# Patient Record
Sex: Male | Born: 1960 | Race: White | Hispanic: No | Marital: Married | Smoking: Former smoker
Health system: Southern US, Community
[De-identification: ages and names within clinical notes are randomized; demographics above are authoritative.]

## PROBLEM LIST (undated history)

## (undated) DIAGNOSIS — D649 Anemia, unspecified: Secondary | ICD-10-CM

## (undated) DIAGNOSIS — A4902 Methicillin resistant Staphylococcus aureus infection, unspecified site: Secondary | ICD-10-CM

## (undated) DIAGNOSIS — F32A Depression, unspecified: Secondary | ICD-10-CM

## (undated) DIAGNOSIS — K219 Gastro-esophageal reflux disease without esophagitis: Secondary | ICD-10-CM

## (undated) DIAGNOSIS — G4730 Sleep apnea, unspecified: Secondary | ICD-10-CM

## (undated) DIAGNOSIS — E119 Type 2 diabetes mellitus without complications: Secondary | ICD-10-CM

## (undated) DIAGNOSIS — N189 Chronic kidney disease, unspecified: Secondary | ICD-10-CM

## (undated) DIAGNOSIS — F329 Major depressive disorder, single episode, unspecified: Secondary | ICD-10-CM

## (undated) DIAGNOSIS — I10 Essential (primary) hypertension: Secondary | ICD-10-CM

## (undated) DIAGNOSIS — J449 Chronic obstructive pulmonary disease, unspecified: Secondary | ICD-10-CM

## (undated) DIAGNOSIS — E785 Hyperlipidemia, unspecified: Secondary | ICD-10-CM

## (undated) HISTORY — DX: Essential (primary) hypertension: I10

## (undated) HISTORY — DX: Chronic kidney disease, unspecified: N18.9

## (undated) HISTORY — PX: CHOLECYSTECTOMY: SHX55

## (undated) HISTORY — PX: OTHER SURGICAL HISTORY: SHX169

## (undated) HISTORY — PX: TRACHEOSTOMY: SUR1362

## (undated) HISTORY — DX: Type 2 diabetes mellitus without complications: E11.9

## (undated) HISTORY — DX: Sleep apnea, unspecified: G47.30

## (undated) HISTORY — PX: EXPLORATORY LAPAROTOMY: SUR591

## (undated) HISTORY — DX: Anemia, unspecified: D64.9

## (undated) HISTORY — DX: Hyperlipidemia, unspecified: E78.5

## (undated) HISTORY — DX: Chronic obstructive pulmonary disease, unspecified: J44.9

---

## 2007-01-31 ENCOUNTER — Ambulatory Visit: Payer: Self-pay | Admitting: Gastroenterology

## 2009-11-01 ENCOUNTER — Ambulatory Visit: Payer: Self-pay | Admitting: Specialist

## 2009-11-18 ENCOUNTER — Ambulatory Visit: Payer: Self-pay | Admitting: Specialist

## 2009-12-07 ENCOUNTER — Ambulatory Visit: Payer: Self-pay | Admitting: Specialist

## 2010-12-15 ENCOUNTER — Ambulatory Visit: Payer: Self-pay | Admitting: Urology

## 2013-03-19 ENCOUNTER — Ambulatory Visit: Payer: Self-pay | Admitting: Nurse Practitioner

## 2013-04-23 ENCOUNTER — Ambulatory Visit: Payer: Self-pay | Admitting: Surgery

## 2013-04-30 ENCOUNTER — Inpatient Hospital Stay: Payer: Self-pay | Admitting: Surgery

## 2013-04-30 LAB — PRO B NATRIURETIC PEPTIDE: B-Type Natriuretic Peptide: 50 pg/mL (ref 0–125)

## 2013-04-30 LAB — TROPONIN I
Troponin-I: <0.02 ng/mL
Troponin-I: <0.02 ng/mL

## 2013-04-30 LAB — CK TOTAL AND CKMB (NOT AT ARMC): CK-MB: <0.5 ng/mL — ABNORMAL LOW (ref 0.5–3.6)

## 2013-05-01 LAB — HEPATIC FUNCTION PANEL A (ARMC)
Albumin: 2.8 g/dL — ABNORMAL LOW (ref 3.4–5.0)
Alkaline Phosphatase: 79 U/L (ref 50–136)
Bilirubin, Direct: 0.1 mg/dL (ref 0.00–0.20)
Bilirubin,Total: 0.3 mg/dL (ref 0.2–1.0)
SGPT (ALT): 43 U/L (ref 12–78)

## 2013-05-01 LAB — CK-MB: CK-MB: <0.5 ng/mL — ABNORMAL LOW (ref 0.5–3.6)

## 2013-05-01 LAB — CK: CK, Total: 38 U/L (ref 35–232)

## 2013-05-01 LAB — CBC WITH DIFFERENTIAL/PLATELET
Eosinophil #: 0 10*3/uL (ref 0.0–0.7)
Eosinophil %: 0 %
Lymphocyte %: 4.3 %
MCH: 27.4 pg (ref 26.0–34.0)
Monocyte #: 0.7 x10 3/mm (ref 0.2–1.0)
Neutrophil #: 19.3 10*3/uL — ABNORMAL HIGH (ref 1.4–6.5)
Neutrophil %: 92.1 %
Platelet: 265 10*3/uL (ref 150–440)
RBC: 4.28 10*6/uL — ABNORMAL LOW (ref 4.40–5.90)
RDW: 15 % — ABNORMAL HIGH (ref 11.5–14.5)
WBC: 21 10*3/uL — ABNORMAL HIGH (ref 3.8–10.6)

## 2013-05-01 LAB — BASIC METABOLIC PANEL
BUN: 31 mg/dL — ABNORMAL HIGH (ref 7–18)
Chloride: 108 mmol/L — ABNORMAL HIGH (ref 98–107)
Co2: 21 mmol/L (ref 21–32)
Creatinine: 1.93 mg/dL — ABNORMAL HIGH (ref 0.60–1.30)
Osmolality: 283 (ref 275–301)
Potassium: 5 mmol/L (ref 3.5–5.1)

## 2013-05-01 LAB — MAGNESIUM: Magnesium: 1.8 mg/dL

## 2013-05-02 LAB — CBC WITH DIFFERENTIAL/PLATELET
Eosinophil #: 0 10*3/uL (ref 0.0–0.7)
HCT: 32.4 % — ABNORMAL LOW (ref 40.0–52.0)
HGB: 10.8 g/dL — ABNORMAL LOW (ref 13.0–18.0)
Lymphocyte %: 6 %
MCH: 27.4 pg (ref 26.0–34.0)
MCHC: 33.3 g/dL (ref 32.0–36.0)
Monocyte #: 1.3 x10 3/mm — ABNORMAL HIGH (ref 0.2–1.0)
Neutrophil #: 16.2 10*3/uL — ABNORMAL HIGH (ref 1.4–6.5)
Platelet: 241 10*3/uL (ref 150–440)
RBC: 3.93 10*6/uL — ABNORMAL LOW (ref 4.40–5.90)
WBC: 18.7 10*3/uL — ABNORMAL HIGH (ref 3.8–10.6)

## 2013-05-02 LAB — BASIC METABOLIC PANEL
Anion Gap: 6 — ABNORMAL LOW (ref 7–16)
BUN: 36 mg/dL — ABNORMAL HIGH (ref 7–18)
Calcium, Total: 8 mg/dL — ABNORMAL LOW (ref 8.5–10.1)
Chloride: 108 mmol/L — ABNORMAL HIGH (ref 98–107)
Creatinine: 1.98 mg/dL — ABNORMAL HIGH (ref 0.60–1.30)
EGFR (African American): 44 — ABNORMAL LOW
EGFR (Non-African Amer.): 38 — ABNORMAL LOW
Glucose: 209 mg/dL — ABNORMAL HIGH (ref 65–99)

## 2013-05-02 LAB — MAGNESIUM: Magnesium: 2.1 mg/dL

## 2013-05-03 LAB — BASIC METABOLIC PANEL
Anion Gap: 6 — ABNORMAL LOW (ref 7–16)
BUN: 40 mg/dL — ABNORMAL HIGH (ref 7–18)
Calcium, Total: 8.5 mg/dL (ref 8.5–10.1)
Chloride: 109 mmol/L — ABNORMAL HIGH (ref 98–107)
Co2: 22 mmol/L (ref 21–32)
EGFR (Non-African Amer.): 55 — ABNORMAL LOW
Glucose: 130 mg/dL — ABNORMAL HIGH (ref 65–99)
Potassium: 4.5 mmol/L (ref 3.5–5.1)
Sodium: 137 mmol/L (ref 136–145)

## 2013-05-03 LAB — PHOSPHORUS: Phosphorus: 2.7 mg/dL (ref 2.5–4.9)

## 2013-05-03 LAB — MAGNESIUM: Magnesium: 2.2 mg/dL

## 2013-05-04 LAB — CBC WITH DIFFERENTIAL/PLATELET
Basophil #: 0.1 10*3/uL (ref 0.0–0.1)
Eosinophil #: 0 10*3/uL (ref 0.0–0.7)
Eosinophil %: 0 %
HCT: 32.4 % — ABNORMAL LOW (ref 40.0–52.0)
Lymphocyte #: 1.1 10*3/uL (ref 1.0–3.6)
MCH: 27.5 pg (ref 26.0–34.0)
MCHC: 33.8 g/dL (ref 32.0–36.0)
MCV: 81 fL (ref 80–100)
Monocyte #: 1.6 x10 3/mm — ABNORMAL HIGH (ref 0.2–1.0)
Neutrophil %: 87.5 %
Platelet: 250 10*3/uL (ref 150–440)
RDW: 15.3 % — ABNORMAL HIGH (ref 11.5–14.5)
WBC: 22.8 10*3/uL — ABNORMAL HIGH (ref 3.8–10.6)

## 2013-05-04 LAB — HEPATIC FUNCTION PANEL A (ARMC)
Albumin: 2.4 g/dL — ABNORMAL LOW (ref 3.4–5.0)
Alkaline Phosphatase: 60 U/L (ref 50–136)
Bilirubin, Direct: 0.1 mg/dL (ref 0.00–0.20)
Bilirubin,Total: 0.3 mg/dL (ref 0.2–1.0)
SGOT(AST): 28 U/L (ref 15–37)
Total Protein: 6.5 g/dL (ref 6.4–8.2)

## 2013-05-04 LAB — BASIC METABOLIC PANEL
BUN: 45 mg/dL — ABNORMAL HIGH (ref 7–18)
Calcium, Total: 8.5 mg/dL (ref 8.5–10.1)
Chloride: 108 mmol/L — ABNORMAL HIGH (ref 98–107)
Co2: 24 mmol/L (ref 21–32)
Creatinine: 1.44 mg/dL — ABNORMAL HIGH (ref 0.60–1.30)
Glucose: 188 mg/dL — ABNORMAL HIGH (ref 65–99)
Osmolality: 292 (ref 275–301)
Potassium: 4.8 mmol/L (ref 3.5–5.1)
Sodium: 138 mmol/L (ref 136–145)

## 2013-05-05 LAB — CBC WITH DIFFERENTIAL/PLATELET
Basophil #: 0.1 10*3/uL (ref 0.0–0.1)
Basophil %: 0.5 %
Eosinophil #: 0.2 10*3/uL (ref 0.0–0.7)
Eosinophil %: 1.4 %
HCT: 32 % — ABNORMAL LOW (ref 40.0–52.0)
Lymphocyte #: 2.6 10*3/uL (ref 1.0–3.6)
Lymphocyte %: 15.8 %
MCH: 27.4 pg (ref 26.0–34.0)
MCHC: 33.7 g/dL (ref 32.0–36.0)
MCV: 81 fL (ref 80–100)
Monocyte #: 2 x10 3/mm — ABNORMAL HIGH (ref 0.2–1.0)
Neutrophil #: 11.7 10*3/uL — ABNORMAL HIGH (ref 1.4–6.5)
Platelet: 257 10*3/uL (ref 150–440)
RBC: 3.94 10*6/uL — ABNORMAL LOW (ref 4.40–5.90)
RDW: 15.5 % — ABNORMAL HIGH (ref 11.5–14.5)

## 2013-05-06 LAB — CBC WITH DIFFERENTIAL/PLATELET
Basophil %: 0.7 %
Eosinophil #: 0.4 10*3/uL (ref 0.0–0.7)
Eosinophil %: 3 %
HCT: 31.9 % — ABNORMAL LOW (ref 40.0–52.0)
MCH: 27.5 pg (ref 26.0–34.0)
MCHC: 34.2 g/dL (ref 32.0–36.0)
MCV: 81 fL (ref 80–100)
Monocyte %: 13.4 %
Neutrophil #: 9.3 10*3/uL — ABNORMAL HIGH (ref 1.4–6.5)
Neutrophil %: 62.3 %
Platelet: 267 10*3/uL (ref 150–440)
RBC: 3.96 10*6/uL — ABNORMAL LOW (ref 4.40–5.90)
WBC: 14.9 10*3/uL — ABNORMAL HIGH (ref 3.8–10.6)

## 2013-05-06 LAB — COMPREHENSIVE METABOLIC PANEL
Albumin: 2.2 g/dL — ABNORMAL LOW (ref 3.4–5.0)
Alkaline Phosphatase: 79 U/L (ref 50–136)
Anion Gap: 8 (ref 7–16)
BUN: 59 mg/dL — ABNORMAL HIGH (ref 7–18)
Bilirubin,Total: 0.5 mg/dL (ref 0.2–1.0)
Chloride: 106 mmol/L (ref 98–107)
Creatinine: 1.62 mg/dL — ABNORMAL HIGH (ref 0.60–1.30)
EGFR (African American): 56 — ABNORMAL LOW
EGFR (Non-African Amer.): 48 — ABNORMAL LOW
Glucose: 139 mg/dL — ABNORMAL HIGH (ref 65–99)
Potassium: 3.9 mmol/L (ref 3.5–5.1)
SGOT(AST): 42 U/L — ABNORMAL HIGH (ref 15–37)
Total Protein: 6.5 g/dL (ref 6.4–8.2)

## 2013-05-07 LAB — CBC WITH DIFFERENTIAL/PLATELET
Basophil #: 0.1 10*3/uL (ref 0.0–0.1)
Basophil %: 0.7 %
Eosinophil %: 3.1 %
HCT: 33.6 % — ABNORMAL LOW (ref 40.0–52.0)
HGB: 11.2 g/dL — ABNORMAL LOW (ref 13.0–18.0)
Lymphocyte #: 2.9 10*3/uL (ref 1.0–3.6)
Lymphocyte %: 20 %
MCH: 27 pg (ref 26.0–34.0)
MCHC: 33.4 g/dL (ref 32.0–36.0)
MCV: 81 fL (ref 80–100)
Monocyte #: 1.5 x10 3/mm — ABNORMAL HIGH (ref 0.2–1.0)
Neutrophil #: 9.4 10*3/uL — ABNORMAL HIGH (ref 1.4–6.5)
Neutrophil %: 65.6 %
RBC: 4.17 10*6/uL — ABNORMAL LOW (ref 4.40–5.90)
RDW: 15.1 % — ABNORMAL HIGH (ref 11.5–14.5)
WBC: 14.2 10*3/uL — ABNORMAL HIGH (ref 3.8–10.6)

## 2013-05-07 LAB — BASIC METABOLIC PANEL
BUN: 53 mg/dL — ABNORMAL HIGH (ref 7–18)
Calcium, Total: 8.7 mg/dL (ref 8.5–10.1)
Creatinine: 1.28 mg/dL (ref 0.60–1.30)
EGFR (African American): >60
Glucose: 125 mg/dL — ABNORMAL HIGH (ref 65–99)
Osmolality: 299 (ref 275–301)
Sodium: 142 mmol/L (ref 136–145)

## 2013-05-08 LAB — CBC WITH DIFFERENTIAL/PLATELET
Basophil #: 0.1 10*3/uL (ref 0.0–0.1)
Basophil %: 0.6 %
Eosinophil #: 0.4 10*3/uL (ref 0.0–0.7)
Eosinophil %: 3 %
HCT: 34.5 % — ABNORMAL LOW (ref 40.0–52.0)
HGB: 11.7 g/dL — ABNORMAL LOW (ref 13.0–18.0)
MCH: 27.1 pg (ref 26.0–34.0)
MCHC: 33.7 g/dL (ref 32.0–36.0)
MCV: 80 fL (ref 80–100)
Neutrophil %: 59.4 %
RDW: 14.6 % — ABNORMAL HIGH (ref 11.5–14.5)
WBC: 15 10*3/uL — ABNORMAL HIGH (ref 3.8–10.6)

## 2013-05-08 LAB — BASIC METABOLIC PANEL
Anion Gap: 8 (ref 7–16)
BUN: 55 mg/dL — ABNORMAL HIGH (ref 7–18)
Calcium, Total: 9 mg/dL (ref 8.5–10.1)
Chloride: 105 mmol/L (ref 98–107)
Co2: 27 mmol/L (ref 21–32)
EGFR (African American): >60
EGFR (Non-African Amer.): 52 — ABNORMAL LOW
Osmolality: 297 (ref 275–301)
Sodium: 140 mmol/L (ref 136–145)

## 2013-05-08 LAB — PHOSPHORUS: Phosphorus: 4.5 mg/dL (ref 2.5–4.9)

## 2013-05-09 LAB — BASIC METABOLIC PANEL
Anion Gap: 6 — ABNORMAL LOW (ref 7–16)
BUN: 68 mg/dL — ABNORMAL HIGH (ref 7–18)
Calcium, Total: 9 mg/dL (ref 8.5–10.1)
Chloride: 104 mmol/L (ref 98–107)
Co2: 28 mmol/L (ref 21–32)
Creatinine: 1.49 mg/dL — ABNORMAL HIGH (ref 0.60–1.30)
EGFR (Non-African Amer.): 53 — ABNORMAL LOW
Glucose: 211 mg/dL — ABNORMAL HIGH (ref 65–99)
Sodium: 138 mmol/L (ref 136–145)

## 2013-05-09 LAB — CBC WITH DIFFERENTIAL/PLATELET
Basophil #: 0.1 10*3/uL (ref 0.0–0.1)
Eosinophil #: 0.2 10*3/uL (ref 0.0–0.7)
HCT: 34.4 % — ABNORMAL LOW (ref 40.0–52.0)
MCH: 26.9 pg (ref 26.0–34.0)
MCV: 81 fL (ref 80–100)
Monocyte %: 7.5 %
Neutrophil #: 12.4 10*3/uL — ABNORMAL HIGH (ref 1.4–6.5)
Platelet: 281 10*3/uL (ref 150–440)
RBC: 4.26 10*6/uL — ABNORMAL LOW (ref 4.40–5.90)
RDW: 14.5 % (ref 11.5–14.5)
WBC: 16 10*3/uL — ABNORMAL HIGH (ref 3.8–10.6)

## 2013-05-10 LAB — CBC WITH DIFFERENTIAL/PLATELET
HCT: 33.2 % — ABNORMAL LOW (ref 40.0–52.0)
HGB: 11.2 g/dL — ABNORMAL LOW (ref 13.0–18.0)
Lymphocyte #: 1.7 10*3/uL (ref 1.0–3.6)
MCH: 27.3 pg (ref 26.0–34.0)
MCHC: 33.7 g/dL (ref 32.0–36.0)
MCV: 81 fL (ref 80–100)
Monocyte #: 1 x10 3/mm (ref 0.2–1.0)
Monocyte %: 6.4 %
Neutrophil #: 13.1 10*3/uL — ABNORMAL HIGH (ref 1.4–6.5)
Neutrophil %: 80.3 %
RDW: 14.7 % — ABNORMAL HIGH (ref 11.5–14.5)
WBC: 16.3 10*3/uL — ABNORMAL HIGH (ref 3.8–10.6)

## 2013-05-10 LAB — BASIC METABOLIC PANEL
Chloride: 106 mmol/L (ref 98–107)
Co2: 26 mmol/L (ref 21–32)
Creatinine: 1.45 mg/dL — ABNORMAL HIGH (ref 0.60–1.30)
EGFR (African American): >60
Osmolality: 303 (ref 275–301)
Potassium: 4.1 mmol/L (ref 3.5–5.1)
Sodium: 138 mmol/L (ref 136–145)

## 2013-05-10 LAB — EXPECTORATED SPUTUM ASSESSMENT W GRAM STAIN, RFLX TO RESP C

## 2013-05-10 LAB — PHOSPHORUS: Phosphorus: 3 mg/dL (ref 2.5–4.9)

## 2013-05-11 LAB — BASIC METABOLIC PANEL
Anion Gap: 6 — ABNORMAL LOW (ref 7–16)
BUN: 44 mg/dL — ABNORMAL HIGH (ref 7–18)
Calcium, Total: 8.3 mg/dL — ABNORMAL LOW (ref 8.5–10.1)
Chloride: 106 mmol/L (ref 98–107)
Co2: 26 mmol/L (ref 21–32)
EGFR (African American): >60
EGFR (Non-African Amer.): >60
Glucose: 229 mg/dL — ABNORMAL HIGH (ref 65–99)
Potassium: 3.8 mmol/L (ref 3.5–5.1)
Sodium: 138 mmol/L (ref 136–145)

## 2013-05-11 LAB — CBC WITH DIFFERENTIAL/PLATELET
Basophil #: 0.1 10*3/uL (ref 0.0–0.1)
Basophil %: 0.8 %
Eosinophil #: 0.5 10*3/uL (ref 0.0–0.7)
HGB: 11 g/dL — ABNORMAL LOW (ref 13.0–18.0)
Lymphocyte #: 2.1 10*3/uL (ref 1.0–3.6)
MCH: 27 pg (ref 26.0–34.0)
MCV: 81 fL (ref 80–100)
Monocyte %: 6.7 %
Neutrophil #: 13.2 10*3/uL — ABNORMAL HIGH (ref 1.4–6.5)
Neutrophil %: 77.3 %
RBC: 4.08 10*6/uL — ABNORMAL LOW (ref 4.40–5.90)
WBC: 17.1 10*3/uL — ABNORMAL HIGH (ref 3.8–10.6)

## 2013-05-11 LAB — MAGNESIUM: Magnesium: 1.8 mg/dL

## 2013-05-11 LAB — TRIGLYCERIDES: Triglycerides: 511 mg/dL — ABNORMAL HIGH (ref 0–200)

## 2013-05-12 LAB — CREATININE, SERUM: EGFR (Non-African Amer.): >60

## 2013-05-12 LAB — CALCIUM: Calcium, Total: 8.5 mg/dL (ref 8.5–10.1)

## 2013-05-12 LAB — MAGNESIUM: Magnesium: 1.7 mg/dL — ABNORMAL LOW

## 2013-05-13 LAB — CBC WITH DIFFERENTIAL/PLATELET
Basophil #: 0.1 10*3/uL (ref 0.0–0.1)
Basophil %: 0.7 %
Eosinophil #: 0.4 10*3/uL (ref 0.0–0.7)
Eosinophil %: 2.3 %
HCT: 30.8 % — ABNORMAL LOW (ref 40.0–52.0)
HGB: 10.1 g/dL — ABNORMAL LOW (ref 13.0–18.0)
Lymphocyte #: 2.8 10*3/uL (ref 1.0–3.6)
Lymphocyte %: 16.9 %
MCH: 26.8 pg (ref 26.0–34.0)
MCHC: 32.9 g/dL (ref 32.0–36.0)
MCV: 82 fL (ref 80–100)
Monocyte #: 1.3 x10 3/mm — ABNORMAL HIGH (ref 0.2–1.0)
Monocyte %: 7.6 %
Neutrophil #: 12.1 10*3/uL — ABNORMAL HIGH (ref 1.4–6.5)
Neutrophil %: 72.5 %
Platelet: 266 10*3/uL (ref 150–440)
RBC: 3.78 10*6/uL — ABNORMAL LOW (ref 4.40–5.90)
RDW: 14.9 % — ABNORMAL HIGH (ref 11.5–14.5)
WBC: 16.7 10*3/uL — ABNORMAL HIGH (ref 3.8–10.6)

## 2013-05-13 LAB — PHOSPHORUS: Phosphorus: 3.8 mg/dL (ref 2.5–4.9)

## 2013-05-13 LAB — MAGNESIUM: Magnesium: 2 mg/dL

## 2013-05-13 LAB — BASIC METABOLIC PANEL
BUN: 41 mg/dL — ABNORMAL HIGH (ref 7–18)
Calcium, Total: 8.6 mg/dL (ref 8.5–10.1)
Chloride: 108 mmol/L — ABNORMAL HIGH (ref 98–107)
Co2: 24 mmol/L (ref 21–32)
Creatinine: 1.15 mg/dL (ref 0.60–1.30)
EGFR (African American): >60
Glucose: 154 mg/dL — ABNORMAL HIGH (ref 65–99)
Sodium: 140 mmol/L (ref 136–145)

## 2013-05-13 LAB — VANCOMYCIN, TROUGH: Vancomycin, Trough: 16 ug/mL (ref 10–20)

## 2013-05-14 LAB — BASIC METABOLIC PANEL
Anion Gap: 6 — ABNORMAL LOW (ref 7–16)
BUN: 37 mg/dL — ABNORMAL HIGH (ref 7–18)
Calcium, Total: 8.6 mg/dL (ref 8.5–10.1)
Chloride: 107 mmol/L (ref 98–107)
Co2: 27 mmol/L (ref 21–32)
Creatinine: 1.15 mg/dL (ref 0.60–1.30)
EGFR (African American): >60
EGFR (Non-African Amer.): >60
Osmolality: 291 (ref 275–301)
Potassium: 3.7 mmol/L (ref 3.5–5.1)
Sodium: 140 mmol/L (ref 136–145)

## 2013-05-14 LAB — MAGNESIUM: Magnesium: 1.8 mg/dL

## 2013-05-15 LAB — BASIC METABOLIC PANEL
Anion Gap: 10 (ref 7–16)
BUN: 34 mg/dL — ABNORMAL HIGH (ref 7–18)
Calcium, Total: 9 mg/dL (ref 8.5–10.1)
Creatinine: 1.24 mg/dL (ref 0.60–1.30)
EGFR (African American): >60
Osmolality: 307 (ref 275–301)
Potassium: 3.5 mmol/L (ref 3.5–5.1)
Sodium: 141 mmol/L (ref 136–145)

## 2013-05-15 LAB — MAGNESIUM: Magnesium: 2 mg/dL

## 2013-05-15 LAB — PHOSPHORUS: Phosphorus: 3 mg/dL (ref 2.5–4.9)

## 2013-05-16 LAB — BASIC METABOLIC PANEL
Anion Gap: 7 (ref 7–16)
Calcium, Total: 9.1 mg/dL (ref 8.5–10.1)
Co2: 27 mmol/L (ref 21–32)
Creatinine: 1.26 mg/dL (ref 0.60–1.30)
EGFR (Non-African Amer.): >60
Glucose: 248 mg/dL — ABNORMAL HIGH (ref 65–99)
Osmolality: 304 (ref 275–301)

## 2013-05-16 LAB — PHOSPHORUS: Phosphorus: 2.2 mg/dL — ABNORMAL LOW (ref 2.5–4.9)

## 2013-05-16 LAB — MAGNESIUM: Magnesium: 2.1 mg/dL

## 2013-05-16 LAB — VANCOMYCIN, TROUGH: Vancomycin, Trough: 19 ug/mL (ref 10–20)

## 2013-05-17 LAB — CBC WITH DIFFERENTIAL/PLATELET
Eosinophil #: 0.2 10*3/uL (ref 0.0–0.7)
HCT: 31.3 % — ABNORMAL LOW (ref 40.0–52.0)
HGB: 10.2 g/dL — ABNORMAL LOW (ref 13.0–18.0)
Lymphocyte #: 2 10*3/uL (ref 1.0–3.6)
MCH: 26.8 pg (ref 26.0–34.0)
MCHC: 32.5 g/dL (ref 32.0–36.0)
MCV: 83 fL (ref 80–100)
Monocyte %: 8.3 %
Neutrophil %: 75.5 %

## 2013-05-17 LAB — CALCIUM: Calcium, Total: 9 mg/dL (ref 8.5–10.1)

## 2013-05-17 LAB — PHOSPHORUS: Phosphorus: 3.2 mg/dL (ref 2.5–4.9)

## 2013-05-17 LAB — SODIUM: Sodium: 146 mmol/L — ABNORMAL HIGH (ref 136–145)

## 2013-05-18 LAB — CALCIUM: Calcium, Total: 8.8 mg/dL (ref 8.5–10.1)

## 2013-05-18 LAB — CREATININE, SERUM
Creatinine: 1.25 mg/dL (ref 0.60–1.30)
EGFR (African American): >60
EGFR (Non-African Amer.): >60

## 2013-05-18 LAB — SODIUM: Sodium: 145 mmol/L (ref 136–145)

## 2013-05-18 LAB — SEDIMENTATION RATE: Erythrocyte Sed Rate: 106 mm/hr — ABNORMAL HIGH (ref 0–20)

## 2013-05-18 LAB — FOLATE: Folic Acid: 19.8 ng/mL (ref 3.1–100.0)

## 2013-05-19 LAB — CBC WITH DIFFERENTIAL/PLATELET
Eosinophil #: 0.5 10*3/uL (ref 0.0–0.7)
HCT: 31.2 % — ABNORMAL LOW (ref 40.0–52.0)
HGB: 10.6 g/dL — ABNORMAL LOW (ref 13.0–18.0)
Lymphocyte #: 2.2 10*3/uL (ref 1.0–3.6)
MCH: 27.7 pg (ref 26.0–34.0)
MCHC: 34.1 g/dL (ref 32.0–36.0)
MCV: 81 fL (ref 80–100)
Neutrophil %: 74.2 %
Platelet: 238 10*3/uL (ref 150–440)
RDW: 15 % — ABNORMAL HIGH (ref 11.5–14.5)
WBC: 14.2 10*3/uL — ABNORMAL HIGH (ref 3.8–10.6)

## 2013-05-19 LAB — BASIC METABOLIC PANEL
Anion Gap: 7 (ref 7–16)
BUN: 41 mg/dL — ABNORMAL HIGH (ref 7–18)
Calcium, Total: 9 mg/dL (ref 8.5–10.1)
Chloride: 108 mmol/L — ABNORMAL HIGH (ref 98–107)
Co2: 27 mmol/L (ref 21–32)
Creatinine: 1.19 mg/dL (ref 0.60–1.30)
EGFR (African American): >60
EGFR (Non-African Amer.): >60
Osmolality: 293 (ref 275–301)
Sodium: 142 mmol/L (ref 136–145)

## 2013-05-19 LAB — PHOSPHORUS: Phosphorus: 3.2 mg/dL (ref 2.5–4.9)

## 2013-05-19 LAB — MAGNESIUM: Magnesium: 2 mg/dL

## 2013-05-20 LAB — HEPATIC FUNCTION PANEL A (ARMC)
Albumin: 1.9 g/dL — ABNORMAL LOW (ref 3.4–5.0)
Alkaline Phosphatase: 91 U/L (ref 50–136)
Bilirubin,Total: 0.5 mg/dL (ref 0.2–1.0)
SGOT(AST): 12 U/L — ABNORMAL LOW (ref 15–37)

## 2013-05-20 LAB — CBC WITH DIFFERENTIAL/PLATELET
Basophil #: 0.1 10*3/uL (ref 0.0–0.1)
Basophil %: 0.9 %
Eosinophil %: 2.7 %
Monocyte %: 8 %
Neutrophil #: 9.8 10*3/uL — ABNORMAL HIGH (ref 1.4–6.5)
RBC: 3.8 10*6/uL — ABNORMAL LOW (ref 4.40–5.90)
WBC: 13.2 10*3/uL — ABNORMAL HIGH (ref 3.8–10.6)

## 2013-05-20 LAB — BASIC METABOLIC PANEL
Anion Gap: 6 — ABNORMAL LOW (ref 7–16)
BUN: 38 mg/dL — ABNORMAL HIGH (ref 7–18)
Chloride: 104 mmol/L (ref 98–107)
Creatinine: 1.17 mg/dL (ref 0.60–1.30)
EGFR (African American): >60
EGFR (Non-African Amer.): >60
Glucose: 150 mg/dL — ABNORMAL HIGH (ref 65–99)
Osmolality: 289 (ref 275–301)
Potassium: 3.5 mmol/L (ref 3.5–5.1)

## 2013-05-20 LAB — PHOSPHORUS: Phosphorus: 3.6 mg/dL (ref 2.5–4.9)

## 2013-05-20 LAB — MAGNESIUM: Magnesium: 2 mg/dL

## 2013-05-21 LAB — CBC WITH DIFFERENTIAL/PLATELET
Basophil %: 0.8 %
HGB: 9.3 g/dL — ABNORMAL LOW (ref 13.0–18.0)
Lymphocyte #: 2.5 10*3/uL (ref 1.0–3.6)
Lymphocyte %: 13.7 %
MCH: 27.2 pg (ref 26.0–34.0)
MCHC: 33.3 g/dL (ref 32.0–36.0)
Neutrophil #: 13.6 10*3/uL — ABNORMAL HIGH (ref 1.4–6.5)
Neutrophil %: 74.2 %
WBC: 18.3 10*3/uL — ABNORMAL HIGH (ref 3.8–10.6)

## 2013-05-21 LAB — URINE CULTURE

## 2013-05-22 LAB — CBC WITH DIFFERENTIAL/PLATELET
Basophil #: 0.2 10*3/uL — ABNORMAL HIGH (ref 0.0–0.1)
Basophil %: 0.9 %
Eosinophil #: 0.3 10*3/uL (ref 0.0–0.7)
Lymphocyte %: 9.6 %
MCH: 27.1 pg (ref 26.0–34.0)
MCV: 82 fL (ref 80–100)
Monocyte #: 1.9 x10 3/mm — ABNORMAL HIGH (ref 0.2–1.0)
Monocyte %: 10.8 %
RDW: 15.2 % — ABNORMAL HIGH (ref 11.5–14.5)
WBC: 17.2 10*3/uL — ABNORMAL HIGH (ref 3.8–10.6)

## 2013-05-22 LAB — EXPECTORATED SPUTUM ASSESSMENT W GRAM STAIN, RFLX TO RESP C

## 2013-05-22 LAB — BASIC METABOLIC PANEL
Anion Gap: 9 (ref 7–16)
Anion Gap: 11 (ref 7–16)
Calcium, Total: 8.3 mg/dL — ABNORMAL LOW (ref 8.5–10.1)
Calcium, Total: 7.2 mg/dL — ABNORMAL LOW (ref 8.5–10.1)
Chloride: 100 mmol/L (ref 98–107)
Chloride: 91 mmol/L — ABNORMAL LOW (ref 98–107)
Co2: 25 mmol/L (ref 21–32)
Co2: 24 mmol/L (ref 21–32)
Creatinine: 1.79 mg/dL — ABNORMAL HIGH (ref 0.60–1.30)
Creatinine: 1.7 mg/dL — ABNORMAL HIGH (ref 0.60–1.30)
EGFR (African American): 49 — ABNORMAL LOW
EGFR (African American): 53 — ABNORMAL LOW
EGFR (Non-African Amer.): 43 — ABNORMAL LOW
Glucose: 317 mg/dL — ABNORMAL HIGH (ref 65–99)
Osmolality: 282 (ref 275–301)
Potassium: 3.7 mmol/L (ref 3.5–5.1)
Sodium: 134 mmol/L — ABNORMAL LOW (ref 136–145)
Sodium: 126 mmol/L — ABNORMAL LOW (ref 136–145)

## 2013-05-22 LAB — PHOSPHORUS: Phosphorus: 5.4 mg/dL — ABNORMAL HIGH (ref 2.5–4.9)

## 2013-05-22 LAB — CULTURE, BLOOD (SINGLE)

## 2013-05-22 LAB — TRIGLYCERIDES: Triglycerides: 264 mg/dL — ABNORMAL HIGH (ref 0–200)

## 2013-05-23 LAB — CBC WITH DIFFERENTIAL/PLATELET
Basophil %: 1.1 %
Eosinophil %: 4.6 %
HCT: 27.9 % — ABNORMAL LOW (ref 40.0–52.0)
HGB: 9.3 g/dL — ABNORMAL LOW (ref 13.0–18.0)
Lymphocyte #: 2.5 10*3/uL (ref 1.0–3.6)
Lymphocyte %: 19.3 %
MCH: 27 pg (ref 26.0–34.0)
MCHC: 33.4 g/dL (ref 32.0–36.0)
MCV: 81 fL (ref 80–100)
Monocyte #: 1.4 x10 3/mm — ABNORMAL HIGH (ref 0.2–1.0)
Monocyte %: 10.9 %
Neutrophil #: 8.2 10*3/uL — ABNORMAL HIGH (ref 1.4–6.5)
Neutrophil %: 64.1 %
Platelet: 286 10*3/uL (ref 150–440)
RBC: 3.44 10*6/uL — ABNORMAL LOW (ref 4.40–5.90)
RDW: 15 % — ABNORMAL HIGH (ref 11.5–14.5)

## 2013-05-23 LAB — BASIC METABOLIC PANEL
Anion Gap: 8 (ref 7–16)
Calcium, Total: 8.1 mg/dL — ABNORMAL LOW (ref 8.5–10.1)
Chloride: 102 mmol/L (ref 98–107)
EGFR (African American): 57 — ABNORMAL LOW
EGFR (Non-African Amer.): 49 — ABNORMAL LOW
Osmolality: 298 (ref 275–301)
Potassium: 3.9 mmol/L (ref 3.5–5.1)
Sodium: 136 mmol/L (ref 136–145)

## 2013-05-23 LAB — EXPECTORATED SPUTUM ASSESSMENT W GRAM STAIN, RFLX TO RESP C

## 2013-05-23 LAB — PHOSPHORUS: Phosphorus: 3.7 mg/dL (ref 2.5–4.9)

## 2013-05-24 LAB — CBC WITH DIFFERENTIAL/PLATELET
Basophil #: 0.2 10*3/uL — ABNORMAL HIGH (ref 0.0–0.1)
Basophil %: 1.5 %
Eosinophil %: 6.1 %
HCT: 25.1 % — ABNORMAL LOW (ref 40.0–52.0)
HGB: 8.4 g/dL — ABNORMAL LOW (ref 13.0–18.0)
Lymphocyte #: 2.3 10*3/uL (ref 1.0–3.6)
Lymphocyte %: 21.6 %
MCH: 27.3 pg (ref 26.0–34.0)
MCV: 81 fL (ref 80–100)
Monocyte %: 10.5 %
Platelet: 244 10*3/uL (ref 150–440)
RBC: 3.1 10*6/uL — ABNORMAL LOW (ref 4.40–5.90)
RDW: 15.5 % — ABNORMAL HIGH (ref 11.5–14.5)
WBC: 10.6 10*3/uL (ref 3.8–10.6)

## 2013-05-24 LAB — MAGNESIUM: Magnesium: 2.2 mg/dL

## 2013-05-24 LAB — BASIC METABOLIC PANEL
Anion Gap: 7 (ref 7–16)
BUN: 57 mg/dL — ABNORMAL HIGH (ref 7–18)
Creatinine: 1.43 mg/dL — ABNORMAL HIGH (ref 0.60–1.30)
EGFR (Non-African Amer.): 56 — ABNORMAL LOW

## 2013-05-24 LAB — CALCIUM: Calcium, Total: 8.4 mg/dL — ABNORMAL LOW (ref 8.5–10.1)

## 2013-05-24 LAB — POTASSIUM: Potassium: 4.3 mmol/L (ref 3.5–5.1)

## 2013-05-25 LAB — VANCOMYCIN, TROUGH: Vancomycin, Trough: 25 ug/mL (ref 10–20)

## 2013-05-26 ENCOUNTER — Ambulatory Visit (HOSPITAL_COMMUNITY)
Admission: AD | Admit: 2013-05-26 | Discharge: 2013-05-26 | Disposition: A | Discharge status: Home / Self Care | Payer: BC Managed Care – PPO | Source: Other Acute Inpatient Hospital | Attending: Internal Medicine | Admitting: Internal Medicine

## 2013-05-26 DIAGNOSIS — J9600 Acute respiratory failure, unspecified whether with hypoxia or hypercapnia: Secondary | ICD-10-CM | POA: Insufficient documentation

## 2013-05-26 DIAGNOSIS — Z9911 Dependence on respirator [ventilator] status: Secondary | ICD-10-CM | POA: Insufficient documentation

## 2013-05-26 LAB — BASIC METABOLIC PANEL
Anion Gap: 5 — ABNORMAL LOW (ref 7–16)
EGFR (Non-African Amer.): >60
Glucose: 96 mg/dL (ref 65–99)
Osmolality: 288 (ref 275–301)
Potassium: 4.3 mmol/L (ref 3.5–5.1)
Sodium: 141 mmol/L (ref 136–145)

## 2013-05-26 LAB — PHOSPHORUS: Phosphorus: 4.6 mg/dL (ref 2.5–4.9)

## 2013-05-26 LAB — MAGNESIUM: Magnesium: 1.9 mg/dL

## 2013-05-26 LAB — HEMOGLOBIN: HGB: 8.4 g/dL — ABNORMAL LOW (ref 13.0–18.0)

## 2013-07-20 ENCOUNTER — Encounter: Payer: Self-pay | Admitting: Surgery

## 2013-07-25 ENCOUNTER — Encounter: Payer: Self-pay | Admitting: Surgery

## 2013-07-28 ENCOUNTER — Ambulatory Visit (INDEPENDENT_AMBULATORY_CARE_PROVIDER_SITE_OTHER): Payer: BC Managed Care – PPO | Admitting: Pulmonary Disease

## 2013-07-28 ENCOUNTER — Encounter: Payer: Self-pay | Admitting: Pulmonary Disease

## 2013-07-28 VITALS — BP 142/78 | HR 78 | Ht 67.0 in | Wt 232.1 lb

## 2013-07-28 DIAGNOSIS — J449 Chronic obstructive pulmonary disease, unspecified: Secondary | ICD-10-CM

## 2013-07-28 DIAGNOSIS — R0902 Hypoxemia: Secondary | ICD-10-CM

## 2013-07-28 DIAGNOSIS — Z930 Tracheostomy status: Secondary | ICD-10-CM | POA: Insufficient documentation

## 2013-07-28 DIAGNOSIS — J189 Pneumonia, unspecified organism: Secondary | ICD-10-CM | POA: Insufficient documentation

## 2013-07-28 DIAGNOSIS — G4734 Idiopathic sleep related nonobstructive alveolar hypoventilation: Secondary | ICD-10-CM | POA: Insufficient documentation

## 2013-07-28 DIAGNOSIS — Z23 Encounter for immunization: Secondary | ICD-10-CM

## 2013-07-28 NOTE — Assessment & Plan Note (Signed)
He will continue to followup with Dr. Wyn Forster office for this and his vocal cord paralysis.

## 2013-07-28 NOTE — Assessment & Plan Note (Signed)
Continue 2 L each bedtime

## 2013-07-28 NOTE — Assessment & Plan Note (Signed)
Matthew Mayer is doing amazingly well despite the lengthy hospitalization and ventilator course from which she has recently recovered. In fact, I told him today he is an inspiration to those of [place] who care for patients in chronic ventilator facilities because his recovery is clearly remarkable.  His COPD was best characterized with pulmonary function testing prior to the recent hospitalization. I will need to get lung function test from Dr. Reita Cliche office because I cannot perform this with a tracheostomy in place.  At this point he is doing well and I see no indication to change his medicines  Plan: -Continue Spiriva and Symbicort -Flu shot today -Continue oxygen at night -Followup 3 months

## 2013-07-28 NOTE — Assessment & Plan Note (Signed)
He is recovering from this. We will get a chest x-ray to ensure complete resolution of the findings noted in August.

## 2013-07-28 NOTE — Progress Notes (Signed)
Subjective:    Patient ID: Matthew Mayer, male    DOB: 04-15-1961, 52 y.o.   MRN: [id number]  HPI  This is a very pleasant 52 year old male with a past medical history significant for COPD who comes to our clinic today for evaluation of the same. He previously worked as a Naval architect and smokes 3-4 packs of cigarettes daily for 38 years. In August of 2014 he underwent a laparoscopic cholecystectomy and after that had respiratory failure and cardiac arrest. He was hospitalized at [facility] where a tracheostomy was placed on ventilator day 13. He was then discharged to [facility] where he stayed on the ventilator for upwards of 7 weeks. [facility]. During his lengthy hospital stay complications included a cardiac arrest after his tracheostomy had become dislodged. He also had MRSA pneumonia. It was noted at the time of attempted decannulation prior to his hospital discharge that he had complete vocal cord paralysis. His tracheostomy was placed by: Harriett Sine her nose and throat and he continues to follow with them. Since hospital discharge she has been getting progressively stronger. He does not need to use oxygen during the day. His voice is perhaps a bit more hoarse since discharge. He still has cough productive of thick mucus on occasion. Overall, he notes that it is a lot better than prior to his hospitalization. He has not smoked a cigarette since August of 2014. He uses 2 L of oxygen at night. He uses Spiriva daily, Symbicort daily (not twice a day) and albuterol once or twice a day.    Past Medical History  Diagnosis Date  . Hypertension   . Hyperlipidemia   . Diabetes   . Sleep apnea   . COPD (chronic obstructive pulmonary disease)      No family history on file.   History   Social History  . Marital Status: Married    Spouse Name: N/A    Number of Children: N/A  . Years of Education: N/A   Occupational History  . Not on file.   Social  History Main Topics  . Smoking status: Former Smoker -- 3.00 packs/day for 38 years    Types: Cigarettes    Quit date: 04/30/2013  . Smokeless tobacco: Not on file  . Alcohol Use: Yes     Comment: occasional beer  . Drug Use: No  . Sexual Activity: Not on file   Other Topics Concern  . Not on file   Social History Narrative  . No narrative on file     Allergies  Allergen Reactions  . Augmentin [Amoxicillin-Pot Clavulanate]   . Biaxin [Clarithromycin]   . Codeine   . Penicillins   . Percocet [Oxycodone-Acetaminophen]      No outpatient prescriptions prior to visit.   No facility-administered medications prior to visit.      Review of Systems  Constitutional: Positive for unexpected weight change. Negative for fever, chills, diaphoresis, activity change, appetite change and fatigue.  HENT: Negative for congestion, dental problem, ear discharge, ear pain, facial swelling, hearing loss, mouth sores, nosebleeds, postnasal drip, rhinorrhea, sinus pressure, sneezing, sore throat, tinnitus, trouble swallowing and voice change.   Eyes: Negative for photophobia, discharge, itching and visual disturbance.  Respiratory: Positive for cough and shortness of breath. Negative for apnea, choking, chest tightness, wheezing and stridor.   Cardiovascular: Negative for chest pain, palpitations and leg swelling.  Gastrointestinal: Negative for nausea, vomiting, abdominal pain, constipation, blood in stool and abdominal distention.  Genitourinary: Negative for dysuria, urgency, frequency, hematuria, flank pain, decreased urine volume and difficulty urinating.  Musculoskeletal: Positive for joint swelling. Negative for arthralgias, back pain, gait problem, myalgias, neck pain and neck stiffness.  Skin: Negative for color change, pallor and rash.  Neurological: Negative for dizziness, tremors, seizures, syncope, speech difficulty, weakness, light-headedness, numbness and headaches.  Hematological:  Negative for adenopathy. Does not bruise/bleed easily.  Psychiatric/Behavioral: Positive for dysphoric mood. Negative for confusion, sleep disturbance and agitation. The patient is nervous/anxious.        Objective:   Physical Exam  Filed Vitals:   07/28/13 1140  BP: 142/78  Pulse: 78  Height: 5\' 7"  (1.702 m)  Weight: 232 lb 1.9 oz (105.289 kg)  SpO2: 98%  RA  Gen: chronically ill appearing, no acute distress HEENT: NCAT, PERRL, EOMi, OP clear, tracheostomy site c/d/i PULM: CTA B CV: RRR, no mgr, no JVD AB: BS+, soft, nontender, no hsm Ext: warm, no edema, no clubbing, no cyanosis Derm: no rash or skin breakdown Neuro: A&Ox4, CN II-XII intact, strength 5/5 in all 4 extremities  August 2014 chest x-ray reviewed with by basilar infiltrates August 2014 CT chest reviewed> no emphysema noted, by basilar airspace disease noted.     Assessment & Plan:   COPD (chronic obstructive pulmonary disease) Mr. Oguinn is doing amazingly well despite the lengthy hospitalization and ventilator course from which she has recently recovered. In fact, I told him today he is an inspiration to those of [place] who care for patients in chronic ventilator facilities because his recovery is clearly remarkable.  His COPD was best characterized with pulmonary function testing prior to the recent hospitalization. I will need to get lung function test from Dr. Reita Cliche office because I cannot perform this with a tracheostomy in place.  At this point he is doing well and I see no indication to change his medicines  Plan: -Continue Spiriva and Symbicort -Flu shot today -Continue oxygen at night -Followup 3 months  Tracheostomy status He will continue to followup with Dr. Wyn Forster office for this and his vocal cord paralysis.  Nocturnal hypoxemia Continue 2 L each bedtime  HCAP (healthcare-associated pneumonia) He is recovering from this. We will get a chest x-ray to ensure complete resolution of the  findings noted in August.   Updated Medication List Outpatient Encounter Prescriptions as of 07/28/2013  Medication Sig  . albuterol-ipratropium (COMBIVENT) 18-103 MCG/ACT inhaler Inhale 2 puffs into the lungs every 6 (six) hours as needed for wheezing or shortness of breath.  Marland Kitchen aspirin 81 MG tablet Take 81 mg by mouth daily.  Marland Kitchen atorvastatin (LIPITOR) 10 MG tablet Take 10 mg by mouth daily.  Marland Kitchen dexamethasone (DECADRON) 1 MG tablet Take 0.5 mg by mouth daily with breakfast.   . DULoxetine (CYMBALTA) 60 MG capsule Take 60 mg by mouth daily.  Marland Kitchen esomeprazole (NEXIUM) 40 MG capsule Take 40 mg by mouth daily at 12 noon.  . fentaNYL (DURAGESIC - DOSED MCG/HR) 25 MCG/HR patch Place 25 mcg onto the skin every 3 (three) days.  . furosemide (LASIX) 20 MG tablet Take 20 mg by mouth daily.  Marland Kitchen guaiFENesin (MUCINEX) 600 MG 12 hr tablet Take 1,200 mg by mouth 2 (two) times daily.  . insulin detemir (LEVEMIR) 100 UNIT/ML injection Inject 30 Units into the skin daily.  . insulin lispro (HUMALOG) 100 UNIT/ML injection Inject 20 Units into the skin 3 (three) times daily with meals.  Marland Kitchen lisinopril (PRINIVIL,ZESTRIL) 20 MG tablet Take 20 mg by mouth  daily.  . Multiple Vitamin (MULTIVITAMIN) tablet Take 1 tablet by mouth daily.  . Oxycodone HCl 10 MG TABS Take 10 mg by mouth daily as needed.  . sitaGLIPtin-metformin (JANUMET) 50-1000 MG per tablet Take 1 tablet by mouth daily.  . [DISCONTINUED] gabapentin (NEURONTIN) 300 MG capsule Take 300 mg by mouth 2 (two) times daily.

## 2013-07-28 NOTE — Patient Instructions (Signed)
We will request records from Dr. Lillette Boxer office for you We will set up a Chest X-ray at [facility] Keep using your medicines as you are doing Keep using your oxygen as you are doing We will see you back in 3 months or sooner if needed

## 2013-07-30 ENCOUNTER — Ambulatory Visit: Payer: Self-pay | Admitting: Pulmonary Disease

## 2013-08-12 ENCOUNTER — Encounter: Payer: Self-pay | Admitting: Pulmonary Disease

## 2013-08-12 ENCOUNTER — Telehealth: Payer: Self-pay

## 2013-08-12 DIAGNOSIS — J3800 Paralysis of vocal cords and larynx, unspecified: Secondary | ICD-10-CM | POA: Insufficient documentation

## 2013-08-12 NOTE — Telephone Encounter (Signed)
Message copied by Velvet Bathe on Wed Aug 12, 2013  9:23 AM ------      Message from: Max Fickle B      Created: Wed Aug 12, 2013  9:20 AM       A,            Please let him know that his CXR looks good.            Thanks      B ------

## 2013-08-12 NOTE — Telephone Encounter (Signed)
Spoke w/pt, he is aware of cxr results, will continue with recommendations made in his last ov.  No further action required. [name],[name]

## 2013-08-24 ENCOUNTER — Encounter: Payer: Self-pay | Admitting: Surgery

## 2013-08-27 ENCOUNTER — Encounter: Payer: Self-pay | Admitting: Pulmonary Disease

## 2013-09-24 ENCOUNTER — Encounter: Payer: Self-pay | Admitting: Surgery

## 2013-10-25 ENCOUNTER — Encounter: Payer: Self-pay | Admitting: Surgery

## 2013-11-27 DIAGNOSIS — E669 Obesity, unspecified: Secondary | ICD-10-CM | POA: Insufficient documentation

## 2013-11-27 DIAGNOSIS — K219 Gastro-esophageal reflux disease without esophagitis: Secondary | ICD-10-CM | POA: Insufficient documentation

## 2013-11-27 DIAGNOSIS — Z930 Tracheostomy status: Secondary | ICD-10-CM | POA: Insufficient documentation

## 2013-12-04 HISTORY — PX: OTHER SURGICAL HISTORY: SHX169

## 2014-01-11 DIAGNOSIS — J386 Stenosis of larynx: Secondary | ICD-10-CM | POA: Insufficient documentation

## 2014-04-05 DIAGNOSIS — R600 Localized edema: Secondary | ICD-10-CM | POA: Insufficient documentation

## 2014-05-13 DIAGNOSIS — E8881 Metabolic syndrome: Secondary | ICD-10-CM | POA: Insufficient documentation

## 2014-05-13 DIAGNOSIS — Z794 Long term (current) use of insulin: Secondary | ICD-10-CM | POA: Insufficient documentation

## 2014-07-23 DIAGNOSIS — R079 Chest pain, unspecified: Secondary | ICD-10-CM | POA: Insufficient documentation

## 2014-07-23 DIAGNOSIS — G8929 Other chronic pain: Secondary | ICD-10-CM | POA: Insufficient documentation

## 2014-07-23 DIAGNOSIS — M545 Low back pain: Secondary | ICD-10-CM

## 2014-07-29 DIAGNOSIS — E1122 Type 2 diabetes mellitus with diabetic chronic kidney disease: Secondary | ICD-10-CM | POA: Insufficient documentation

## 2014-10-27 DIAGNOSIS — F329 Major depressive disorder, single episode, unspecified: Secondary | ICD-10-CM | POA: Insufficient documentation

## 2014-10-27 DIAGNOSIS — F32A Depression, unspecified: Secondary | ICD-10-CM | POA: Insufficient documentation

## 2015-01-14 NOTE — Consult Note (Signed)
PATIENT NAME:  Matthew Mayer, Matthew Mayer MR#:  [id number] DATE OF BIRTH:  09-23-1961  DATE OF CONSULTATION:  05/11/2013  REFERRING PHYSICIAN:  Erin FullingKurian Kasa, MD CONSULTING PHYSICIAN:  Kyung Ruddreighton C. Makita Blow, MD  REASON FOR CONSULTATION: Respiratory failure.   HISTORY OF PRESENT ILLNESS: The patient is a 54 year old male with history of COPD who underwent a cholecystectomy on 04/30/2013. Since that time he has been required to stay intubated and he has had failure to wean from vent. I was asked for evaluation given his respiratory failure and failure to wean from the ventilation. The patient's wife is in the room and reports that he has had a long-standing history of smoking and problems with COPD.  PAST MEDICAL HISTORY: COPD, hypertension, hyperlipidemia, diabetes, GERD, obesity, coronary artery disease, mild valvular heart disease.   PAST SURGICAL HISTORY: Cholecystectomy.   ALLERGIES: AUGMENTIN, BIAXIN, CODEINE, PENICILLIN.  SOCIAL HISTORY: The patient has history of smoking and occasional drinking.  FAMILY HISTORY: Positive for CVA, diabetes and coronary artery disease.   CURRENT MEDICATIONS:  Include Versed, Fentanyl, insulin drip, Protonix, Solu-Medrol, Lasix, Tylenol suppository, Colace, Senokot, albuterol, Ativan, Dilaudid, Zofran, heparin, fluticasone, chewable aspirin and vancomycin.   PHYSICAL EXAMINATION: VITAL SIGNS: Temperature 98.5, pulse 58, respirations 23, blood pressure 137/43, pulse ox 97% on PEEP of 10.  GENERAL: Reveals a well nourished male lying supine in bed, intubated and sedated.  HEENT: Ears: External ears are clear. Nose: Clear anteriorly. Oral cavity and oropharynx: There is an endotracheal tube in place. There is secure Velcro endotracheal tube holder. There is also an oral gastric tube in place and secure.  NECK: Supple with normal thyroid landmarks. No evidence of any high riding innominate artery.   IMPRESSION: History of chronic obstructive pulmonary disease with  respiratory failure following cholecystectomy with failure to wean from the vent.   PLAN: I discussed my findings with the patient's wife. I do feel that he is a candidate for tracheostomy tube. I discussed the risks and benefits of the tracheostomy tube including the risks of the tube being permanent, bleeding, infection, further respiratory failure and death given his already weakened state. She demonstrates understanding and agrees to proceed. This will be planned for Wednesday, 08/20, at 11:00 a.m. Please hold anticoagulation on the patient on 08/19 as well as please obtain consent from the patient's wife for tracheostomy tube. ____________________________ Kyung Ruddreighton C. Hatice Bubel, MD ccv:sb D: 05/11/2013 13:06:55 ET T: 05/11/2013 14:22:30 ET JOB#: [id number]  cc: Kyung Ruddreighton C. Lamyah Creed, MD, <Dictator> Kyung RuddREIGHTON C Sasan Wilkie MD ELECTRONICALLY SIGNED 05/13/2013 8:43

## 2015-01-14 NOTE — Consult Note (Signed)
PATIENT NAME:  Matthew Mayer, Matthew Mayer:  Dr. Michela PitcherEly CONSULTING Mayer:  Sherley Mckenney H. Allena KatzPatel, MD  PRIMARY CARE PROVIDER: Dr. Bayard MalesGauger.  REASON FOR CONSULTATION: Respiratory failure and management of his diabetes and coronary artery disease.   HISTORY OF PRESENT ILLNESS: The patient is a 54 year old white male, who underwent a robotic-assisted laparoscopic cholecystectomy for chronic cholecystitis and cholelithiasis. The patient was intubated for the procedure and postop the patient was having difficulty with being extubated. He was kept on the vent and his ABG was noted to have a pH of 7.05, pCO2 of 74 and pO2 of 68. Therefore, he will be admitted to the CCU and monitored on the ventilator. The patient, at this time, also has received some hydromorphone as well as Fentanyl as well as a dose of Lasix and Versed as needed since being in the PACU. The patient currently is drowsy and unable to give me any history. His chart was reviewed.   PAST MEDICAL HISTORY: Significant for hypertension, hyperlipidemia, diabetes, COPD, GERD, morbid obesity coronary artery disease, history of some mild valvular heart disease and also has a history of normal myocardial perfusion scan in the past.   ALLERGIES: Listed as AUGMENTIN, BIAXIN, CODEINE AND PENICILLIN.   CURRENT MEDICATIONS AT HOME: Aspirin 81 mg 1 tab p.o. daily, gemfibrozil 300 mg 1 tab p.o. b.i.d., Humalog sliding scale, Janumet 100/50 mg 1/2 tab p.o. b.i.d., Lantus 25 units at bedtime, Lovastatin 20 mg daily, naproxen 225 mg once a day, Nexium 40 mg once a day, vitamin 1 tab p.o. daily, Actos 45 mg 1/2 tab p.o. b.i.d., Pro-Air 2 puffs 2 times a day, quinapril 20 mg at bedtime, Spiriva 18 mcg daily and vitamin D3 1000 International Units daily.   SOCIAL HISTORY: History of smoking in the past, unclear if he continues to smoke. He also has a history of occasional  drinking.   FAMILY HISTORY: Father had a CVA and diabetes and died at 4566. Mother had coronary artery disease.   REVIEW OF SYSTEMS: Unobtainable due to the patient being currently intubated.   PHYSICAL EXAMINATION:  VITAL SIGNS: Temperature 98.6, pulse 80, respirations 18 and blood pressure 130/80.  GENERAL: The patient is a morbidly obese male, currently on the vent in critical condition.  HEENT: Head atraumatic, normocephalic. Pupils equally round, reactive to light and accommodation. There is no conjunctival pallor or scleral icterus. Nasal exam externally with no drainage or ulceration. Oropharynx has got the ET tube in place. External ear exam shows no erythema or drainage.  NECK: Supple without any JVD. No carotid bruits.  CARDIOVASCULAR: Regular rate and rhythm. Systolic murmur at the left sternal border. PMI is displaced.  LUNGS: Diminished breath sounds bilaterally without any rales, rhonchi, wheezing.  ABDOMEN: Morbidly obese. It is nondistended. No hepatosplenomegaly appreciated.  EXTREMITIES: No clubbing, cyanosis, or edema.  SKIN: No rash.  LYMPHATICS: No lymph nodes palpable.  VASCULAR: Good DP, PT pulses.  NEUROLOGICAL: The patient is currently intubated and has received sedation, so he is not responsive.  PSYCHIATRIC: The patient is not responding at this point due to receiving paralytic agents and sedation.   LABORATORY AND RADIOLOGICAL DATA: So far. his ABG: PH shows pH of 7.05, pCO2 of 74, pO2 of 68. His chest x-ray shows the left hemidiaphragm is obscured and there is partial obscuration of the right hemidiaphragm medially. The cardiac silhouette large. No infiltrate. Central pulmonary vascularity is engorged.  ASSESSMENT AND PLAN: The patient is a 54 year old morbid obese male who has undergone elective laparoscopic cholecystectomy, who is kept on the vent postop, unable to extubate the patient.  1.  Respiratory failure. Unable to wean off the vent postop. The patient's  chest x-ray without any significant abnormality. He has underlying chronic obstructive pulmonary disease. He may also have obstructive sleep apnea, hypoventilation syndrome. The patient will be kept in the CCU on the vent for the time being. We will start him on some Versed drip for sedation. We will also, in light of his chronic obstructive pulmonary disease, we will place him intravenous Solu-Medrol. I will check a BNP. We will start him on MDIs while on the vent as well as I have discussed the case with Dr. Dorris Fetch, who will assist with the vent management. 2.  Diabetes. We will place him on sliding scale for now. Hold p.o. meds. If his blood sugars continue to be elevated, he may need intravenous insulin protocol treatment.  3.  History of coronary artery disease. He is started on aspirin. We will check EKG and cardiac enzymes.   4.  Gastroesophageal reflux disease. He is already on intravenous Protonix, which we will continue.  5.  Hyperlipidemia. We will hold his oral regimen for the time being.  6.  Miscellaneous. Start him on Lovenox and  he is already on Protonix for gastrointestinal prophylaxis.   7. nicotine addciton- 4 min spent on smoking cessation, pt counceled regarding smoking,n nicotine patch offered  TIME SPENT: 50 minutes of critical care time spent on this consult.  ____________________________ Lacie Scotts. Allena Katz, MD shp:aw D: 04/30/2013 12:22:04 ET T: 04/30/2013 12:47:53 ET JOB#: [id number]  cc: Jaylean Buenaventura H. Allena Katz, MD, <Dictator> Charise Carwin MD ELECTRONICALLY SIGNED 05/04/2013 10:31 Charise Carwin MD ELECTRONICALLY SIGNED 05/04/2013 10:32

## 2015-01-14 NOTE — Op Note (Signed)
PATIENT NAME:  Matthew OharaERRELL, Khristian W MR#:  [id number] DATE OF BIRTH:  04/14/1961  DATE OF PROCEDURE:  05/13/2013  PREOPERATIVE DIAGNOSIS: Respiratory failure and chronic obstructive pulmonary disease.   POSTOPERATIVE DIAGNOSIS: Respiratory failure and chronic obstructive pulmonary disease.   PROCEDURE PERFORMED: Tracheostomy tube placement planned.  SURGEON: Bud Facereighton Taylen Osorto, M.D.   ANESTHESIA: General endotracheal anesthesia.   ESTIMATED BLOOD LOSS: Less than 10 mL.   IV FLUIDS: Please see anesthesia record.   COMPLICATIONS: None.   DRAINS/STENT PLACEMENTS: Size 8 cuffed Shiley tracheostomy tube.   SPECIMENS: None.   INDICATIONS FOR PROCEDURE: The patient is a 54 year old gentleman with history of severe COPD with respiratory failure requiring prolonged intubation with failure to wean from vent.   OPERATIVE FINDINGS: Size 8 cuffed Shiley tracheostomy tube successfully placed between tracheal rings 2 and 3.   DESCRIPTION OF PROCEDURE: The patient was identified in the unit. The consent was reviewed, which was signed by the patient's wife. The patient was taken to the operating room and placed in the supine position. General endotracheal anesthesia was continued. A shoulder roll was placed. The patient was prepped and draped in a sterile fashion after injection of 9 mL 1% lidocaine 1:100,000 epinephrine in the patient's anterior neck approximately 2 fingerbreadths above the sternal notch. At this time, a horizontal skin incision was made using 15 blade scalpel. Dissection was carried down through subcutaneous tissues down to the level of strap muscles using Bovie electrocautery. Strap muscles were divided along the median raphe down to the anterior thyroid isthmus. At this time, a Kelly clamp was used to dissect between the trachea and the thyroid isthmus. This was clamped on either side and then divided with harmonic scalpel. The anterior tracheal wall was encountered. This was cleaned using  Bovie electrocautery and bipolar electrocautery. At this time, the endotracheal tube cuff was decreased and a cricoid hook was placed in the patient's the cricoid cartilage and retracted superiorly. At this time, an 11 blade scalpel was used to make a horizontal incision between tracheal rings 2 and 3. The endotracheal tube was slowly withdrawn until the distal tip was just above the incision site and a size 8 cuffed Shiley tracheostomy tube was placed through the tracheostomy site. This is held in place with a 4-point fashion using 2-0 silk sutures and Surgicel was placed within the wound bed on either side of the tracheostomy tube. Single Vicryl was placed on the right side for reapproximation of the patient's skin.  At this time, a Betadine soaked gauze was placed underneath the tracheostomy tube, and then a Dale collar was used to secure tracheostomy tube in place. Care of the patient at this time was transferred to anesthesia where the patient was in guarded condition.      ____________________________ Kyung Ruddreighton C. Tameyah Koch, MD ccv:dp D: 05/13/2013 12:13:00 ET T: 05/13/2013 13:48:54 ET JOB#: [id number]  cc: Kyung Ruddreighton C. Brandee Markin, MD, <Dictator> Kyung RuddREIGHTON C Janaya Broy MD ELECTRONICALLY SIGNED 05/18/2013 15:42

## 2015-01-14 NOTE — Consult Note (Signed)
Brief Consult Note: Diagnosis: consider PEG.   Patient was seen by consultant.   Consult note dictated.   Comments: Mr. Matthew Mayer is a 54 y/o caucasian male on ventilator with respiratory failure s/p lap cholecystectomy 04/30/13.  We were consulted to discuss PEG tube placement for nutrition.  This afternoon there are no family members available to discuss.  I will attempt to contact them to discuss risks/benefits/alternatives.  Thanks for Consult.  Please see fell dictated note.  #[id number].  Electronic Signatures: Joselyn ArrowJones, Gotham Raden L (NP)  (Signed [phone]-Aug-14 23:25)  Authored: Brief Consult Note   Last Updated: 18-Aug-14 23:25 by Joselyn ArrowJones, Alysha Doolan L (NP)

## 2015-01-14 NOTE — Consult Note (Signed)
Chief Complaint:  Subjective/Chief Complaint Pt nonverbal.  Wife at bedside.  Mutiple questions answered regarding PEG placement.   VITAL SIGNS/ANCILLARY NOTES: **Vital Signs.:   19-Aug-14 08:00  Vital Signs Type Routine  Temperature Temperature (F) 99.8  Celsius 37.6  Temperature Source oral  Pulse Pulse 76  Respirations Respirations 24  Systolic BP Systolic BP 970  Diastolic BP (mmHg) Diastolic BP (mmHg) 40  Mean BP 60  Pulse Ox % Pulse Ox % 97  Pulse Ox Activity Level  At rest  Oxygen Delivery Ventilator Assisted  Pulse Ox Heart Rate 76   Brief Assessment:  GEN critically ill appearing, sedated on vent, nonverbal.  Wife at bedside.   Cardiac Regular   Respiratory rhonchi  on vent   Gastrointestinal details normal Soft  Bowel sounds normal  mildly distended   EXTR 1+LEE bilat   Additional Physical Exam Skin:pale, warm, dry   Lab Results:  Lab:  19-Aug-14 10:45   pH (ABG) 7.38  PCO2 42  PO2  74  FiO2 80  Base Excess -0.4  HCO3 24.8  O2 Saturation 92.0  O2 Device 840  Specimen Site (ABG) RT RADIAL  Specimen Type (ABG) ARTERIAL  Patient Temp (ABG) 37.0  Mode ASSIST CONTROL  Vt 600  PEEP 10.0  Mechanical Rate 24 (Result(s) reported on 12 May 2013 at 11:04AM.)  Routine Chem:  19-Aug-14 04:26   Creatinine (comp) 1.14  eGFR (African American) >60  eGFR (Non-African American) >60 (eGFR values <47m/min/1.73 m2 may be an indication of chronic kidney disease (CKD). Calculated eGFR is useful in patients with stable renal function. The eGFR calculation will not be reliable in acutely ill patients when serum creatinine is changing rapidly. It is not useful in  patients on dialysis. The eGFR calculation may not be applicable to patients at the low and high extremes of body sizes, pregnant women, and vegetarians.)  Potassium, Serum 4.0 (Result(s) reported on 12 May 2013 at 04:47AM.)  Phosphorus, Serum 3.2 (Result(s) reported on 12 May 2013 at 04:49AM.)   Magnesium, Serum  1.7 (1.8-2.4 THERAPEUTIC RANGE: 4-7 mg/dL TOXIC: > 10 mg/dL  -----------------------)  Calcium (Total), Serum 8.5 (Result(s) reported on 12 May 2013 at 04:47AM.)   Assessment/Plan:  Assessment/Plan:  Assessment For PEG placement: Wife agrees w/ plans.  Discussed risks/benefits/alternatives.  She understands this does not change underlying disease not decrease his risk for aspiration.  We will attempt to coordinate trach & PEG placement tomorrow.   Plan 1) NG tube feedings will need to be DC'd 2) Heparin will need to be held 3) EG w/PEG placement at bedside today   Electronic Signatures: JAndria Meuse(NP)  (Signed 19-Aug-14 12:05)  Authored: Chief Complaint, VITAL SIGNS/ANCILLARY NOTES, Brief Assessment, Lab Results, Assessment/Plan   Last Updated: 19-Aug-14 12:05 by JAndria Meuse(NP)

## 2015-01-14 NOTE — Consult Note (Signed)
PATIENT NAME:  Matthew Mayer, Matthew Mayer MR#:  [id number] DATE OF BIRTH:  [birth year]  DATE OF CONSULTATION:  05/19/2013  REFERRING PHYSICIAN:  Theodoro Grist, MD CONSULTING PHYSICIAN:  Cheral Marker. Ola Spurr, MD  REASON FOR CONSULTATION:  Fever and MRSA in a CCU patient.   HISTORY OF PRESENT ILLNESS: A 54 year old gentleman who was admitted on August 7, after he had a laparoscopic cholecystectomy for chronic cholecystitis and cholelithiasis. He ended up developing acute respiratory failure and was intubated. Since that time, he has been in the unit and has had difficulty weaning. He has a baseline history of COPD which is likely related to why he has had such a prolonged vent course. He also has a history of diabetes, coronary artery disease and GERD. Initially, the patient was relatively stable except for the vent status, however, he started to develop spiking fevers on August 20. These have persisted and are up to 102.5 currently. Of note, he was diagnosed with a MRSA pneumonia on cultures done on the 15th and has been on vancomycin from the 17th onward. Currently, he does have a trach and PEG in place. There is some drainage around the trach site. He is making some sputum from the trach. He has a PEG tube as well which is intact. He has had some loose bowel movements last night but prior to that was not having any. He has some skin tears on his sacral area but no active decubitus ulcers.  PAST MEDICAL HISTORY 1.  COPD.   2.  Diabetes.  3.  CAD.  4.  Hypertension.  5.  Hyperlipidemia.  6.  Morbid obesity.  7.  Valvular heart disease.  8.  Hyperlipidemia.   PAST SURGICAL HISTORY: Laparoscopic cholecystectomy as above.   SOCIAL HISTORY: The patient was a smoker. Occasionally drinks alcohol.   FAMILY HISTORY: Positive for CVA in his father and diabetes. His mother had coronary artery disease.   REVIEW OF SYSTEMS: Unable to be obtained.   ALLERGIES: THE PATIENT IS LISTED AS BEING ALLERGIC TO AUGMENTIN,  BIAXIN, CODEINE AND PENICILLIN, ALTHOUGH THESE ALLERGIES ARE NOT KNOWN THE REACTION TO THEM.   ANTIBIOTICS SINCE ADMISSION: The patient was on ciprofloxacin from August 7 to August 10. He has been on vancomycin since August 17 and is due to get his last dose tomorrow.   OTHER MEDICATIONS: Currently include: Aspirin, Flovent, subcu heparin, Dilaudid p.r.n., Zofran p.r.n., Combivent neb, pantoprazole, vitamin D3, gemfibrozil, lovastatin, metformin, multivitamin, pioglitazone, quinapril, lorazepam p.r.n., Tylenol, ibuprofen, scopolamine patch, insulin, Colace.   PHYSICAL EXAMINATION VITAL SIGNS: T-max in the last 24 hours 101.5. I traced out his prior fevers and on the 25th his T-max was 102.5, the 24th 103, the 23rd 101.5, the 22nd 101.1, the 21st 102. On the 20th, it was 100.9, prior to that from the 19th back to the 13th he had no fevers. His pulse currently is 80, blood pressure 139/59, respirations 19, sat of 94% on the trach.  GENERAL: He is lying quietly in bed. He is obese.  HEENT: Pupils equal, round and reactive to light and accommodation. Extraocular movements are intact. Sclerae anicteric. Oropharynx is clear although his mucous membranes are dry.  NECK: His has a trach in place. His trach site is somewhat macerated and has some yellowish-greenish drainage. There are some tan-colored secretions in trach suction. His neck is supple.  There is no anterior cervical, posterior cervical or supraclavicular lymphadenopathy.  HEART: Regular.  LUNGS: Coarse breath sounds bilaterally.  ABDOMEN: Soft, obese, nontender. He has  laparoscopy incision sites which are without any drainage or tenderness. He does still have a JP drain in place with serosanguineous fluid draining from the right upper quadrant. He has a PEG tube which is intact.  EXTREMITIES: No clubbing, cyanosis or edema. Joints, there is no obvious effusion or swelling.  SKIN: There is no rash or peripheral stigmata of endocarditis.    LABORATORY, DIAGNOSTIC AND RADIOLOGICAL DATA: Blood cultures done August 24 negative to date. August 15 sputum cultures grew MRSA sensitive to clindamycin, gentamicin, erythromycin, vancomycin, linezolid, tigecycline and Bactrim; it was resistant to levofloxacin, oxacillin and ciprofloxacin. No other cultures are pending. Urinalysis was just sent. White blood count currently is 14.2, hemoglobin 10.6, platelets 238. ESR is 106. White blood count has been 14 on the 24th, 16 on the 20th. Renal function was normal with a creatinine of 1.19.  There have been no LFTs checked in 1 week.   IMAGING: Chest x-ray done on August 25th showed tracheostomy tube, there is mild bilateral interstitial thickening, no parenchymal focal opacities, pleural effusion or pneumothorax. No other imaging besides x-rays throughout his hospital course.   IMPRESSION: A 54 year old male who has had a complicated respiratory course postoperative for a laparoscopic cholecystectomy. He apparently had been afebrile until around the 19th or 20th, when he started to develop fevers that have been persistent since then. He has been on vancomycin for a methicillin-resistant Staphylococcus aureus pneumonia or bronchitis since the 17th. He has a mildly elevated white count.   There are multiple sources for these recurrent fevers. He does not have a central line and no evidence of bacteremia with his blood cultures negative. That is reassuring, however, he has a trach site with some liquid drainage around it as well as some sputum from the trach site. He also has a JP drain in place which I am not sure is draining anything, but there could be an intra-abdominal process at this point. His incisions do look intact otherwise. He has no evidence of drug reaction, gout, sinusitis, Clostridium difficile or other etiologies of his persistent fever.   RECOMMENDATIONS 1.  I would recommend sending sputum trach aspirate for culture. It has been over 2  weeks since that was done.  2.  If he has any diarrhea will check C. difficile.  3.  Check liver panel to make sure there is no other etiology.  4. I would ask surgery to readdress the need for the JP drain. Would also consider CT of his abdomen to evaluate for any loculated abscesses postoperatively.  5.  I would recommend continuing the vancomycin for now. If he becomes hemodynamically stable, I would add broader gram-negative coverage but, at this point, we can await cultures.   Thank you for the consult. I will be glad to follow with you.  ____________________________ Cheral Marker. Ola Spurr, MD dpf:cs D: 05/19/2013 18:50:00 ET T: 05/19/2013 19:15:21 ET JOB#: [id number] DAVID FITZGERALD MD ELECTRONICALLY SIGNED 05/25/2013 11:58

## 2015-01-14 NOTE — Consult Note (Signed)
PATIENT NAME:  Matthew Mayer, Matthew Mayer MR#:  [id number] DATE OF BIRTH:  1960-11-23  DATE OF CONSULTATION:    REFERRING PHYSICIAN:  Dr. Bridgett Larsson.  CONSULTING PHYSICIAN:  Andria Meuse, NP  GASTROENTEROLOGIST: Dr. Allen Norris.   SURGEON: Dr. Pat Patrick.   PRIMARY CARE PHYSICIAN: Dr. Dayton Martes.   REASON FOR CONSULTATION: PEG consideration.   HISTORY OF PRESENT ILLNESS: Matthew Mayer is a critically ill, 54 year old, Caucasian male who underwent robotic-assisted laparoscopic cholecystectomy for chronic cholecystitis and cholelithiasis on 04/30/2013. He was intubated for the procedure and was unable to be weaned off ventilator due to COPD, CHF, atelectasis and sleep apnea. He also has a history of diabetes mellitus and coronary artery disease. Plans are being made for tracheostomy, and we were consulted for feeding tube. Unfortunately, at this time there are no family members available to discuss. He has been receiving NG tube feeds and has done well with those.   PAST MEDICAL AND SURGICAL HISTORY: Hypertension, hyperlipidemia, diabetes mellitus, COPD, CHF, coronary artery disease, GERD, morbid obesity, valvular heart disease, laparoscopic cholecystectomy August 7th by Dr. Pat Patrick.   MEDICATIONS PRIOR TO ADMISSION: Aspirin 81 mg daily, gemfibrozil 600 mg 1/2 tablet b.i.d., Humalog sliding scale insulin t.i.d. Janumet 1000/50 m, Lantus insulin 25 units subcutaneously at bedtime, lovastatin 20 mg daily, naproxen sodium 220 mg 5 tablets daily, Nexium 40 mg daily, Men's One-A-Day daily, pioglitazone 45 mg 1/2 tablet b.i.d., ProAir 2 puffs b.i.d. p.r.n., quinapril 20 mg at bedtime, Spiriva 18 mcg daily, vitamin D3 1000 international units b.i.d.   ALLERGIES: AUGMENTIN ES (rash), BIAXIN (rash), CODEINE (rash) and PENICILLIN (anaphylaxis).   FAMILY HISTORY: Unable to obtain.   SOCIAL HISTORY: Review of medical chart shows he has a history of tobacco abuse, occasional alcohol.   REVIEW OF SYSTEMS: Unable to obtain due to the patient  being on the ventilator.   PHYSICAL EXAMINATION:  VITAL SIGNS: Temp 98.5, pulse 56, respirations 23, blood pressure 148/57, O2 sat 98% on vent.  HEENT: Sclerae clear, nonicteric. Conjunctivae pale. Oropharynx dry. He has ET tube intact. NG tube intact.  NECK: Supple without any mass or thyromegaly.  CHEST: Heart regular rate and rhythm with normal S1, S2, without murmurs, clicks, rubs or gallops.  LUNGS: Clear to auscultation bilaterally.  ABDOMEN: Protuberant. He has a dressing in the right upper quadrant. He has sutures at the umbilicus. The sites are clear. He has positive bowel sounds x4. No bruits auscultated. Abdomen is soft, moderately distended without palpable mass or hepatosplenomegaly. No rebound, tenderness or guarding.  EXTREMITIES: Without cyanosis or clubbing. He does have 1+ pitting edema bilaterally.  SKIN: Pale, warm and dry.  NEUROLOGIC: The patient is currently nonresponsive. He is sedated.  PSYCHIATRIC: He is nonresponsive and sedated.   LABORATORY STUDIES: Glucose 229, BUN 44, calcium 8.3. Otherwise normal met-7. Triglycerides 511. Magnesium 1.8. Phosphorus 3.2. White blood cell count 17.1, hemoglobin 11, hematocrit 33, platelets 258. CT chest with contrast shows dense bilateral pulmonary infiltrates versus atelectasis. No PE. Good tube positioning.   IMPRESSION: Matthew Mayer is a 54 year old Caucasian male on ventilator with respiratory failure status post laparoscopic cholecystectomy on 04/30/2013. We were consulted to discuss percutaneous endoscopic gastrostomy tube placement for nutrition. Today, this afternoon, there were no family members available to discuss proceeding with this. I will attempt to contact them tomorrow to discuss risks, benefits and alternatives. He is critically ill. Plans are being made for tracheostomy. He has tolerated NG tube feeds well.   We would like to thank you for allowing [place]  to participate in the care of Matthew Mayer.     ____________________________ Andria Meuse, NP klj:gb D: 05/11/2013 23:25:00 ET T: 05/11/2013 23:39:14 ET JOB#: [id number]  cc: Dr. Earline Mayotte, NP, <Dictator>  Andria Meuse FNP ELECTRONICALLY SIGNED 06/08/2013 13:22

## 2015-01-14 NOTE — Consult Note (Signed)
Chief Complaint:  Subjective/Chief Complaint Patient developed resp. distress last p.m. and trach unable to be replaced and ETT placed emergently.  ENT notified at 8:38 that trach had fallen out and patient was not intubated with ETT.   VITAL SIGNS/ANCILLARY NOTES: **Vital Signs.:   28-Aug-14 10:00  Vital Signs Type Routine  Temperature Temperature (F) 99.3  Celsius 37.3  Temperature Source axillary  Pulse Pulse 92  Respirations Respirations 24  Systolic BP Systolic BP 156  Diastolic BP (mmHg) Diastolic BP (mmHg) 43  Mean BP 80  Pulse Ox % Pulse Ox % 99  Pulse Ox Activity Level  At rest  Oxygen Delivery Ventilator Assisted  Pulse Ox Heart Rate 92   Brief Assessment:  GEN well developed, well nourished, no acute distress, ETT in place and secure in mouth   Respiratory normal resp effort   EXTR trach site revealed open wound with ETT visible through site.   Additional Physical Exam Procedure:  Trach placement  Patient was appropriately positioned and Dr. Pernell DupreAdams with Anesthesiology gradually withdrew ETT until tip of tube was visualized superior to the trach site.  Size 6 cuffed shiley placed into trach site over catheter without complications.  Bilateral lung breath sounds with good CO2 return.   Assessment/Plan:  Assessment/Plan:  Assessment s/p trach with trach plug and replacement of trach now with Size 6 shiley cuffed.   Plan Continue routine trach care.  Please reconsult if any issues arise.   Electronic Signatures: Shaul Trautman, Rayfield Citizenreighton Charles (MD)  (Signed 28-Aug-14 13:06)  Authored: Chief Complaint, VITAL SIGNS/ANCILLARY NOTES, Brief Assessment, Assessment/Plan   Last Updated: 28-Aug-14 13:06 by Flossie DibbleVaught, Bensen Chadderdon Charles (MD)

## 2015-01-14 NOTE — Op Note (Signed)
PATIENT NAME:  Matthew OharaERRELL, Cardale W MR#:  [id number] DATE OF BIRTH:  October 19, 1960  DATE OF PROCEDURE:  05/03/2013  PREOPERATIVE DIAGNOSIS: Respiratory failure, lack of intravenous access.   POSTOPERATIVE DIAGNOSIS: Respiratory failure, lack of intravenous access.   OPERATION: Insertion of central venous catheter.   SURGEON: Quentin Orealph L. Ely, III, MD   OPERATIVE PROCEDURE: With the patient in the supine position after the induction of appropriate IV sedation, the patient's right neck was prepped with ChloraPrep and draped with sterile towels. The area was then interrogated with the ultrasound, and a compressible patent right internal jugular vein was identified. Local anesthetic was instilled in the neck, and the jugular vein was cannulated with a single pass using ultrasound guidance. A wire passed into the great vessel system under direct vision. The skin was nicked and the needle exchanged for a dilator. The dilator was then exchanged for a triple lumen catheter. The area was cleaned, dressed sterilely and appropriately flushed. The patient then underwent chest x-ray for follow-up.     ____________________________ Carmie Endalph L. Ely III, MD rle:cb D: 05/03/2013 10:03:24 ET T: 05/03/2013 20:59:15 ET JOB#: [id number]  cc: Quentin Orealph L. Ely III, MD, <Dictator> Quentin OreALPH L ELY MD ELECTRONICALLY SIGNED 05/11/2013 22:34

## 2015-01-14 NOTE — Discharge Summary (Signed)
PATIENT NAME:  Matthew Mayer, Matthew Mayer MR#:  [id number] DATE OF BIRTH:  Sep 01, 1961  DATE OF ADMISSION:  04/30/2013 DATE OF DISCHARGE:  05/26/2013   ATTENDING PHYSICIAN: Cristal Deer A. Orrie Schubert, MD  DISCHARGE DIAGNOSES:  1. Symptomatic cholelithiasis status post robotic cholecystectomy.  2. Postoperative respiratory failure, requiring tracheostomy.  3. Inability to tolerate p.o., status post percutaneous endoscopic gastrostomy tube placement.  4. Pneumonia.  5. Urinary tract infection.  6. History of hypertension.  7. Hyperlipidemia.  8. Diabetes.  9. Chronic obstructive pulmonary disease.  10. Gastroesophageal reflux disease. 11. Morbid obesity.  12. Coronary artery disease.  13. Valvular heart disease.   ALLERGIES: AUGMENTIN, BIAXIN, CODEINE, PENICILLIN.   DISCHARGE MEDICATIONS: As follows:  1. Quinapril 20 mg via G-tube p.o. daily.  2. Heparin 5000 units subcutaneous q.8 hours.  3. Sliding scale insulin.  4. Acetylcysteine 3 mL b.i.d.  5. Zofran 4 mg p.o. q.4 hours p.r.n. nausea.  6. Gemfibrozil 300 mg b.i.d. before meals.  7. Lovastatin 20 mg p.o. daily.  8. Aspirin 81 mg p.o. daily.  9. Chlorhexidine 5 mL orally every 12 hours.  10. Ativan 2 mg IV q.1 hour p.r.n. agitation.  11. DuoNeb inhaled p.r.n.  12. Meropenem 2 grams IV q.8 hours for 10 more days.  13. Glycopyrrolate 0.4 injectable q.4 hours p.r.n. secretions. 14. Propofol IV for sedation.  15. Vancomycin 1.25 IV q.24 hours.  16. Senna 10 mL b.i.d.  17. Colace 10 mL b.i.d.  18. Protonix 40 mg p.o. daily.  19. Budesonide 0.5 mg/2 mL inhaled p.r.n.  20. Multivitamin 5 mL p.o. daily.  21. Cholecalciferol 1000 units daily.  22. Osmolite 1.2 calories 45 mL q.1 hour continuous.   INDICATION FOR ADMISSION: Matthew Mayer is a pleasant 54 year old male with a history of gallstones and persistent pain and biliary disease. He thus was brought to the operating room suite for cholecystectomy.   PROCEDURES PERFORMED WHILE IN THE  HOSPITAL:  1. Robotic-assisted cholecystectomy on 04/30/2013.  2. Tracheostomy on 05/13/2013.  3. Percutaneous endoscopic gastrostomy tube on 05/12/2013.   HOSPITAL COURSE: Matthew Mayer underwent robotic-assisted cholecystectomy on 04/30/2013. Postoperatively, he was unable to be weaned off the ventilator. He later, when it was found that he was unable to be weaned, had a tracheostomy placed as well as percutaneous endoscopic gastrostomy tube placed. He was thus weaned off the vent slowly throughout hospital course and progressed to goal tube feeds. On August 27th, he had what sounds like a tracheostomy mucous plug and had to be intubated for respiratory arrest. Tracheostomy was pulled at that time and later replaced and upsize on August 28th. He had another milder episode, and at that time, was begun on treatment for pneumonia as well as a urinary tract infection throughout hospital course. He continued to tolerance tube feeds, and ventilator was weaned, and on September 2nd, was deemed to be suitable to go to [facility] for ventilator wean rehabilitation and long-term nutrition. Of note, he appeared to have loosening of his tracheostomy. ENT was called out to evaluate tracheostomy, and this will be addressed before his discharge.   DETAILS OF DISCHARGE: As follows: Matthew Mayer is to follow up in our office in approximately 1 week, or longer if he is doing fine, but is to undergo long-term ventilator weaning. They are instructed to call if there are any issues during rehabilitation.   ____________________________ Matthew Raider Kaio Kuhlman, MD cal:OSi D: 05/26/2013 11:25:09 ET T: 05/26/2013 11:55:37 ET JOB#: [id number]  cc: Cristal Deer A. Zyair Rhein, MD, <Dictator> Jeray Shugart A Teng Decou  MD ELECTRONICALLY SIGNED 05/29/2013 8:13

## 2015-01-14 NOTE — Consult Note (Signed)
Referring Physician:  Trudie Reed   Primary Care Physician:  [facility], [address] [place], [place], [place] [address], [phone]  Reason for Consult: Admit Date: 04-May-2013  Chief Complaint: altered mental status  Reason for Consult: altered mental status   History of Present Illness: History of Present Illness:   54 yo RHD M presents severe infection and COPD.  He has had a prolonged stay in the ICU with shock to include cardiovascular support with steroids and pressors.  Pt has been off sedating medications for the last 7 days but still has not been waking up so neurology was call.  Apparently pt tracks the nurses with his eyes.  ROS:  Review of Systems   unobtainable secondary to mental status  Past Medical/Surgical Hx:  Multi-drug Resistant Organism (MDRO): Positive culture for MRSA.  Past Medical/ Surgical Hx:  Past Medical History Significant for hypertension, hyperlipidemia, diabetes, COPD, GERD, morbid obesity coronary artery disease, history of some mild valvular heart disease and also has a history of normal myocardial perfusion scan in the past.   Home Medications: Medication Instructions Last Modified Date/Time  Spiriva 18 mcg inhalation capsule 1 each inhaled once a day 07-Aug-14 07:31  ProAir HFA CFC free 90 mcg/inh inhalation aerosol 2 puff(s) inhaled 2 times a day, As Needed - for Shortness of Breath 07-Aug-14 07:31  Lantus 100 units/mL subcutaneous solution 25 unit(s) subcutaneous once a day (at bedtime) 07-Aug-14 07:31  HumaLOG 100 units/mL subcutaneous solution sliding scale based on blood sugar unit(s) subcutaneous 3 times a day. BG <120 no insulin BG 220-200 10 units BG 200-300 15 units BG >300 25 units 07-Aug-14 07:31  naproxen sodium sodium 220 mg oral tablet 5 tab(s) orally once a day 07-Aug-14 07:31  NexIUM 40 mg oral delayed release capsule 1 cap(s) orally once a day 07-Aug-14 07:31  gemfibrozil 600 mg oral tablet 0.5  tab(s) orally 2 times a day 07-Aug-14 07:31  Aspir 81 81 mg oral tablet 1 tab(s) orally once a day (at bedtime) 07-Aug-14 07:31  quinapril 20 mg oral tablet 1 tab(s) orally once a day (at bedtime) 07-Aug-14 07:31  Vitamin D3 1000 intl units oral capsule 1 cap(s) orally 2 times a day 07-Aug-14 07:31  lovastatin 20 mg oral tablet 1 tab(s) orally once a day 07-Aug-14 07:31  Janumet 1000 mg-50 mg oral tablet 0.5 tab(s) orally 2 times a day 07-Aug-14 07:31  pioglitazone 45 mg oral tablet 0.5 tab(s) orally 2 times a day 07-Aug-14 07:31  One-A-Day Men 50 Plus Multiple Vitamins with Minerals oral tablet 1 tab(s) orally once a day (at bedtime) 07-Aug-14 07:31   Allergies:  PCN: Other, Anaphylaxis  Biaxin: Rash  Augmentin ES-600: Rash  Codeine: Rash  Allergies:  Allergies see above   Social/Family History: Lives With: spouse  Living Arrangements: house  Social History: no tob, no EtOH, no illicits  Family History: n/c   Vital Signs: **Vital Signs.:   25-Aug-14 13:00  Vital Signs Type Routine  Temperature Temperature (F) 100.5  Celsius 38  Temperature Source oral  Pulse Pulse 80  Pulse source if not from Vital Sign Device per cardiac monitor  Respirations Respirations 16  Systolic BP Systolic BP 469  Diastolic BP (mmHg) Diastolic BP (mmHg) 52  Mean BP 81  Pulse Ox % Pulse Ox % 97  Oxygen Delivery Ventilator Assisted  Pulse Ox Heart Rate 82   Physical Exam: General: NAD, overweight, resting comfortable  HEENT: normocephalic, sclera nonicteric, oropharynx clear  Neck: supple, no JVD, no bruits  Chest: coarse BS, good movement via trach, mild wheezing  Cardiac: RRR, no murmurs, 2+ pulses  Extremities: mild edema, no movement   Neurologic Exam: Mental Status: alert but can not speak via trach, opens eyes and tracks, follows commands with eyes and tries to smile  Cranial Nerves: pupils 94m B and reactive, EOMI, good corneals, good cough  Motor Exam: flaccid tone, 0/5, no atrophy,  no fasciculations  Deep Tendon Reflexes: 0/4 B, mute plantars  Sensory Exam: grimaces to pain in all 4 ext  Coordination: untestable   Lab Results: LabObservation:  10-Aug-14 13:58   OBSERVATION Reason for Test  Hepatic:  11-Aug-14 03:09   Bilirubin, Direct 0.1  13-Aug-14 03:48   Bilirubin, Total 0.5  Alkaline Phosphatase 79  SGPT (ALT) 75  SGOT (AST)  42  Total Protein, Serum 6.5  Albumin, Serum  2.2  Pathology:  07-Aug-14 12:00   Pathology Report ========== TEST NAME ==========  ========= RESULTS =========  = REFERENCE RANGE =  PATHOLOGY REPORT  Pathology Report .                               [   Final Report         ]                   Material submitted:         .Marland KitchenGALLBLADDER .                               [   Final Report         ]                   Pre-operative diagnosis:                                        . CHOLELITHIASIS, ROBOT ASSISTED LAP CHOLE .                               [   Final Report         ]                   ********************************************************************** Diagnosis: GALLBLADDER: - CHRONIC CHOLECYSTITIS AND REPORTED CHOLELITHIASIS BASED ON OPERATIVE FINDINGS, CALCULI ARE NOT SEEN GROSSLY OR MICROSCOPICALLY. - NEGATIVE FOR DYSPLASIA AND MALIGNANCY. . XDB/05/01/2013 ********************************************************************** .                               [   Final Report         ]                   Electronically signed:                                     . DLum Babe MD, Pathologist .                               [  Final Report         ]                   Gross description:                                         . Received in a formalin filled container labeled Maurine Simmering and gallbladder is a 7.0 x 3.0 x 1.5 cm gallbladder. The serosal surfaces are pink yellow to pink tan smooth. There is a 1.1 cm roughened edged transmural defect located in the gallbladder neck. Within the lumen is a  scantgreen tan viscous bile. No discrete calculi are grossly identified. The mucosa is biled stained and velvety. The wall thickness averages 0.1 to 0.2 cm. The cystic duct is grossly patent. No discrete pericystic duct lymph node candidate is identified. Representative sections are submitted in cassette 1. QAC/KCT .                               [   Final Report         ]                   Pathologist provided ICD-9: 574.10 .                               [   Final Report         ]         CPT                                                        . [id number]               [facility]            No: [id number]           [address] [place], [place] [address]           Lindon Romp, MD         [phone]                                         [id number]   Result(s) reported on 01 May 2013 at 12:21PM.  TDMs:  23-Aug-14 19:16   Vancomycin, Trough LAB 19 (Result(s) reported on 16 May 2013 at 07:43PM.)  LabUnknown:  15-Aug-14 10:30   Ind. Clindamycin Resistance NEGATIVE  Routine Micro:  14-Aug-14 15:30   Gram Stain 4 AND CLUSTERS, RARE GRAM POSITIVE RODS  15-Aug-14 10:30   Organism Name Tracy.AUREUS  Organism Quantity MODERATE GROWTH  Clindamycin Sensitivity S  Oxacillin Sensitivity R  Ciprofloxacin Sensitivity R  Gentamicin Sensitivity S  Erythromycin Sensitivity S  Vancomycin Sensitivity S  Linezolid Sensitivity S  Tigecycline Sensitivity S  Trimethoprim/Sulfamethoxazole Sensitivty S  Cefoxitin Scrn. POSITIVE  Lefofloxacin Sensitivity R  Organism 1 MODERATE GROWTH METHICILLIN RESISTANT STAPH.AUREUS  Gram Stain 1 EXCELLENT SPECIMEN-90-100% WBC  Gram Stain 2 MANY WHITE BLOOD CELLS  Gram  Stain 3 FEW GRAM POSITIVE COCCI IN PAIRS IN CLUSTERS  24-Aug-14 17:51   Micro Text Report BLOOD CULTURE   COMMENT                   NO GROWTH IN 8-12 HOURS   ANTIBIOTIC                       Culture Comment NO GROWTH IN  8-12 HOURS  Result(s) reported on 18 May 2013 at 01:01AM.  Routine Chem:  07-Aug-14 13:30   B-Type Natriuretic Peptide [facility]) 50 (Result(s) reported on 30 Apr 2013 at 02:44PM.)  18-Aug-14 03:06   Triglycerides, Serum  511 (Result(s) reported on 11 May 2013 at 03:24AM.)  Result Comment LABS - This specimen was collected through an   - indwelling catheter or arterial line.  - A minimum of 12ms of blood was wasted prior    - to collecting the sample.  Interpret  - results with caution.  Result(s) reported on 11 May 2013 at 03:49AM.  23-Aug-14 05:33   Glucose, Serum  248  BUN  38  Chloride, Serum  110  CO2, Serum 27  Anion Gap 7  Osmolality (calc) 304  25-Aug-14 02:19   Creatinine (comp) 1.25  eGFR (African American) >60  eGFR (Non-African American) >60 (eGFR values <610mmin/1.73 m2 may be an indication of chronic kidney disease (CKD). Calculated eGFR is useful in patients with stable renal function. The eGFR calculation will not be reliable in acutely ill patients when serum creatinine is changing rapidly. It is not useful in  patients on dialysis. The eGFR calculation may not be applicable to patients at the low and high extremes of body sizes, pregnant women, and vegetarians.)  Sodium, Serum 145 (Result(s) reported on 18 May 2013 at 03:27AM.)  Potassium, Serum 3.5 (Result(s) reported on 18 May 2013 at 03:27AM.)  Magnesium, Serum 2.0 (1.8-2.4 THERAPEUTIC RANGE: 4-7 mg/dL TOXIC: > 10 mg/dL  -----------------------)  Phosphorus, Serum 3.5 (Result(s) reported on 18 May 2013 at 03:29AM.)  Calcium (Total), Serum 8.8 (Result(s) reported on 18 May 2013 at 03:27AM.)  Cardiac:  08-Aug-14 04:02   CK, Total 38 (Result(s) reported on 01 May 2013 at 04:35AM.)  CPK-MB, Serum  < 0.5 (Result(s) reported on 01 May 2013 at 04:35AM.)  Troponin I < 0.02 (0.00-0.05 0.05 ng/mL or less: NEGATIVE  Repeat testing in 3-6 hrs  if clinically indicated. >0.05 ng/mL: POTENTIAL  MYOCARDIAL INJURY.  Repeat  testing in 3-6 hrs if  clinically indicated. NOTE: An increase or decrease  of 30% or more on serial  testing suggests a  clinically important change)  Routine Hem:  24-Aug-14 05:22   WBC (CBC)  14.1  RBC (CBC)  3.79  Hemoglobin (CBC)  10.2  Hematocrit (CBC)  31.3  Platelet Count (CBC) 286  MCV 83  MCH 26.8  MCHC 32.5  RDW  15.1  Neutrophil % 75.5  Lymphocyte % 13.9  Monocyte % 8.3  Eosinophil % 1.4  Basophil % 0.9  Neutrophil #  10.7  Lymphocyte # 2.0  Monocyte #  1.2  Eosinophil # 0.2  Basophil # 0.1 (Result(s) reported on 17 May 2013 at 05:43AM.)   Radiology Results: CT:    24-Aug-14 14:25, CT Head Without Contrast  CT Head Without Contrast   REASON FOR EXAM:    patient not following commands  COMMENTS:       PROCEDURE: CT  - CT HEAD WITHOUT CONTRAST  - May 17 2013  2:25PM     RESULT: Axial noncontrast CT scanning was performed through the brain   with reconstructions at 5 mm intervals and slice thicknesses .    The ventricles are normal in size and position. There is no intracranial   hemorrhage nor intracranial mass effect. There is no evidence of an   evolving ischemic infarction. The cerebellum and brainstem are normal in   density.    At bone window settings no acute calvarial abnormality is demonstrated.   The paranasal sinuses exhibit no air fluid levels.  IMPRESSION:   1. There is no evidence of an acute ischemic or hemorrhagic infarction.  2. There is no intracranial masseffect nor hydrocephalus.  3. The paranasal sinuses exhibit no evidence of acute inflammation.     Dictation Site: 5        Verified By: DAVID A. Martinique, M.D., MD   Radiology Impression: Radiology Impression: CT of head personally reviewed by me and normal   Impression/Recommendations: Recommendations:   reviewed prior notes reviewed by me d/w Dr. 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 EEG tomorrow check CPK, myoglobin, B12/folate, ESR hold sedation will follow  Electronic Signatures: Jamison Neighbor (MD)  (Signed 25-Aug-14 13:53)  Authored: REFERRING PHYSICIAN, Primary Care Physician, Consult, History of Present Illness, Review of Systems, PAST MEDICAL/SURGICAL HISTORY, HOME MEDICATIONS, ALLERGIES, Social/Family History, NURSING VITAL SIGNS, Physical Exam-, LAB RESULTS, RADIOLOGY RESULTS, Recommendations   Last Updated: 25-Aug-14 13:53 by Jamison Neighbor (MD)

## 2015-01-14 NOTE — Consult Note (Signed)
Details:   - Patient again with positional desaturations with tracheostomy tube last p.m.  Called to evaluate patient.  Procedure:  Placement of XLT Proximal extension 7 cuffed trach tube.  Bougie catheter placed within shiley trach and shiley trach removed.  Moderate amount of thickened mucus on end of tracheostomy tube.  Janina Mayorach was not fully within the patient's trachea with balloon outside within trach wound.  Size 7 proximal extension trach placed over bougie and easily placed within patient's tracheostomy site.  Good ventilation with no signficiant leak.  Trach secured with tie.  Impression:  Respiratory failure with need for extended length trach due to patient's body habitus/neck circumference  Plan:  Continue routine trach care.  Hopefully extended length with make much less positional.   Electronic Signatures: Dalissa Lovin, Rayfield Citizenreighton Charles (MD)  (Signed 29-Aug-14 08:30)  Authored: Details   Last Updated: 29-Aug-14 08:30 by Flossie DibbleVaught, Geraldean Walen Charles (MD)

## 2015-01-14 NOTE — Consult Note (Signed)
Chief Complaint:  Subjective/Chief Complaint Pt nonverbal.   VITAL SIGNS/ANCILLARY NOTES: **Vital Signs.:   20-Aug-14 08:00  Temperature Temperature (F) 99  Celsius 37.2  Temperature Source axillary  Pulse Pulse 80  Respirations Respirations 24  Systolic BP Systolic BP 121  Diastolic BP (mmHg) Diastolic BP (mmHg) 47  Mean BP 71  Pulse Ox % Pulse Ox % 99  Oxygen Delivery Ventilator Assisted  Pulse Ox Heart Rate 80   Brief Assessment:  GEN critically ill appearing, sedated on vent, nonverbal.  Wife at bedside.   Cardiac Regular   Respiratory rhonchi  on vent   Gastrointestinal details normal Soft  Bowel sounds normal  mildly distended, PEG site with scant dried blood, no exudates, bumper @6.5   EXTR 1+LEE bilat   Additional Physical Exam Skin:pale, warm, dry   Lab Results: Routine Chem:  20-Aug-14 05:29   Glucose, Serum  154  BUN  41  Creatinine (comp) 1.15  Sodium, Serum 140  Potassium, Serum 3.6  Chloride, Serum  108  CO2, Serum 24  Calcium (Total), Serum 8.6  Anion Gap 8  Osmolality (calc) 293  eGFR (African American) >60  eGFR (Non-African American) >60 (eGFR values <60mL/min/1.73 m2 may be an indication of chronic kidney disease (CKD). Calculated eGFR is useful in patients with stable renal function. The eGFR calculation will not be reliable in acutely ill patients when serum creatinine is changing rapidly. It is not useful in  patients on dialysis. The eGFR calculation may not be applicable to patients at the low and high extremes of body sizes, pregnant women, and vegetarians.)  Magnesium, Serum 2.0 (1.8-2.4 THERAPEUTIC RANGE: 4-7 mg/dL TOXIC: > 10 mg/dL  -----------------------)  Phosphorus, Serum 3.8 (Result(s) reported on 13 May 2013 at 05:58AM.)  Routine Hem:  20-Aug-14 05:29   WBC (CBC)  16.7  RBC (CBC)  3.78  Hemoglobin (CBC)  10.1  Hematocrit (CBC)  30.8  Platelet Count (CBC) 266  MCV 82  MCH 26.8  MCHC 32.9  RDW  14.9  Neutrophil %  72.5  Lymphocyte % 16.9  Monocyte % 7.6  Eosinophil % 2.3  Basophil % 0.7  Neutrophil #  12.1  Lymphocyte # 2.8  Monocyte #  1.3  Eosinophil # 0.4  Basophil # 0.1 (Result(s) reported on 13 May 2013 at 06:03AM.)   Assessment/Plan:  Assessment/Plan:  Assessment s/p PEG placement: site clear, to start tube feeds today   Plan Call if any problems with PEG tube Will sign off, thanks for allowing us to participate in patient's care   Electronic Signatures: Jones, Kandice L (NP)  (Signed 20-Aug-14 10:54)  Authored: Chief Complaint, VITAL SIGNS/ANCILLARY NOTES, Brief Assessment, Lab Results, Assessment/Plan   Last Updated: 20-Aug-14 10:54 by Jones, Kandice L (NP) 

## 2015-01-14 NOTE — Op Note (Signed)
PATIENT NAME:  Matthew Mayer, Matthew Mayer MR#:  [id number] DATE OF BIRTH:  1960-10-08  DATE OF PROCEDURE:  04/30/2013  PREOPERATIVE DIAGNOSIS: Chronic cholecystitis and cholelithiasis.   POSTOPERATIVE DIAGNOSIS: Chronic cholecystitis and cholelithiasis.   OPERATION: Robotically-assisted laparoscopic cholecystectomy, multiport.   ANESTHESIA: General.   SURGEON: Quentin Orealph L. Ely III, MD  OPERATIVE PROCEDURE: With the patient in the supine position, after induction of appropriate general anesthesia, the patient's abdomen was prepped with ChloraPrep and draped with sterile towels. A midline incision was made with the patient in the Trendelenburg position, and the abdomen cannulated with a Veress needle. The appropriate pressures were identified, and the abdomen was then insufflated with 2.5 liters of CO2. The needle was withdrawn, and a bariatric 12 mm port inserted into the peritoneal cavity. Intraperitoneal position was confirmed, and CO2 was re-insufflated. Three 8.5 mm robotic ports were placed, two on the right side and one on the left side under direct vision. The patient was then appropriately positioned in the head up, feet down position and rotated slightly to the left side. The robot was brought to the table and appropriately docked. The instruments were placed under direct vision, and then I moved to the robotic console. The gallbladder was grasped to and retracted superiorly and laterally, exposing the hepatoduodenal ligament. The cystic artery and cystic duct were identified with some dissection. The cystic artery was doubly clipped and divided. However, in dividing the cystic artery, I partially avulsed the cystic duct. The gallbladder then had a bile leak from the gallbladder itself. No large stones were lost. The area was copiously irrigated and multiple small stones suctioned with the suction apparatus. The common duct side of the cystic duct was identified and clipped. The area was irrigated and  observed for any evidence of bile leak, and none was noted. The scope was changed to a 30 down scope to better view this area, and there did not appear to be any evidence of a residual bile leak. The gallbladder was then removed from the bed of the liver using the hook and cautery apparatus. The robot was undocked and the gallbladder removed through the 12 mm port with a regular laparoscopic grasping instrument. The area was again irrigated with 2 more liters of warm saline solution. A Blake drain was placed through the umbilical port and brought out through a separate stab wound and placed in the bed of the liver. The drain was secured with 3-0 nylon. The abdomen was desufflated. Midline incision was closed with 5-0 nylon. The patient was awakened, but with his severe chronic lung disease, was left intubated and returned to the recovery room.   ____________________________ Quentin Orealph L. Ely III, MD rle:OSi D: 04/30/2013 10:53:08 ET T: 04/30/2013 11:55:21 ET JOB#: [id number]  cc: Quentin Orealph L. Ely III, MD, <Dictator> Quentin OreALPH L ELY MD ELECTRONICALLY SIGNED 05/01/2013 16:57

## 2015-02-07 DIAGNOSIS — I10 Essential (primary) hypertension: Secondary | ICD-10-CM | POA: Insufficient documentation

## 2015-02-14 DIAGNOSIS — I5032 Chronic diastolic (congestive) heart failure: Secondary | ICD-10-CM | POA: Insufficient documentation

## 2015-04-06 DIAGNOSIS — N184 Chronic kidney disease, stage 4 (severe): Secondary | ICD-10-CM | POA: Insufficient documentation

## 2015-08-24 ENCOUNTER — Encounter: Payer: BLUE CROSS/BLUE SHIELD | Attending: Surgery | Admitting: Surgery

## 2015-08-24 DIAGNOSIS — Z87891 Personal history of nicotine dependence: Secondary | ICD-10-CM | POA: Diagnosis not present

## 2015-08-24 DIAGNOSIS — E11622 Type 2 diabetes mellitus with other skin ulcer: Secondary | ICD-10-CM | POA: Insufficient documentation

## 2015-08-24 DIAGNOSIS — I129 Hypertensive chronic kidney disease with stage 1 through stage 4 chronic kidney disease, or unspecified chronic kidney disease: Secondary | ICD-10-CM | POA: Diagnosis not present

## 2015-08-24 DIAGNOSIS — N5082 Scrotal pain: Secondary | ICD-10-CM | POA: Insufficient documentation

## 2015-08-24 DIAGNOSIS — Z992 Dependence on renal dialysis: Secondary | ICD-10-CM | POA: Insufficient documentation

## 2015-08-24 DIAGNOSIS — E1140 Type 2 diabetes mellitus with diabetic neuropathy, unspecified: Secondary | ICD-10-CM | POA: Insufficient documentation

## 2015-08-24 DIAGNOSIS — S3130XA Unspecified open wound of scrotum and testes, initial encounter: Secondary | ICD-10-CM | POA: Diagnosis not present

## 2015-08-24 DIAGNOSIS — N189 Chronic kidney disease, unspecified: Secondary | ICD-10-CM | POA: Diagnosis not present

## 2015-08-24 DIAGNOSIS — E669 Obesity, unspecified: Secondary | ICD-10-CM | POA: Insufficient documentation

## 2015-08-24 DIAGNOSIS — Z794 Long term (current) use of insulin: Secondary | ICD-10-CM | POA: Insufficient documentation

## 2015-08-24 DIAGNOSIS — S30813A Abrasion of scrotum and testes, initial encounter: Secondary | ICD-10-CM | POA: Diagnosis not present

## 2015-08-24 DIAGNOSIS — G4730 Sleep apnea, unspecified: Secondary | ICD-10-CM | POA: Diagnosis not present

## 2015-08-24 DIAGNOSIS — E1122 Type 2 diabetes mellitus with diabetic chronic kidney disease: Secondary | ICD-10-CM | POA: Diagnosis not present

## 2015-08-24 DIAGNOSIS — J449 Chronic obstructive pulmonary disease, unspecified: Secondary | ICD-10-CM | POA: Insufficient documentation

## 2015-08-24 DIAGNOSIS — X58XXXA Exposure to other specified factors, initial encounter: Secondary | ICD-10-CM | POA: Diagnosis not present

## 2015-08-24 DIAGNOSIS — I5020 Unspecified systolic (congestive) heart failure: Secondary | ICD-10-CM | POA: Insufficient documentation

## 2015-08-24 DIAGNOSIS — Z880 Allergy status to penicillin: Secondary | ICD-10-CM | POA: Insufficient documentation

## 2015-08-25 ENCOUNTER — Other Ambulatory Visit
Admission: RE | Admit: 2015-08-25 | Discharge: 2015-08-25 | Disposition: A | Discharge status: Home / Self Care | Payer: BLUE CROSS/BLUE SHIELD | Source: Ambulatory Visit | Attending: Surgery | Admitting: Surgery

## 2015-08-25 DIAGNOSIS — Z029 Encounter for administrative examinations, unspecified: Secondary | ICD-10-CM | POA: Diagnosis not present

## 2015-08-25 NOTE — Progress Notes (Signed)
JAHQUEZ, STEFFLER ([id number]) Visit Report for 08/24/2015 Allergy List Details Patient Name: Matthew Mayer, Matthew Mayer Date of Service: 08/24/2015 12:45 PM Medical Record Number: [id number] Patient Account Number: [id number] Date of Birth/Sex: 07-10-1961 (54 y.o. Male) Treating RN: Curtis Sites Primary Care Physician: Maudie Flakes Other Clinician: Referring Physician: Maudie Flakes Treating Physician/Extender: BURNS III, Regis Bill in Treatment: 0 Allergies Active Allergies Penicillins Reaction: anaphalyxis Severity: Severe Augmentin codeine clarithromycin Allergy Notes Electronic Signature(s) Signed: 08/24/2015 5:54:02 PM By: Curtis Sites Entered By: Curtis Sites on 08/24/2015 13:11:34 Matthew Mayer ([id number]) -------------------------------------------------------------------------------- Arrival Information Details Patient Name: Matthew Mayer Date of Service: 08/24/2015 12:45 PM Medical Record Number: [id number] Patient Account Number: [id number] Date of Birth/Sex: [birth year] (54 y.o. Male) Treating RN: Curtis Sites Primary Care Physician: Maudie Flakes Other Clinician: Referring Physician: Maudie Flakes Treating Physician/Extender: BURNS III, Regis Bill in Treatment: 0 Visit Information Patient Arrived: Ambulatory Arrival Time: 13:09 Accompanied By: spouse Transfer Assistance: None Patient Identification Verified: Yes Secondary Verification Process Yes Completed: Patient Has Alerts: Yes Patient Alerts: DMII History Since Last Visit Added or deleted any medications: No Any new allergies or adverse reactions: No Had a fall or experienced change in activities of daily living that may affect risk of falls: No Signs or symptoms of abuse/neglect since last visito No Hospitalized since last visit: No Electronic Signature(s) Signed: 08/24/2015 5:54:02 PM By: Curtis Sites Entered By: Curtis Sites on 08/24/2015 13:10:10 Matthew Mayer ([id number]) -------------------------------------------------------------------------------- Clinic Level of Care Assessment Details Patient Name: Matthew Mayer Date of Service: 08/24/2015 12:45 PM Medical Record Number: [id number] Patient Account Number: [id number] Date of Birth/Sex: 07-12-1961 (54 y.o. Male) Treating RN: Curtis Sites Primary Care Physician: Maudie Flakes Other Clinician: Referring Physician: Maudie Flakes Treating Physician/Extender: BURNS III, Regis Bill in Treatment: 0 Clinic Level of Care Assessment Items TOOL 1 Quantity Score  - Use when EandM and Procedure is performed on INITIAL visit 0 ASSESSMENTS - Nursing Assessment / Reassessment X - General Physical Exam (combine w/ comprehensive assessment (listed just 1 20 below) when performed on new pt. evals) X - Comprehensive Assessment (HX, ROS, Risk Assessments, Wounds Hx, etc.) 1 25 ASSESSMENTS - Wound and Skin Assessment / Reassessment  - Dermatologic / Skin Assessment (not related to wound area) 0 ASSESSMENTS - Ostomy and/or Continence Assessment and Care  - Incontinence Assessment and Management 0  - Ostomy Care Assessment and Management (repouching, etc.) 0 PROCESS - Coordination of Care X - Simple Patient / Family Education for ongoing care 1 15  - Complex (extensive) Patient / Family Education for ongoing care 0  - Staff obtains Chiropractor, Records, Test Results / Process Orders 0  - Staff telephones HHA, Nursing Homes / Clarify orders / etc 0  - Routine Transfer to another Facility (non-emergent condition) 0  - Routine Hospital Admission (non-emergent condition) 0 X - New Admissions / Manufacturing engineer / Ordering NPWT, Apligraf, etc. 1 15  - Emergency Hospital Admission (emergent condition) 0 PROCESS - Special Needs  - Pediatric / Minor Patient Management 0  - Isolation Patient Management 0 BLAYNE, FRANKIE. ([id number])  - Hearing / Language / Visual  special needs 0  - Assessment of Community assistance (transportation, D/C planning, etc.) 0  - Additional assistance / Altered mentation 0  - Support Surface(s) Assessment (bed, cushion, seat, etc.) 0 INTERVENTIONS - Miscellaneous  - External ear exam 0  - Patient Transfer (multiple staff / Nurse, adult / Similar devices) 0  - Simple Staple / Suture removal (25 or less)  0  - Complex Staple / Suture removal (26 or more) 0  - Hypo/Hyperglycemic Management (do not check if billed separately) 0  - Ankle / Brachial Index (ABI) - do not check if billed separately 0 Has the patient been seen at the hospital within the last three years: Yes Total Score: 75 Level Of Care: New/Established - Level 2 Electronic Signature(s) Signed: 08/24/2015 5:54:02 PM By: Curtis Sites Entered By: Curtis Sites on 08/24/2015 14:21:56 Matthew Mayer ([id number]) -------------------------------------------------------------------------------- Encounter Discharge Information Details Patient Name: Matthew Mayer Date of Service: 08/24/2015 12:45 PM Medical Record Number: [id number] Patient Account Number: [id number] Date of Birth/Sex: June 26, 1961 (54 y.o. Male) Treating RN: Curtis Sites Primary Care Physician: Maudie Flakes Other Clinician: Referring Physician: Maudie Flakes Treating Physician/Extender: BURNS III, Regis Bill in Treatment: 0 Encounter Discharge Information Items Discharge Pain Level: 0 Discharge Condition: Stable Ambulatory Status: Ambulatory Discharge Destination: Home Transportation: Private Auto Accompanied By: spouse Schedule Follow-up Appointment: Yes Medication Reconciliation completed and provided to Patient/Care No Delbra Zellars: Provided on Clinical Summary of Care: 08/24/2015 Form Type Recipient Paper Patient DT Electronic Signature(s) Signed: 08/24/2015 5:54:02 PM By: Curtis Sites Previous Signature: 08/24/2015 1:44:05 PM Version By: Gwenlyn Perking Entered By: Curtis Sites on 08/24/2015 14:27:58 Matthew Mayer ([id number]) -------------------------------------------------------------------------------- Multi Wound Chart Details Patient Name: Matthew Mayer Date of Service: 08/24/2015 12:45 PM Medical Record Number: [id number] Patient Account Number: [id number] Date of Birth/Sex: 31-Oct-1960 (54 y.o. Male) Treating RN: Curtis Sites Primary Care Physician: Maudie Flakes Other Clinician: Referring Physician: Maudie Flakes Treating Physician/Extender: BURNS III, WALTER Weeks in Treatment: 0 Vital Signs Height(in): 67 Pulse(bpm): 72 Weight(lbs): 315 Blood Pressure 128/55 (mmHg): Body Mass Index(BMI): 49 Temperature(F): 98.3 Respiratory Rate 20 (breaths/min): Photos: [2:No Photos] [N/A:N/A] Wound Location: [2:Right Groin] [N/A:N/A] Wounding Event: [2:Not Known] [N/A:N/A] Primary Etiology: [2:To be determined] [N/A:N/A] Comorbid History: [2:Chronic Obstructive Pulmonary Disease (COPD), Sleep Apnea, Congestive Heart Failure, Hypertension, Type II Diabetes, Neuropathy] [N/A:N/A] Date Acquired: [2:08/30/2014] [N/A:N/A] Weeks of Treatment: [2:0] [N/A:N/A] Wound Status: [2:Open] [N/A:N/A] Measurements L x W x D 1x1.3x0.1 [N/A:N/A] (cm) Area (cm) : [2:1.021] [N/A:N/A] Volume (cm) : [2:0.102] [N/A:N/A] Classification: [2:Partial Thickness] [N/A:N/A] HBO Classification: [2:Grade 1] [N/A:N/A] Exudate Amount: [2:Small] [N/A:N/A] Exudate Type: [2:Serous] [N/A:N/A] Exudate Color: [2:amber] [N/A:N/A] Wound Margin: [2:Flat and Intact] [N/A:N/A] Granulation Amount: [2:Large (67-100%)] [N/A:N/A] Granulation Quality: [2:Red] [N/A:N/A] Necrotic Amount: [2:Small (1-33%)] [N/A:N/A] Exposed Structures: [2:Fascia: No Fat: No Tendon: No Muscle: No] [N/A:N/A] Joint: No Bone: No Limited to Skin Breakdown Epithelialization: None N/A N/A Periwound Skin Texture: Edema: No N/A N/A Excoriation: No Induration:  No Callus: No Crepitus: No Fluctuance: No Friable: No Rash: No Scarring: No Periwound Skin Maceration: No N/A N/A Moisture: Moist: No Dry/Scaly: No Periwound Skin Color: Atrophie Blanche: No N/A N/A Cyanosis: No Ecchymosis: No Erythema: No Hemosiderin Staining: No Mottled: No Pallor: No Rubor: No Tenderness on Yes N/A N/A Palpation: Wound Preparation: Ulcer Cleansing: N/A N/A Rinsed/Irrigated with Saline Topical Anesthetic Applied: Other: lidocaine 4% Treatment Notes Electronic Signature(s) Signed: 08/24/2015 5:54:02 PM By: Curtis Sites Entered By: Curtis Sites on 08/24/2015 13:28:04 Matthew Mayer ([id number]) -------------------------------------------------------------------------------- Multi-Disciplinary Care Plan Details Patient Name: Matthew Mayer Date of Service: 08/24/2015 12:45 PM Medical Record Number: [id number] Patient Account Number: [id number] Date of Birth/Sex: 02-19-1961 (54 y.o. Male) Treating RN: Curtis Sites Primary Care Physician: Maudie Flakes Other Clinician: Referring Physician: Maudie Flakes Treating Physician/Extender: BURNS III, Regis Bill in Treatment: 0 Active Inactive Wound/Skin Impairment Nursing Diagnoses: Impaired tissue integrity Knowledge deficit related to ulceration/compromised skin integrity Goals:  Patient/caregiver will verbalize understanding of skin care regimen Date Initiated: 08/24/2015 Goal Status: Active Ulcer/skin breakdown will have a volume reduction of 30% by week 4 Date Initiated: 08/24/2015 Goal Status: Active Ulcer/skin breakdown will have a volume reduction of 50% by week 8 Date Initiated: 08/24/2015 Goal Status: Active Ulcer/skin breakdown will have a volume reduction of 80% by week 12 Date Initiated: 08/24/2015 Goal Status: Active Ulcer/skin breakdown will heal within 14 weeks Date Initiated: 08/24/2015 Goal Status: Active Interventions: Assess patient/caregiver ability to  perform ulcer/skin care regimen upon admission and as needed Assess ulceration(s) every visit Notes: Electronic Signature(s) Signed: 08/24/2015 5:54:02 PM By: Curtis Sitesorthy, Joanna Entered By: Curtis Sitesorthy, Joanna on 08/24/2015 13:27:51 Matthew OharaERRELL, Matthew W. ([id number]) -------------------------------------------------------------------------------- Patient/Caregiver Education Details Patient Name: Matthew OharaERRELL, Matthew W. Date of Service: 08/24/2015 12:45 PM Medical Record Number: [id number] Patient Account Number: [id number] Date of Birth/Gender: 04/15/1961 [age] y.o. Male) Treating RN: Curtis Sitesorthy, Joanna Primary Care Physician: Maudie FlakesFeldpausch, Dale Other Clinician: Referring Physician: Maudie FlakesFeldpausch, Dale Treating Physician/Extender: BURNS III, Regis BillWALTER Weeks in Treatment: 0 Education Assessment Education Provided To: Patient and Caregiver Education Topics Provided Wound/Skin Impairment: Handouts: Other: wound care as ordered Methods: Demonstration, Explain/Verbal Responses: State content correctly Electronic Signature(s) Signed: 08/24/2015 5:54:02 PM By: Curtis Sitesorthy, Joanna Entered By: Curtis Sitesorthy, Joanna on 08/24/2015 14:28:13 Matthew OharaERRELL, Matthew W. ([id number]) -------------------------------------------------------------------------------- Wound Assessment Details Patient Name: Matthew OharaERRELL, Matthew W. Date of Service: 08/24/2015 12:45 PM Medical Record Number: [id number] Patient Account Number: [id number] Date of Birth/Sex: 04/15/1961 [age] y.o. Male) Treating RN: Curtis Sitesorthy, Joanna Primary Care Physician: Maudie FlakesFeldpausch, Dale Other Clinician: Referring Physician: Maudie FlakesFeldpausch, Dale Treating Physician/Extender: BURNS III, WALTER Weeks in Treatment: 0 Wound Status Wound Number: 2 Primary Pressure Ulcer Etiology: Wound Location: Right Scrotum Wound Open Wounding Event: Not Known Status: Date Acquired: 08/30/2014 Comorbid Chronic Obstructive Pulmonary Disease Weeks Of Treatment: 0 History: (COPD), Sleep Apnea, Congestive Clustered  Wound: No Heart Failure, Hypertension, Type II Diabetes, Neuropathy Photos Photo Uploaded By: Curtis Sitesorthy, Joanna on 08/24/2015 16:56:03 Wound Measurements Length: (cm) 1 Width: (cm) 1.3 Depth: (cm) 0.1 Area: (cm) 1.021 Volume: (cm) 0.102 % Reduction in Area: 0% % Reduction in Volume: 0% Epithelialization: None Tunneling: No Undermining: No Wound Description Classification: Category/Stage III Wound Margin: Flat and Intact Exudate Amount: Small Exudate Type: Serous Exudate Color: amber Foul Odor After Cleansing: No Wound Bed Granulation Amount: Large (67-100%) Exposed Structure Granulation Quality: Red Fascia Exposed: No Necrotic Amount: Small (1-33%) Fat Layer Exposed: No Matthew OharaERRELL, Matthew W. ([id number]) Necrotic Quality: Adherent Slough Tendon Exposed: No Muscle Exposed: No Joint Exposed: No Bone Exposed: No Limited to Skin Breakdown Periwound Skin Texture Texture Color No Abnormalities Noted: No No Abnormalities Noted: No Callus: No Atrophie Blanche: No Crepitus: No Cyanosis: No Excoriation: No Ecchymosis: No Fluctuance: No Erythema: No Friable: No Hemosiderin Staining: No Induration: No Mottled: No Localized Edema: No Pallor: No Rash: No Rubor: No Scarring: No Temperature / Pain Moisture Tenderness on Palpation: Yes No Abnormalities Noted: No Dry / Scaly: No Maceration: No Moist: No Wound Preparation Ulcer Cleansing: Rinsed/Irrigated with Saline Topical Anesthetic Applied: Other: lidocaine 4%, Treatment Notes Wound #2 (Right Scrotum) 1. Cleansed with: Clean wound with Normal Saline 2. Anesthetic Topical Lidocaine 4% cream to wound bed prior to debridement 4. Dressing Applied: Prisma Ag 5. Secondary Dressing Applied Dry Gauze Electronic Signature(s) Signed: 08/24/2015 5:54:02 PM By: Curtis Sitesorthy, Joanna Entered By: Curtis Sitesorthy, Joanna on 08/24/2015 13:36:14 Matthew OharaERRELL, Matthew W.  ([id number]) -------------------------------------------------------------------------------- Vitals Details Patient Name: Matthew OharaERRELL, Matthew W. Date of Service: 08/24/2015 12:45 PM Medical Record Number: [id number] Patient Account Number: [id number] Date of  Birth/Sex: [birth year] (54 y.o. Male) Treating RN: Curtis Sites Primary Care Physician: Maudie Flakes Other Clinician: Referring Physician: Maudie Flakes Treating Physician/Extender: BURNS III, Regis Bill in Treatment: 0 Vital Signs Time Taken: 13:19 Temperature (F): 98.3 Height (in): 67 Pulse (bpm): 72 Source: Stated Respiratory Rate (breaths/min): 20 Weight (lbs): 315 Blood Pressure (mmHg): 128/55 Source: Measured Reference Range: 80 - 120 mg / dl Body Mass Index (BMI): 49.3 Electronic Signature(s) Signed: 08/24/2015 5:54:02 PM By: Curtis Sites Entered By: Curtis Sites on 08/24/2015 13:20:28

## 2015-08-25 NOTE — Progress Notes (Signed)
Matthew Mayer, Matthew W. ([id number]) Visit Report for 08/24/2015 Abuse/Suicide Risk Screen Details Patient Name: Matthew Mayer, [name] Date of Service: 08/24/2015 12:45 PM Medical Record Number: [id number] Patient Account Number: [id number] Date of Birth/Sex: Mar 16, 1961 (54 y.o. Male) Treating RN: Curtis Sitesorthy, Joanna Primary Care Physician: Maudie FlakesFeldpausch, Dale Other Clinician: Referring Physician: Maudie FlakesFeldpausch, Dale Treating Physician/Extender: BURNS III, Regis BillWALTER Weeks in Treatment: 0 Abuse/Suicide Risk Screen Items Answer ABUSE/SUICIDE RISK SCREEN: Has anyone close to you tried to hurt or harm you recentlyo No Do you feel uncomfortable with anyone in your familyo No Has anyone forced you do things that you didnot want to doo No Do you have any thoughts of harming yourselfo No Patient displays signs or symptoms of abuse and/or neglect. No Electronic Signature(s) Signed: 08/24/2015 5:54:02 PM By: Curtis Sitesorthy, Joanna Entered By: Curtis Sitesorthy, Joanna on 08/24/2015 13:15:16 Matthew Mayer, Matthew W. ([id number]) -------------------------------------------------------------------------------- Activities of Daily Living Details Patient Name: Matthew Mayer, Matthew W. Date of Service: 08/24/2015 12:45 PM Medical Record Number: [id number] Patient Account Number: [id number] Date of Birth/Sex: Mar 16, 1961 (54 y.o. Male) Treating RN: Curtis Sitesorthy, Joanna Primary Care Physician: Maudie FlakesFeldpausch, Dale Other Clinician: Referring Physician: Maudie FlakesFeldpausch, Dale Treating Physician/Extender: BURNS III, Regis BillWALTER Weeks in Treatment: 0 Activities of Daily Living Items Answer Activities of Daily Living (Please select one for each item) Drive Automobile Not Able Take Medications Completely Able Use Telephone Completely Able Care for Appearance Completely Able Use Toilet Completely Able Bath / Shower Completely Able Dress Self Completely Able Feed Self Completely Able Walk Completely Able Get In / Out Bed Completely Able Housework Completely Able Prepare  Meals Completely Able Handle Money Completely Able Shop for Self Completely Able Electronic Signature(s) Signed: 08/24/2015 5:54:02 PM By: Curtis Sitesorthy, Joanna Entered By: Curtis Sitesorthy, Joanna on 08/24/2015 13:15:41 Matthew Mayer, [name] ([id number]) -------------------------------------------------------------------------------- Education Assessment Details Patient Name: Matthew Mayer, Matthew W. Date of Service: 08/24/2015 12:45 PM Medical Record Number: [id number] Patient Account Number: [id number] Date of Birth/Sex: Mar 16, 1961 [age] y.o. Male) Treating RN: Curtis Sitesorthy, Joanna Primary Care Physician: Maudie FlakesFeldpausch, Dale Other Clinician: Referring Physician: Maudie FlakesFeldpausch, Dale Treating Physician/Extender: BURNS III, Regis BillWALTER Weeks in Treatment: 0 Primary Learner Assessed: Patient Learning Preferences/Education Level/Primary Language Learning Preference: Explanation, Demonstration Highest Education Level: High School Preferred Language: English Cognitive Barrier Assessment/Beliefs Language Barrier: No Translator Needed: No Memory Deficit: No Emotional Barrier: No Cultural/Religious Beliefs Affecting Medical No Care: Physical Barrier Assessment Impaired Vision: No Impaired Hearing: No Decreased Hand dexterity: No Knowledge/Comprehension Assessment Knowledge Level: Medium Comprehension Level: Medium Ability to understand written Medium instructions: Ability to understand verbal Medium instructions: Motivation Assessment Anxiety Level: Calm Cooperation: Cooperative Education Importance: Acknowledges Need Interest in Health Problems: Asks Questions Perception: Coherent Willingness to Engage in Self- Medium Management Activities: Readiness to Engage in Self- Medium Management Activities: Electronic Signature(s) Matthew Mayer, Matthew W. ([id number]) Signed: 08/24/2015 5:54:02 PM By: Curtis Sitesorthy, Joanna Entered By: Curtis Sitesorthy, Joanna on 08/24/2015 13:16:13 Matthew Mayer, Matthew W.  ([id number]) -------------------------------------------------------------------------------- Fall Risk Assessment Details Patient Name: Matthew Mayer, Matthew W. Date of Service: 08/24/2015 12:45 PM Medical Record Number: [id number] Patient Account Number: [id number] Date of Birth/Sex: Mar 16, 1961 [age] y.o. Male) Treating RN: Curtis Sitesorthy, Joanna Primary Care Physician: Maudie FlakesFeldpausch, Dale Other Clinician: Referring Physician: Maudie FlakesFeldpausch, Dale Treating Physician/Extender: BURNS III, Regis BillWALTER Weeks in Treatment: 0 Fall Risk Assessment Items Have you had 2 or more falls in the last 12 monthso 0 No Have you had any fall that resulted in injury in the last 12 monthso 0 No FALL RISK ASSESSMENT: History of falling - immediate or within 3 months 0 No Secondary diagnosis 0 No Ambulatory aid None/bed rest/wheelchair/nurse 0 Yes Crutches/cane/walker  0 No Furniture 0 No IV Access/Saline Lock 0 No Gait/Training Normal/bed rest/immobile 0 Yes Weak 0 No Impaired 0 No Mental Status Oriented to own ability 0 Yes Electronic Signature(s) Signed: 08/24/2015 5:54:02 PM By: Curtis Sites Entered By: Curtis Sites on 08/24/2015 13:17:22 Matthew Ohara ([id number]) -------------------------------------------------------------------------------- Nutrition Risk Assessment Details Patient Name: Matthew Ohara Date of Service: 08/24/2015 12:45 PM Medical Record Number: [id number] Patient Account Number: [id number] Date of Birth/Sex: Jan 18, 1961 (54 y.o. Male) Treating RN: Curtis Sites Primary Care Physician: Maudie Flakes Other Clinician: Referring Physician: Maudie Flakes Treating Physician/Extender: BURNS III, WALTER Weeks in Treatment: 0 Height (in): 66 Weight (lbs): 222 Body Mass Index (BMI): 35.8 Nutrition Risk Assessment Items NUTRITION RISK SCREEN: I have an illness or condition that made me change the kind and/or 0 No amount of food I eat I eat fewer than two meals per day 0 No I eat few  fruits and vegetables, or milk products 0 No I have three or more drinks of beer, liquor or wine almost every day 0 No I have tooth or mouth problems that make it hard for me to eat 0 No I don't always have enough money to buy the food I need 0 No I eat alone most of the time 0 No I take three or more different prescribed or over-the-counter drugs a 1 Yes day Without wanting to, I have lost or gained 10 pounds in the last six 0 No months I am not always physically able to shop, cook and/or feed myself 0 No Nutrition Protocols Good Risk Protocol 0 No interventions needed Moderate Risk Protocol Electronic Signature(s) Signed: 08/24/2015 5:54:02 PM By: Curtis Sites Entered By: Curtis Sites on 08/24/2015 13:17:28

## 2015-08-25 NOTE — Progress Notes (Signed)
Matthew Mayer, Saad W. ([id number]) Visit Report for 08/24/2015 Chief Complaint Document Details Patient Name: Matthew Mayer, Matthew W. Date of Service: 08/24/2015 12:45 PM Medical Record Number: [id number] Patient Account Number: [id number] Date of Birth/Sex: 1960/11/05 (54 y.o. Male) Treating RN: Curtis Sitesorthy, Joanna Primary Care Physician: Maudie FlakesFeldpausch, Dale Other Clinician: Referring Physician: Maudie FlakesFeldpausch, Dale Treating Physician/Extender: BURNS III, Regis BillWALTER Weeks in Treatment: 0 Information Obtained from: Patient Chief Complaint Scrotal ulcer Electronic Signature(s) Signed: 08/24/2015 4:02:10 PM By: Madelaine BhatBurns, III, Leo Weyandt MD Entered By: Madelaine BhatBurns, III, Zophia Marrone on 08/24/2015 13:51:51 Matthew Mayer, Matthew W. ([id number]) -------------------------------------------------------------------------------- Debridement Details Patient Name: Matthew Mayer, Matthew W. Date of Service: 08/24/2015 12:45 PM Medical Record Number: [id number] Patient Account Number: [id number] Date of Birth/Sex: 1960/11/05 (54 y.o. Male) Treating RN: Curtis Sitesorthy, Joanna Primary Care Physician: Maudie FlakesFeldpausch, Dale Other Clinician: Referring Physician: Maudie FlakesFeldpausch, Dale Treating Physician/Extender: BURNS III, Regis BillWALTER Weeks in Treatment: 0 Debridement Performed for Wound #2 Right Scrotum Assessment: Performed By: Physician BURNS III, Melanie CrazierWALTER W., MD Debridement: Open Wound/Selective Debridement Selective Description: Pre-procedure Yes Verification/Time Out Taken: Start Time: 13:33 Pain Control: Lidocaine 4% Topical Solution Level: Non-Viable Tissue Total Area Debrided (L x 1 (cm) x 1.3 (cm) = 1.3 (cm) W): Tissue and other Non-Viable, Fibrin/Slough, Subcutaneous material debrided: Instrument: Curette Bleeding: Minimum Hemostasis Achieved: Pressure End Time: 13:35 Procedural Pain: 0 Post Procedural Pain: 0 Response to Treatment: Procedure was tolerated well Post Debridement Measurements of Total Wound Length: (cm) 1 Stage: Category/Stage  III Width: (cm) 1.3 Depth: (cm) 0.2 Volume: (cm) 0.204 Post Procedure Diagnosis Same as Pre-procedure Electronic Signature(s) Signed: 08/24/2015 4:02:10 PM By: Madelaine BhatBurns, III, Nakita Santerre MD Signed: 08/24/2015 5:54:02 PM By: Curtis Sitesorthy, Joanna Entered By: Madelaine BhatBurns, III, Ciena Sampley on 08/24/2015 13:51:37 Matthew Mayer, Dmauri W. ([id number]) -------------------------------------------------------------------------------- HPI Details Patient Name: Matthew Mayer, Matthew W. Date of Service: 08/24/2015 12:45 PM Medical Record Number: [id number] Patient Account Number: [id number] Date of Birth/Sex: 1960/11/05 69(54 y.o. Male) Treating RN: Curtis Sitesorthy, Joanna Primary Care Physician: Maudie FlakesFeldpausch, Dale Other Clinician: Referring Physician: Maudie FlakesFeldpausch, Dale Treating Physician/Extender: BURNS III, Regis BillWALTER Weeks in Treatment: 0 History of Present Illness Location: Patient has a ulcer to Sacrum HPI Description: Pleasant 54 year old with history of type 2 diabetes (hemoglobin A1c 11.5 in November 2016), COPD, congestive heart failure, chronic kidney disease, and obesity. Seen previously in the wound clinic for a sacral pressure ulcer, which remains healed (since January 2015). He developed an ulceration on the right side of his scrotum over a year ago which has not improved with multiple antibiotics and topical antifungal treatment. He reports moderate pain. Mild drainage. No fever or chills. Electronic Signature(s) Signed: 08/24/2015 4:02:10 PM By: Madelaine BhatBurns, III, Rudy Luhmann MD Entered By: Madelaine BhatBurns, III, Sanjuan Sawa on 08/24/2015 13:56:16 Matthew Mayer, Matthew W. ([id number]) -------------------------------------------------------------------------------- Physical Exam Details Patient Name: Matthew Mayer, Matthew W. Date of Service: 08/24/2015 12:45 PM Medical Record Number: [id number] Patient Account Number: [id number] Date of Birth/Sex: 1960/11/05 27(54 y.o. Male) Treating RN: Curtis Sitesorthy, Joanna Primary Care Physician: Maudie FlakesFeldpausch, Dale Other Clinician: Referring  Physician: Maudie FlakesFeldpausch, Dale Treating Physician/Extender: BURNS III, Allina Riches Weeks in Treatment: 0 Constitutional . Pulse regular. Respirations normal and unlabored. Afebrile. Marland Kitchen. Respiratory . Cardiovascular Femoral Pulses WNL, no bruits. Integumentary (Hair, Skin) .Marland Kitchen. Neurological Sensation normal to touch, pin,and vibration. Psychiatric Judgement and insight Intact.. Oriented times 3.. No evidence of depression, anxiety, or agitation.. Notes Right sided scrotal ulceration. Full-thickness, though relatively superficial. No underlying abscess, sinus tract, or fistula. Consistent with pressure ulceration. No adjacent ulcer on the thigh. No cellulitis. Minimal surrounding erythema. Electronic Signature(s) Signed: 08/24/2015 4:02:10 PM By: Madelaine BhatBurns, III, Allah Reason MD Entered By: Madelaine BhatBurns, III, Denise Washburn  on 08/24/2015 13:57:45 Matthew Mayer, Matthew Mayer ([id number]) -------------------------------------------------------------------------------- Physician Orders Details Patient Name: Matthew Mayer, Matthew Mayer Date of Service: 08/24/2015 12:45 PM Medical Record Number: [id number] Patient Account Number: [id number] Date of Birth/Sex: 14-Jul-1961 (54 y.o. Male) Treating RN: Curtis Sites Primary Care Physician: Maudie Flakes Other Clinician: Referring Physician: Maudie Flakes Treating Physician/Extender: BURNS III, Regis Bill in Treatment: 0 Verbal / Phone Orders: Yes Clinician: Curtis Sites Read Back and Verified: Yes Diagnosis Coding Wound Cleansing Wound #2 Right Scrotum o Clean wound with Normal Saline. Anesthetic Wound #2 Right Scrotum o Topical Lidocaine 4% cream applied to wound bed prior to debridement Primary Wound Dressing Wound #2 Right Scrotum o Prisma Ag Secondary Dressing Wound #2 Right Scrotum o Dry Gauze Dressing Change Frequency Wound #2 Right Scrotum o Change dressing every day. - and as needed Follow-up Appointments Wound #2 Right Scrotum o Return Appointment  in 1 week. Medications-please add to medication list. Wound #2 Right Scrotum o Other: - lidocaine 4% Laboratory o Culture and Sensitivity - Right scrotum ZACHERY, NISWANDER ([id number]) oooo Electronic Signature(s) Signed: 08/24/2015 3:06:45 PM By: Curtis Sites Signed: 08/24/2015 4:02:10 PM By: Madelaine Bhat MD Entered By: Curtis Sites on 08/24/2015 15:06:44 Matthew Mayer ([id number]) -------------------------------------------------------------------------------- Problem List Details Patient Name: Matthew Mayer Date of Service: 08/24/2015 12:45 PM Medical Record Number: [id number] Patient Account Number: [id number] Date of Birth/Sex: 04-06-1961 (54 y.o. 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 08/24/2015 Yes disease J44.9 Chronic obstructive pulmonary disease, unspecified 08/24/2015 Yes G47.30 Sleep apnea, unspecified 08/24/2015 Yes E66.9 Obesity, unspecified 08/24/2015 Yes I50.20 Unspecified systolic (congestive) heart failure 08/24/2015 Yes Inactive Problems Resolved Problems Matthew Mayer, CIULLO ([id number]) Electronic Signature(s) Signed: 08/24/2015 4:02:10 PM By: Madelaine Bhat MD Entered By: Madelaine Bhat on 08/24/2015 13:51:01 Matthew Mayer ([id number]) -------------------------------------------------------------------------------- Progress Note/History and Physical Details Patient  Name: Matthew Mayer Date of Service: 08/24/2015 12:45 PM Medical Record Number: [id number] Patient Account Number: [id number] Date of Birth/Sex: 05/17/1961 (54 y.o. Male) Treating RN: Curtis Sites Primary Care Physician: Maudie Flakes Other Clinician: Referring Physician: Maudie Flakes Treating Physician/Extender: BURNS III, Regis Bill in Treatment: 0 Subjective Chief Complaint Information obtained from Patient Scrotal ulcer History of Present Illness (HPI) The following HPI elements were documented for the patient's wound: Location: Patient has a ulcer to Sacrum Pleasant 54 year old with history of type 2 diabetes (hemoglobin A1c 11.5 in November 2016), COPD, congestive heart failure, chronic kidney disease, and obesity. Seen previously in the wound clinic for a sacral pressure ulcer, which remains healed (since January 2015). He developed an ulceration on the right side of his scrotum over a year ago which has not improved with multiple antibiotics and topical antifungal treatment. He reports moderate pain. Mild drainage. No fever or chills. Wound History Patient presents with 1 open wound that has been present for approximately 1 year. Patient has been treating wound in the following manner: open. Laboratory tests have not been performed in the last month. Patient reportedly has not tested positive for an antibiotic resistant organism. Patient reportedly has not tested positive for osteomyelitis. Patient  reportedly has not had testing performed to evaluate circulation in the legs. Patient History Information obtained from Patient, Caregiver. Allergies Penicillins (Severity: Severe, Reaction: anaphalyxis), Augmentin, codeine, clarithromycin Family History Diabetes - Paternal Grandparents, Father, Siblings, Hypertension - Mother, Father, Siblings, Lung Disease - Siblings, Stroke - Father, Siblings, . Social History Former smoker, Marital Status - Married, Alcohol  Use - Rarely, Drug Use - No History, Caffeine Use - Daily. Medical History KASS, HERBERGER ([id number]) Respiratory Patient has history of Chronic Obstructive Pulmonary Disease (COPD), Sleep Apnea Cardiovascular Patient has history of Congestive Heart Failure, Hypertension Endocrine Patient has history of Type II Diabetes - with CKD Denies history of Type I Diabetes Neurologic Patient has history of Neuropathy Oncologic Denies history of Received Chemotherapy, Received Radiation Patient is treated with Insulin, Oral Agents. Blood sugar is tested. Blood sugar results noted at the following times: Breakfast - 233. Hospitalization/Surgery History - 04/30/2013, [facility], [facility]. Medical And Surgical History Notes Constitutional Symptoms (General Health) 04/30/2013 [facility] surgery, couldn't breath on his own, CCU, trach 14 days into it. Transferred to [facility] fro rehab. Home 2 weeks. 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 Eyes The patient has no complaints or symptoms. Ear/Nose/Mouth/Throat The patient has no complaints or symptoms. Hematologic/Lymphatic The patient has no complaints or symptoms. Gastrointestinal The patient has no complaints or symptoms. Genitourinary Complains or has symptoms of Kidney failure/ Dialysis. Immunological The patient has no complaints or symptoms. Integumentary (Skin) The patient has no complaints or symptoms. Musculoskeletal The patient has no complaints or symptoms. Psychiatric The patient has no complaints or symptoms. BERGEN, MAGNER ([id number]) Objective Constitutional Pulse regular. Respirations normal and unlabored. Afebrile. Vitals Time Taken: 1:19 PM, Height: 67 in, Source: Stated, Weight: 315 lbs, Source: Measured, BMI: 49.3, Temperature: 98.3 F, Pulse: 72 bpm, Respiratory Rate: 20  breaths/min, Blood Pressure: 128/55 mmHg. Cardiovascular Femoral Pulses WNL, no bruits. Neurological Sensation normal to touch, pin,and vibration. Psychiatric Judgement and insight Intact.. Oriented times 3.. No evidence of depression, anxiety, or agitation.. General Notes: Right sided scrotal ulceration. Full-thickness, though relatively superficial. No underlying abscess, sinus tract, or fistula. Consistent with pressure ulceration. No adjacent ulcer on the thigh. No cellulitis. Minimal surrounding erythema. Integumentary (Hair, Skin) Wound #2 status is Open. Original cause of wound was Not Known. The wound is located on the Right Scrotum. The wound measures 1cm length x 1.3cm width x 0.1cm depth; 1.021cm^2 area and 0.102cm^3 volume. The wound is limited to skin breakdown. There is no tunneling or undermining noted. There is a small amount of serous drainage noted. The wound margin is flat and intact. There is large (67-100%) red granulation within the wound bed. There is a small (1-33%) amount of necrotic tissue within the wound bed including Adherent Slough. The periwound skin appearance did not exhibit: Callus, Crepitus, Excoriation, Fluctuance, Friable, Induration, Localized Edema, Rash, Scarring, Dry/Scaly, Maceration, Moist, Atrophie Blanche, Cyanosis, Ecchymosis, Hemosiderin Staining, Mottled, Pallor, Rubor, Erythema. The periwound has tenderness on palpation. Assessment Active Problems ICD-10 S31.30XA - Unspecified open wound of scrotum and testes, initial encounter S30.813A - Abrasion of scrotum and testes, initial encounter N50.82 - Scrotal pain LEYTON, MAGOON ([id number]) E11.622 - Type 2 diabetes mellitus with other skin ulcer E11.22 - Type 2 diabetes mellitus with diabetic chronic kidney disease J44.9 - Chronic obstructive pulmonary disease, unspecified G47.30 - Sleep apnea, unspecified E66.9 - Obesity, unspecified I50.20 - Unspecified systolic (congestive) heart  failure Chronic scrotal ulcer  of unclear etiology. Suspect pressure ulceration. Malignancy cannot be ruled out. Procedures Wound #2 Wound #2 is a Pressure Ulcer located on the Right Scrotum . There was a Non-Viable Tissue Open Wound/Selective [phone]) debridement with total area of 1.3 sq cm performed by BURNS III, Melanie Crazier., MD. with the following instrument(s): Curette to remove Non-Viable tissue/material including Fibrin/Slough and Subcutaneous after achieving pain control using Lidocaine 4% Topical Solution. A time out was conducted prior to the start of the procedure. A Minimum amount of bleeding was controlled with Pressure. The procedure was tolerated well with a pain level of 0 throughout and a pain level of 0 following the procedure. Post Debridement Measurements: 1cm length x 1.3cm width x 0.2cm depth; 0.204cm^3 volume. Post debridement Stage noted as Category/Stage III. Post procedure Diagnosis Wound #2: Same as Pre-Procedure Plan Wound Cleansing: Wound #2 Right Scrotum: Clean wound with Normal Saline. Anesthetic: Wound #2 Right Scrotum: Topical Lidocaine 4% cream applied to wound bed prior to debridement Primary Wound Dressing: Wound #2 Right Scrotum: Prisma Ag Secondary Dressing: Wound #2 Right Scrotum: Dry Gauze Dressing Change Frequency: Wound #2 Right Scrotum: Change dressing every day. - and as needed DEREK, LAUGHTER ([id number]) Follow-up Appointments: Wound #2 Right Scrotum: Return Appointment in 1 week. Medications-please add to medication list.: Wound #2 Right Scrotum: Other: - lidocaine 4% Biopsy culture obtained today. No antibiotics started. Recommended dressing changes with silver/collagen (Prisma) daily. Gauze or ABD pad to help prevent pressure. Topical lidocaine prescribed for when necessary use. Return to clinic in 1 week. If no significant improvement, we'll recommend urology consult for consideration of biopsy versus wide excision and  primary closure. Electronic Signature(s) Signed: 08/24/2015 4:02:10 PM By: Madelaine Bhat MD Entered By: Madelaine Bhat on 08/24/2015 14:01:22 Matthew Mayer ([id number]) -------------------------------------------------------------------------------- ROS/PFSH Details Patient Name: Matthew Mayer Date of Service: 08/24/2015 12:45 PM Medical Record Number: [id number] Patient Account Number: [id number] Date of Birth/Sex: 11/28/1960 (54 y.o. Male) Treating RN: Curtis Sites Primary Care Physician: Maudie Flakes Other Clinician: Referring Physician: Maudie Flakes Treating Physician/Extender: BURNS III, Regis Bill in Treatment: 0 Label Progress Note Print Version as History and Physical for this encounter Information Obtained From Patient Caregiver Wound History Do you currently have one or more open woundso Yes How many open wounds do you currently haveo 1 Approximately how long have you had your woundso 1 year How have you been treating your wound(s) until nowo open Has your wound(s) ever healed and then re-openedo No Have you had any lab work done in the past montho No Have you tested positive for an antibiotic resistant organism (MRSA, VRE)o No Have you tested positive for osteomyelitis (bone infection)o No Have you had any tests for circulation on your legso No Genitourinary Complaints and Symptoms: Positive for: Kidney failure/ Dialysis Constitutional Symptoms (General Health) Complaints and Symptoms: No Complaints or Symptoms Medical History: Past Medical History Notes: 04/30/2013 [facility] surgery, couldn't breath on his own, CCU, trach 14 days into it. Transferred to [facility] fro rehab. Home 2 weeks. Eyes Complaints and Symptoms: No Complaints or Symptoms Ear/Nose/Mouth/Throat Complaints and Symptoms: No Complaints or Symptoms Medical History: Past Medical History Notes: KOLT, MCWHIRTER ([id number]) larynx  stenosis Hematologic/Lymphatic Complaints and Symptoms: No Complaints or Symptoms Respiratory Medical History: Positive for: Chronic Obstructive Pulmonary Disease (COPD); Sleep Apnea Past Medical History Notes: tracheostomy Cardiovascular Medical History: Positive for: Congestive Heart Failure; Hypertension Gastrointestinal Complaints and Symptoms: No Complaints or Symptoms Endocrine Medical History: Positive for: Type II Diabetes - with  CKD Negative for: Type I Diabetes Time with diabetes: 20 years Treated with: Insulin, Oral agents Blood sugar tested every day: Yes Tested : 3 times daily Blood sugar testing results: Breakfast: 233 Immunological Complaints and Symptoms: No Complaints or Symptoms Integumentary (Skin) Complaints and Symptoms: No Complaints or Symptoms Musculoskeletal Complaints and Symptoms: No Complaints or Symptoms Neurologic FLYNT, BREEZE. ([id number]) Medical History: Positive for: Neuropathy Oncologic Medical History: Negative for: Received Chemotherapy; Received Radiation Psychiatric Complaints and Symptoms: No Complaints or Symptoms Hospitalization / Surgery History Name of Hospital Purpose of Hospitalization/Surgery Date [facility] 04/30/2013 Family and Social History Diabetes: Yes - Paternal Grandparents, Father, Siblings; Hypertension: Yes - Mother, Father, Siblings; Lung Disease: Yes - Siblings; Stroke: Yes - Father, Siblings, ; Former smoker; Marital Status - Married; Alcohol Use: Rarely; Drug Use: No History; Caffeine Use: Daily; Financial Concerns: Yes; Food, Clothing or Shelter Needs: No; Support System Lacking: No; Transportation Concerns: No; Advanced Directives: No; Patient does not want information on Advanced Directives; Living Will: No; Medical Power of Attorney: No Physician Affirmation I have reviewed and agree with the above information. Electronic Signature(s) Signed: 08/24/2015 4:02:10 PM By: Madelaine Bhat  MD Signed: 08/24/2015 5:54:02 PM By: Curtis Sites Entered By: Madelaine Bhat on 08/24/2015 14:00:28 Matthew Mayer ([id number]) -------------------------------------------------------------------------------- SuperBill Details Patient Name: Matthew Mayer Date of Service: 08/24/2015 Medical Record Number: [id number] Patient Account Number: [id number] Date of Birth/Sex: 1961/04/29 (54 y.o. Male) Treating RN: Curtis Sites Primary Care Physician: Maudie Flakes Other Clinician: Referring Physician: Maudie Flakes Treating Physician/Extender: BURNS III, Regis Bill in Treatment: 0 Diagnosis Coding ICD-10 Codes Code Description S31.30XA Unspecified open wound of scrotum and testes, initial encounter S30.813A Abrasion of scrotum and testes, initial encounter N50.82 Scrotal pain E11.622 Type 2 diabetes mellitus with other skin ulcer E11.22 Type 2 diabetes mellitus with diabetic chronic kidney disease J44.9 Chronic obstructive pulmonary disease, unspecified G47.30 Sleep apnea, unspecified E66.9 Obesity, unspecified I50.20 Unspecified systolic (congestive) heart failure Facility Procedures CPT4 Code: 08657846 Description: 96295 - WOUND CARE VISIT-LEV 2 EST PT Modifier: Quantity: 1 CPT4 Code: [id number] Description: 97597 - DEBRIDE WOUND 1ST 20 SQ CM OR < ICD-10 Description Diagnosis E11.622 Type 2 diabetes mellitus with other skin ulcer S30.813A Abrasion of scrotum and testes, initial encounter Modifier: Quantity: 1 Physician Procedures CPT4 Code: [id number] Description: 99204 - WC PHYS LEVEL 4 - NEW PT ICD-10 Description Diagnosis E11.622 Type 2 diabetes mellitus with other skin ulcer N50.82 Scrotal pain S30.813A Abrasion of scrotum and testes, initial encounter Modifier: Quantity: 1 CPT4 Code: [id number] GREGOREY, NABOR Description: 97597 - WC PHYS DEBR WO ANESTH 20 SQ CM Description W. ([id number]) Modifier: Quantity: 1 Electronic Signature(s) Signed: 08/24/2015  4:02:10 PM By: Madelaine Bhat MD Signed: 08/24/2015 5:54:02 PM By: Curtis Sites Entered By: Curtis Sites on 08/24/2015 14:22:05

## 2015-08-29 LAB — WOUND CULTURE: Culture: NORMAL

## 2015-08-31 ENCOUNTER — Encounter: Payer: BLUE CROSS/BLUE SHIELD | Attending: Surgery | Admitting: Surgery

## 2015-08-31 DIAGNOSIS — N5082 Scrotal pain: Secondary | ICD-10-CM | POA: Diagnosis not present

## 2015-08-31 DIAGNOSIS — S3130XD Unspecified open wound of scrotum and testes, subsequent encounter: Secondary | ICD-10-CM | POA: Diagnosis not present

## 2015-08-31 DIAGNOSIS — I5020 Unspecified systolic (congestive) heart failure: Secondary | ICD-10-CM | POA: Insufficient documentation

## 2015-08-31 DIAGNOSIS — X58XXXA Exposure to other specified factors, initial encounter: Secondary | ICD-10-CM | POA: Insufficient documentation

## 2015-08-31 DIAGNOSIS — G4730 Sleep apnea, unspecified: Secondary | ICD-10-CM | POA: Diagnosis not present

## 2015-08-31 DIAGNOSIS — N189 Chronic kidney disease, unspecified: Secondary | ICD-10-CM | POA: Insufficient documentation

## 2015-08-31 DIAGNOSIS — S30813A Abrasion of scrotum and testes, initial encounter: Secondary | ICD-10-CM | POA: Insufficient documentation

## 2015-08-31 DIAGNOSIS — J449 Chronic obstructive pulmonary disease, unspecified: Secondary | ICD-10-CM | POA: Diagnosis not present

## 2015-08-31 DIAGNOSIS — E1122 Type 2 diabetes mellitus with diabetic chronic kidney disease: Secondary | ICD-10-CM | POA: Insufficient documentation

## 2015-08-31 DIAGNOSIS — E669 Obesity, unspecified: Secondary | ICD-10-CM | POA: Insufficient documentation

## 2015-08-31 DIAGNOSIS — E11622 Type 2 diabetes mellitus with other skin ulcer: Secondary | ICD-10-CM | POA: Insufficient documentation

## 2015-09-03 NOTE — Progress Notes (Signed)
DEVAN, DANZER ([id number]) Visit Report for 08/31/2015 Chief Complaint Document Details Patient Name: [name], [name] Date of Service: 08/31/2015 1:45 PM Medical Record Number: [id number] Patient Account Number: [id number] Date of Birth/Sex: 1961-03-04 (54 y.o. Male) Treating RN: Huel Coventry Primary Care Physician: Maudie Flakes Other Clinician: Referring Physician: Maudie Flakes Treating Physician/Extender: BURNS III, Regis Bill in Treatment: 1 Information Obtained from: Patient Chief Complaint Scrotal ulcer Electronic Signature(s) Signed: 08/31/2015 2:46:32 PM By: Madelaine Bhat MD Entered By: Madelaine Bhat on 08/31/2015 14:25:36 Matthew Mayer ([id number]) -------------------------------------------------------------------------------- HPI Details Patient Name: Matthew Mayer Date of Service: 08/31/2015 1:45 PM Medical Record Number: [id number] Patient Account Number: [id number] Date of Birth/Sex: 13-Apr-1961 (54 y.o. Male) Treating RN: Huel Coventry Primary Care Physician: Maudie Flakes Other Clinician: Referring Physician: Maudie Flakes Treating Physician/Extender: BURNS III, Regis Bill in Treatment: 1 History of Present Illness Location: Patient has a ulcer to Sacrum HPI Description: Pleasant 54 year old with history of type 2 diabetes (hemoglobin A1c 11.5 in November 2016), COPD, congestive heart failure, chronic kidney disease, and obesity. Seen previously in the wound clinic for a sacral pressure ulcer, which remains healed (since January 2015). He developed an ulceration on the right side of his scrotum over a year ago which has not improved with multiple antibiotics and topical antifungal treatment. Biopsy cultured 08/25/2015 grew normal flora. Performing dressing changes with Prisma. He returns to clinic for follow-up and has no new complaints. He reports moderate pain. Mild drainage. No fever or chills. Electronic Signature(s) Signed:  08/31/2015 2:46:32 PM By: Madelaine Bhat MD Entered By: Madelaine Bhat on 08/31/2015 14:26:22 Matthew Mayer ([id number]) -------------------------------------------------------------------------------- Physical Exam Details Patient Name: Matthew Mayer Date of Service: 08/31/2015 1:45 PM Medical Record Number: [id number] Patient Account Number: [id number] Date of Birth/Sex: 27-Jan-1961 (54 y.o. Male) Treating RN: Huel Coventry Primary Care Physician: Maudie Flakes Other Clinician: Referring Physician: Maudie Flakes Treating Physician/Extender: BURNS III, Raegan Sipp Weeks in Treatment: 1 Constitutional . Pulse regular. Respirations normal and unlabored. Afebrile. . Notes Right sided scrotal ulceration unchanged. Full-thickness, though relatively superficial. No underlying abscess, sinus tract, or fistula. Consistent with pressure ulceration. No adjacent ulcer on the thigh. No cellulitis. Minimal surrounding erythema. Electronic Signature(s) Signed: 08/31/2015 2:46:32 PM By: Madelaine Bhat MD Entered By: Madelaine Bhat on 08/31/2015 14:26:49 Matthew Mayer ([id number]) -------------------------------------------------------------------------------- Physician Orders Details Patient Name: Matthew Mayer Date of Service: 08/31/2015 1:45 PM Medical Record Number: [id number] Patient Account Number: [id number] Date of Birth/Sex: 10-May-1961 (54 y.o. Male) Treating RN: Huel Coventry Primary Care Physician: Maudie Flakes Other Clinician: Referring Physician: Maudie Flakes Treating Physician/Extender: BURNS III, Regis Bill in Treatment: 1 Verbal / Phone Orders: Yes Clinician: Huel Coventry Read Back and Verified: Yes Diagnosis Coding Wound Cleansing Wound #2 Right Scrotum o Clean wound with Normal Saline. Anesthetic Wound #2 Right Scrotum o Topical Lidocaine 4% cream applied to wound bed prior to debridement Primary Wound Dressing Wound #2 Right  Scrotum o Prisma Ag Dressing Change Frequency Wound #2 Right Scrotum o Change dressing every day. - and as needed Follow-up Appointments Wound #2 Right Scrotum o Return Appointment in 1 week. 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o. 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 08/24/2015 Yes disease J44.9 Chronic obstructive pulmonary disease, unspecified 08/24/2015 Yes G47.30 Sleep apnea, unspecified 08/24/2015 Yes E66.9 Obesity, unspecified 08/24/2015 Yes I50.20 Unspecified systolic (congestive) heart failure 08/24/2015 Yes Inactive Problems Resolved Problems Matthew Mayer, Matthew Mayer ([id number]) Electronic Signature(s) Signed: 08/31/2015 2:46:32 PM By: Madelaine Bhat MD Entered By: Madelaine Bhat on 08/31/2015 14:25:26 Matthew Mayer  ([id number]) -------------------------------------------------------------------------------- Progress Note Details Patient Name: Matthew Mayer Date of Service: 08/31/2015 1:45 PM Medical Record Number: [id number] Patient Account Number: [id number] Date of Birth/Sex: 12/17/1960 (54 y.o. Male) Treating RN: Huel Coventry Primary Care Physician: Maudie Flakes Other Clinician: Referring Physician: Maudie Flakes Treating Physician/Extender: BURNS III, Regis Bill in Treatment: 1 Subjective Chief Complaint Information obtained from Patient Scrotal ulcer History of Present Illness (HPI) The following HPI elements were documented for the patient's wound: Location: Patient has a ulcer to Sacrum Pleasant 54 year old with history of type 2 diabetes (hemoglobin A1c 11.5 in November 2016), COPD, congestive heart failure, chronic kidney disease, and obesity. Seen previously in the wound clinic for a sacral pressure ulcer, which remains healed (since January 2015). He developed an ulceration on the right side of his scrotum over a year ago which has not improved with multiple antibiotics and topical antifungal treatment. Biopsy cultured 08/25/2015 grew normal flora. Performing dressing changes with Prisma. He returns to clinic for follow-up and has no new complaints. He reports moderate pain. Mild drainage. No fever or chills. Objective Constitutional Pulse regular. Respirations normal and unlabored. Afebrile. Vitals Time Taken: 1:56 PM, Height: 67 in, Weight: 315 lbs, BMI: 49.3, Temperature: 98.9 F, Pulse: 79 bpm, Respiratory Rate: 20 breaths/min, Blood Pressure: 137/60 mmHg. General Notes: Right sided scrotal ulceration unchanged. Full-thickness, though relatively superficial. No underlying abscess, sinus tract, or fistula. Consistent with pressure ulceration. No adjacent ulcer on the thigh. No cellulitis. Minimal surrounding erythema. Integumentary (Hair, Skin) MIRZA, FESSEL.  ([id number]) Wound #2 status is Open. Original cause of wound was Not Known. The wound is located on the Right Scrotum. The wound measures 1.2cm length x 1.3cm width x 0.1cm depth; 1.225cm^2 area and 0.123cm^3 volume. The wound is limited to skin breakdown. There is no tunneling or undermining noted. There is a small amount of serous drainage noted. The wound margin is flat and intact. There is large (67-100%) red granulation within the wound bed. There is a small (1-33%) amount of necrotic tissue within the wound bed including Adherent Slough. The periwound skin appearance did not exhibit: Callus, Crepitus, Excoriation, Fluctuance, Friable, Induration, Localized Edema, Rash, Scarring, Dry/Scaly, Maceration, Moist, Atrophie Blanche, Cyanosis, Ecchymosis, Hemosiderin Staining, Mottled, Pallor, Rubor, Erythema. The periwound has tenderness on palpation. Assessment Active Problems ICD-10 S31.30XD - Unspecified open wound of scrotum and testes, subsequent encounter S30.813A - Abrasion of scrotum and testes, initial encounter N50.82 - Scrotal pain E11.622 - Type 2 diabetes mellitus with other skin ulcer E11.22 - Type 2 diabetes mellitus with diabetic chronic kidney disease J44.9 - Chronic  obstructive pulmonary disease, unspecified G47.30 - Sleep apnea, unspecified E66.9 - Obesity, unspecified I50.20 - Unspecified systolic (congestive) heart failure Chronic scrotal ulceration. Plan Wound Cleansing: Wound #2 Right Scrotum: Clean wound with Normal Saline. Anesthetic: Wound #2 Right Scrotum: Topical Lidocaine 4% cream applied to wound bed prior to debridement Primary Wound Dressing: Wound #2 Right Scrotum: Prisma Ag Dressing Change Frequency: Wound #2 Right Scrotum: Change dressing every day. - and as needed Matthew Mayer, Matthew W. ([id number]) Follow-up Appointments: Wound #2 Right Scrotum: Return Appointment in 1 week. ordered were: urology consult - [place] [facility] Continue with Promogran Prisma dressing changes. On Levaquin for sinus infection. Urology consult for consideration of biopsy to rule out malignancy as well as possible wide excision and primary closure. Electronic Signature(s) Signed: 08/31/2015 2:46:32 PM By: Madelaine BhatBurns, III, Niasia Lanphear MD Entered By: Madelaine BhatBurns, III, Kalandra Masters on 08/31/2015 14:27:28 Matthew Mayer, Matthew W. ([id number]) -------------------------------------------------------------------------------- SuperBill Details Patient Name: Matthew Mayer, Matthew W. Date of Service: 08/31/2015 Medical Record Number: [id number] Patient Account Number: [id number] Date of Birth/Sex: 08-Sep-1961 (54 y.o. Male) Treating RN: Huel CoventryWoody, Kim Primary Care Physician: Maudie FlakesFeldpausch, Dale Other Clinician: Referring Physician: Maudie FlakesFeldpausch, Dale Treating Physician/Extender: BURNS III, Regis BillWALTER Weeks in Treatment: 1 Diagnosis Coding ICD-10 Codes Code Description S31.30XD Unspecified open wound of scrotum and testes, subsequent encounter S30.813A Abrasion of scrotum and testes, initial encounter N50.82 Scrotal pain E11.622 Type 2 diabetes mellitus with other skin ulcer E11.22 Type 2 diabetes mellitus with diabetic chronic kidney disease J44.9 Chronic obstructive pulmonary disease, unspecified G47.30 Sleep apnea, unspecified E66.9 Obesity, unspecified I50.20 Unspecified systolic (congestive) heart failure Facility Procedures CPT4 Code: [id number] Description: [id number] - WOUND CARE VISIT-LEV 3 EST PT Modifier: Quantity: 1 Physician Procedures CPT4 Code Description: [id number] - WC PHYS LEVEL 2 - EST PT ICD-10 Description Diagnosis S31.30XD Unspecified open wound of scrotum and testes, subs E11.622 Type 2 diabetes mellitus with other skin ulcer Modifier: equent encou Quantity: 1 nter Electronic Signature(s) Signed: 08/31/2015 2:46:32 PM By: Madelaine BhatBurns, III, Joanmarie Tsang MD Entered By: Madelaine BhatBurns, III, Totiana Everson on 08/31/2015 14:27:53

## 2015-09-03 NOTE — Progress Notes (Signed)
Matthew Mayer, Arvon W. ([id number]) Visit Report for 08/31/2015 Arrival Information Details Patient Name: Matthew Mayer, Matthew W. Date of Service: 08/31/2015 1:45 PM Medical Record Number: [id number] Patient Account Number: [id number] Date of Birth/Sex: Sep 20, 1961 (54 y.o. Male) Treating RN: Huel CoventryWoody, Kim Primary Care Physician: Maudie FlakesFeldpausch, Dale Other Clinician: Referring Physician: Maudie FlakesFeldpausch, Dale Treating Physician/Extender: BURNS III, Regis BillWALTER Weeks in Treatment: 1 Visit Information History Since Last Visit Added or deleted any medications: No Patient Arrived: Ambulatory Any new allergies or adverse reactions: No Arrival Time: 13:54 Had a fall or experienced change in No Accompanied By: wife activities of daily living that may affect Transfer Assistance: None risk of falls: Patient Identification Verified: Yes Signs or symptoms of abuse/neglect since last No Secondary Verification Process Yes visito Completed: Hospitalized since last visit: No Patient Has Alerts: Yes Has Dressing in Place as Prescribed: Yes Patient Alerts: DMII Pain Present Now: No Electronic Signature(s) Signed: 09/02/2015 3:49:12 PM By: Elliot GurneyWoody, RN, BSN, Kim RN, BSN Entered By: Elliot GurneyWoody, RN, BSN, Kim on 08/31/2015 13:55:13 Matthew Mayer, Matthew W. ([id number]) -------------------------------------------------------------------------------- Clinic Level of Care Assessment Details Patient Name: Matthew Mayer, Matthew W. Date of Service: 08/31/2015 1:45 PM Medical Record Number: [id number] Patient Account Number: [id number] Date of Birth/Sex: Sep 20, 1961 (54 y.o. Male) Treating RN: Huel CoventryWoody, Kim Primary Care Physician: Maudie FlakesFeldpausch, Dale Other Clinician: Referring Physician: Maudie FlakesFeldpausch, Dale Treating Physician/Extender: BURNS III, Regis BillWALTER Weeks in Treatment: 1 Clinic Level of Care Assessment Items TOOL 4 Quantity Score []  - Use when only an EandM is performed on FOLLOW-UP visit 0 ASSESSMENTS - Nursing Assessment / Reassessment []  -  Reassessment of Co-morbidities (includes updates in patient status) 0 X - Reassessment of Adherence to Treatment Plan 1 5 ASSESSMENTS - Wound and Skin Assessment / Reassessment X - Simple Wound Assessment / Reassessment - one wound 1 5 []  - Complex Wound Assessment / Reassessment - multiple wounds 0 []  - Dermatologic / Skin Assessment (not related to wound area) 0 ASSESSMENTS - Focused Assessment []  - Circumferential Edema Measurements - multi extremities 0 []  - Nutritional Assessment / Counseling / Intervention 0 []  - Lower Extremity Assessment (monofilament, tuning fork, pulses) 0 []  - Peripheral Arterial Disease Assessment (using hand held doppler) 0 ASSESSMENTS - Ostomy and/or Continence Assessment and Care []  - Incontinence Assessment and Management 0 []  - Ostomy Care Assessment and Management (repouching, etc.) 0 PROCESS - Coordination of Care X - Simple Patient / Family Education for ongoing care 1 15 []  - Complex (extensive) Patient / Family Education for ongoing care 0 X - Staff obtains ChiropractorConsents, Records, Test Results / Process Orders 1 10 []  - Staff telephones HHA, Nursing Homes / Clarify orders / etc 0 []  - Routine Transfer to another Facility (non-emergent condition) 0 Matthew Mayer, Matthew W. ([id number]) []  - Routine Hospital Admission (non-emergent condition) 0 X - New Admissions / Manufacturing engineernsurance Authorizations / Ordering NPWT, Apligraf, etc. 1 15 []  - Emergency Hospital Admission (emergent condition) 0 X - Simple Discharge Coordination 1 10 []  - Complex (extensive) Discharge Coordination 0 PROCESS - Special Needs []  - Pediatric / Minor Patient Management 0 []  - Isolation Patient Management 0 []  - Hearing / Language / Visual special needs 0 []  - Assessment of Community assistance (transportation, D/C planning, etc.) 0 []  - Additional assistance / Altered mentation 0 []  - Support Surface(s) Assessment (bed, cushion, seat, etc.) 0 INTERVENTIONS - Wound Cleansing / Measurement X -  Simple Wound Cleansing - one wound 1 5 []  - Complex Wound Cleansing - multiple wounds 0 X - Wound Imaging (photographs - any number  of wounds) 1 5  - Wound Tracing (instead of photographs) 0 X - Simple Wound Measurement - one wound 1 5  - Complex Wound Measurement - multiple wounds 0 INTERVENTIONS - Wound Dressings X - Small Wound Dressing one or multiple wounds 1 10  - Medium Wound Dressing one or multiple wounds 0  - Large Wound Dressing one or multiple wounds 0  - Application of Medications - topical 0  - Application of Medications - injection 0 INTERVENTIONS - Miscellaneous  - External ear exam 0 Matthew Mayer ([id number])  - Specimen Collection (cultures, biopsies, blood, body fluids, etc.) 0  - Specimen(s) / Culture(s) sent or taken to Lab for analysis 0  - Patient Transfer (multiple staff / Michiel Sites Lift / Similar devices) 0  - Simple Staple / Suture removal (25 or less) 0  - Complex Staple / Suture removal (26 or more) 0  - Hypo / Hyperglycemic Management (close monitor of Blood Glucose) 0  - Ankle / Brachial Index (ABI) - do not check if billed separately 0 X - Vital Signs 1 5 Has the patient been seen at the hospital within the last three years: Yes Total Score: 90 Level Of Care: New/Established - Level 3 Electronic Signature(s) Signed: 09/02/2015 3:49:12 PM By: Elliot Gurney, RN, BSN, Kim RN, BSN Entered By: Elliot Gurney, RN, BSN, Kim on 08/31/2015 14:12:34 Matthew Mayer ([id number]) -------------------------------------------------------------------------------- Encounter Discharge Information Details Patient Name: Matthew Mayer Date of Service: 08/31/2015 1:45 PM Medical Record Number: [id number] Patient Account Number: [id number] Date of Birth/Sex: 1961/03/30 (54 y.o. Male) Treating RN: Huel Coventry Primary Care Physician: Maudie Flakes Other Clinician: Referring Physician: Maudie Flakes Treating Physician/Extender: BURNS III, Regis Bill  in Treatment: 1 Encounter Discharge Information Items Discharge Pain Level: 0 Discharge Condition: Stable Ambulatory Status: Ambulatory Discharge Destination: Home Transportation: Private Auto Accompanied By: wife Schedule Follow-up Appointment: Yes Medication Reconciliation completed and provided to Patient/Care Yes Khoury Siemon: Provided on Clinical Summary of Care: 08/31/2015 Form Type Recipient Paper Patient DT Electronic Signature(s) Signed: 08/31/2015 2:18:10 PM By: Gwenlyn Perking Entered By: Gwenlyn Perking on 08/31/2015 14:18:10 Matthew Mayer ([id number]) -------------------------------------------------------------------------------- Multi Wound Chart Details Patient Name: Matthew Mayer Date of Service: 08/31/2015 1:45 PM Medical Record Number: [id number] Patient Account Number: [id number] Date of Birth/Sex: November 01, 1960 (54 y.o. Male) Treating RN: Huel Coventry Primary Care Physician: Maudie Flakes Other Clinician: Referring Physician: Maudie Flakes Treating Physician/Extender: BURNS III, WALTER Weeks in Treatment: 1 Vital Signs Height(in): 67 Pulse(bpm): 79 Weight(lbs): 315 Blood Pressure 137/60 (mmHg): Body Mass Index(BMI): 49 Temperature(F): 98.9 Respiratory Rate 20 (breaths/min): Photos: [2:No Photos] [N/A:N/A] Wound Location: [2:Right Scrotum] [N/A:N/A] Wounding Event: [2:Not Known] [N/A:N/A] Primary Etiology: [2:Pressure Ulcer] [N/A:N/A] Comorbid History: [2:Chronic Obstructive Pulmonary Disease (COPD), Sleep Apnea, Congestive Heart Failure, Hypertension, Type II Diabetes, Neuropathy] [N/A:N/A] Date Acquired: [2:08/30/2014] [N/A:N/A] Weeks of Treatment: [2:1] [N/A:N/A] Wound Status: [2:Open] [N/A:N/A] Measurements L x W x D 1.2x1.3x0.1 [N/A:N/A] (cm) Area (cm) : [2:1.225] [N/A:N/A] Volume (cm) : [2:0.123] [N/A:N/A] % Reduction in Area: [2:-20.00%] [N/A:N/A] % Reduction in Volume: -20.60% [N/A:N/A] Classification: [2:Category/Stage III]  [N/A:N/A] Exudate Amount: [2:Small] [N/A:N/A] Exudate Type: [2:Serous] [N/A:N/A] Exudate Color: [2:amber] [N/A:N/A] Wound Margin: [2:Flat and Intact] [N/A:N/A] Granulation Amount: [2:Large (67-100%)] [N/A:N/A] Granulation Quality: [2:Red] [N/A:N/A] Necrotic Amount: [2:Small (1-33%)] [N/A:N/A] Exposed Structures: [2:Fascia: No Fat: No Tendon: No] [N/A:N/A] Muscle: No Joint: No Bone: No Limited to Skin Breakdown Epithelialization: None N/A N/A Periwound Skin Texture: Edema: No N/A N/A Excoriation: No Induration: No Callus: No Crepitus: No Fluctuance: No Friable: No Rash:  No Scarring: No Periwound Skin Maceration: No N/A N/A Moisture: Moist: No Dry/Scaly: No Periwound Skin Color: Atrophie Blanche: No N/A N/A Cyanosis: No Ecchymosis: No Erythema: No Hemosiderin Staining: No Mottled: No Pallor: No Rubor: No Tenderness on Yes N/A N/A Palpation: Wound Preparation: Ulcer Cleansing: N/A N/A Rinsed/Irrigated with Saline Topical Anesthetic Applied: Other: lidocaine 4% Treatment Notes Electronic Signature(s) Signed: 09/02/2015 3:49:12 PM By: Elliot Gurney, RN, BSN, Kim RN, BSN Entered By: Elliot Gurney, RN, BSN, Kim on 08/31/2015 14:10:24 Matthew Mayer ([id number]) -------------------------------------------------------------------------------- Multi-Disciplinary Care Plan Details Patient Name: Matthew Mayer Date of Service: 08/31/2015 1:45 PM Medical Record Number: [id number] Patient Account Number: [id number] Date of Birth/Sex: 10-31-1960 (54 y.o. Male) Treating RN: Huel Coventry Primary Care Physician: Maudie Flakes Other Clinician: Referring Physician: Maudie Flakes Treating Physician/Extender: BURNS III, Regis Bill in Treatment: 1 Active Inactive Wound/Skin Impairment Nursing Diagnoses: Impaired tissue integrity Knowledge deficit related to ulceration/compromised skin integrity Goals: Patient/caregiver will verbalize understanding of skin care regimen Date  Initiated: 08/24/2015 Goal Status: Active Ulcer/skin breakdown will have a volume reduction of 30% by week 4 Date Initiated: 08/24/2015 Goal Status: Active Ulcer/skin breakdown will have a volume reduction of 50% by week 8 Date Initiated: 08/24/2015 Goal Status: Active Ulcer/skin breakdown will have a volume reduction of 80% by week 12 Date Initiated: 08/24/2015 Goal Status: Active Ulcer/skin breakdown will heal within 14 weeks Date Initiated: 08/24/2015 Goal Status: Active Interventions: Assess patient/caregiver ability to perform ulcer/skin care regimen upon admission and as needed Assess ulceration(s) every visit Notes: Electronic Signature(s) Signed: 09/02/2015 3:49:12 PM By: Elliot Gurney, RN, BSN, Kim RN, BSN Entered By: Elliot Gurney, RN, BSN, Kim on 08/31/2015 14:10:17 Matthew Mayer ([id number]) -------------------------------------------------------------------------------- Pain Assessment Details Patient Name: Matthew Mayer Date of Service: 08/31/2015 1:45 PM Medical Record Number: [id number] Patient Account Number: [id number] Date of Birth/Sex: 1961/05/16 (54 y.o. Male) Treating RN: Huel Coventry Primary Care Physician: Maudie Flakes Other Clinician: Referring Physician: Maudie Flakes Treating Physician/Extender: BURNS III, Regis Bill in Treatment: 1 Active Problems Location of Pain Severity and Description of Pain Patient Has Paino Yes Site Locations Pain Location: Pain in Ulcers Duration of the Pain. Constant / Intermittento Intermittent Character of Pain Describe the Pain: Burning Pain Management and Medication Current Pain Management: Electronic Signature(s) Signed: 09/02/2015 3:49:12 PM By: Elliot Gurney, RN, BSN, Kim RN, BSN Entered By: Elliot Gurney, RN, BSN, Kim on 08/31/2015 13:55:34 Matthew Mayer ([id number]) -------------------------------------------------------------------------------- Patient/Caregiver Education Details Patient Name: Matthew Mayer Date  of Service: 08/31/2015 1:45 PM Medical Record Number: [id number] Patient Account Number: [id number] Date of Birth/Gender: 1961-06-04 (54 y.o. Male) Treating RN: Huel Coventry Primary Care Physician: Maudie Flakes Other Clinician: Referring Physician: Maudie Flakes Treating Physician/Extender: BURNS III, Regis Bill in Treatment: 1 Education Assessment Education Provided To: Patient Education Topics Provided Wound/Skin Impairment: Handouts: Caring for Your Ulcer, Other: continue wound care as prescribed Methods: Demonstration, Explain/Verbal Responses: State content correctly Electronic Signature(s) Signed: 09/02/2015 3:49:12 PM By: Elliot Gurney, RN, BSN, Kim RN, BSN Entered By: Elliot Gurney, RN, BSN, Kim on 08/31/2015 14:14:04 Matthew Mayer ([id number]) -------------------------------------------------------------------------------- Wound Assessment Details Patient Name: Matthew Mayer Date of Service: 08/31/2015 1:45 PM Medical Record Number: [id number] Patient Account Number: [id number] Date of Birth/Sex: 24-Apr-1961 (54 y.o. Male) Treating RN: Huel Coventry Primary Care Physician: Maudie Flakes Other Clinician: Referring Physician: Maudie Flakes Treating Physician/Extender: BURNS III, Regis Bill in Treatment: 1 Wound Status Wound Number: 2 Primary Pressure Ulcer Etiology: Wound Location: Right Scrotum Wound Open Wounding Event: Not Known Status: Date Acquired: 08/30/2014 Comorbid Chronic Obstructive Pulmonary Disease  Weeks Of Treatment: 1 History: (COPD), Sleep Apnea, Congestive Clustered Wound: No Heart Failure, Hypertension, Type II Diabetes, Neuropathy Photos Photo Uploaded By: Elliot Gurney, RN, BSN, Kim on 08/31/2015 16:38:08 Wound Measurements Length: (cm) 1.2 Width: (cm) 1.3 Depth: (cm) 0.1 Area: (cm) 1.225 Volume: (cm) 0.123 % Reduction in Area: -20% % Reduction in Volume: -20.6% Epithelialization: None Tunneling: No Undermining: No Wound  Description Classification: Category/Stage III Wound Margin: Flat and Intact Exudate Amount: Small Exudate Type: Serous Exudate Color: amber Foul Odor After Cleansing: No Wound Bed Granulation Amount: Large (67-100%) Exposed Structure Granulation Quality: Red Fascia Exposed: No Necrotic Amount: Small (1-33%) Fat Layer Exposed: No JEMARCUS, DOUGAL ([id number]) Necrotic Quality: Adherent Slough Tendon Exposed: No Muscle Exposed: No Joint Exposed: No Bone Exposed: No Limited to Skin Breakdown Periwound Skin Texture Texture Color No Abnormalities Noted: No No Abnormalities Noted: No Callus: No Atrophie Blanche: No Crepitus: No Cyanosis: No Excoriation: No Ecchymosis: No Fluctuance: No Erythema: No Friable: No Hemosiderin Staining: No Induration: No Mottled: No Localized Edema: No Pallor: No Rash: No Rubor: No Scarring: No Temperature / Pain Moisture Tenderness on Palpation: Yes No Abnormalities Noted: No Dry / Scaly: No Maceration: No Moist: No Wound Preparation Ulcer Cleansing: Rinsed/Irrigated with Saline Topical Anesthetic Applied: Other: lidocaine 4%, Treatment Notes Wound #2 (Right Scrotum) 1. Cleansed with: Clean wound with Normal Saline 2. Anesthetic Topical Lidocaine 4% cream to wound bed prior to debridement 3. Peri-wound Care: Skin Prep 4. Dressing Applied: Prisma Ag 7. Secured with Self adhesive bandage Electronic Signature(s) Signed: 09/02/2015 3:49:12 PM By: Elliot Gurney, RN, BSN, Kim RN, BSN Entered By: Elliot Gurney, RN, BSN, Kim on 08/31/2015 14:02:24 Matthew Mayer ([id number]) -------------------------------------------------------------------------------- Vitals Details Patient Name: Matthew Mayer Date of Service: 08/31/2015 1:45 PM Medical Record Number: [id number] Patient Account Number: [id number] Date of Birth/Sex: 24-Aug-1961 (54 y.o. Male) Treating RN: Huel Coventry Primary Care Physician: Maudie Flakes Other Clinician: Referring  Physician: Maudie Flakes Treating Physician/Extender: BURNS III, Regis Bill in Treatment: 1 Vital Signs Time Taken: 13:56 Temperature (F): 98.9 Height (in): 67 Pulse (bpm): 79 Weight (lbs): 315 Respiratory Rate (breaths/min): 20 Body Mass Index (BMI): 49.3 Blood Pressure (mmHg): 137/60 Reference Range: 80 - 120 mg / dl Electronic Signature(s) Signed: 09/02/2015 3:49:12 PM By: Elliot Gurney, RN, BSN, Kim RN, BSN Entered By: Elliot Gurney, RN, BSN, Kim on 08/31/2015 13:56:06

## 2015-09-14 ENCOUNTER — Encounter: Payer: BLUE CROSS/BLUE SHIELD | Admitting: Surgery

## 2015-09-14 DIAGNOSIS — E11622 Type 2 diabetes mellitus with other skin ulcer: Secondary | ICD-10-CM | POA: Diagnosis not present

## 2015-09-15 NOTE — Progress Notes (Signed)
HENDERSON, FRAMPTON ([id number]) Visit Report for 09/14/2015 Arrival Information Details Patient Name: Matthew Mayer, Matthew Mayer Date of Service: 09/14/2015 3:00 PM Medical Record Number: [id number] Patient Account Number: [id number] Date of Birth/Sex: 11/26/1960 (54 y.o. Male) Treating RN: Phillis Haggis Primary Care Physician: Maudie Flakes Other Clinician: Referring Physician: Maudie Flakes Treating Physician/Extender: BURNS III, Regis Bill in Treatment: 3 Visit Information History Since Last Visit All ordered tests and consults were completed: No Patient Arrived: Ambulatory Added or deleted any medications: No Arrival Time: 15:18 Any new allergies or adverse reactions: No Accompanied By: wife Had a fall or experienced change in No Transfer Assistance: None activities of daily living that may affect Patient Identification Verified: Yes risk of falls: Secondary Verification Process Yes Signs or symptoms of abuse/neglect since last No Completed: visito Patient Requires Transmission-Based No Hospitalized since last visit: No Precautions: Pain Present Now: Yes Patient Has Alerts: Yes Patient Alerts: DMII Electronic Signature(s) Signed: 09/14/2015 4:19:08 PM By: Alejandro Mulling Entered By: Alejandro Mulling on 09/14/2015 15:18:31 Matthew Mayer ([id number]) -------------------------------------------------------------------------------- Clinic Level of Care Assessment Details Patient Name: Matthew Mayer Date of Service: 09/14/2015 3:00 PM Medical Record Number: [id number] Patient Account Number: [id number] Date of Birth/Sex: 09/11/1961 (54 y.o. Male) Treating RN: Phillis Haggis Primary Care Physician: Maudie Flakes Other Clinician: Referring Physician: Maudie Flakes Treating Physician/Extender: BURNS III, Regis Bill in Treatment: 3 Clinic Level of Care Assessment Items TOOL 4 Quantity Score  - Use when only an EandM is performed on FOLLOW-UP visit  0 ASSESSMENTS - Nursing Assessment / Reassessment  - Reassessment of Co-morbidities (includes updates in patient status) 0 X - Reassessment of Adherence to Treatment Plan 1 5 ASSESSMENTS - Wound and Skin Assessment / Reassessment  - Simple Wound Assessment / Reassessment - one wound 0 X - Complex Wound Assessment / Reassessment - multiple wounds 1 5  - Dermatologic / Skin Assessment (not related to wound area) 0 ASSESSMENTS - Focused Assessment  - Circumferential Edema Measurements - multi extremities 0  - Nutritional Assessment / Counseling / Intervention 0  - Lower Extremity Assessment (monofilament, tuning fork, pulses) 0  - Peripheral Arterial Disease Assessment (using hand held doppler) 0 ASSESSMENTS - Ostomy and/or Continence Assessment and Care  - Incontinence Assessment and Management 0  - Ostomy Care Assessment and Management (repouching, etc.) 0 PROCESS - Coordination of Care X - Simple Patient / Family Education for ongoing care 1 15  - Complex (extensive) Patient / Family Education for ongoing care 0  - Staff obtains Chiropractor, Records, Test Results / Process Orders 0  - Staff telephones HHA, Nursing Homes / Clarify orders / etc 0  - Routine Transfer to another Facility (non-emergent condition) 0 NIRVAN, LABAN. ([id number])  - Routine Hospital Admission (non-emergent condition) 0  - New Admissions / Manufacturing engineer / Ordering NPWT, Apligraf, etc. 0  - Emergency Hospital Admission (emergent condition) 0 X - Simple Discharge Coordination 1 10  - Complex (extensive) Discharge Coordination 0 PROCESS - Special Needs  - Pediatric / Minor Patient Management 0  - Isolation Patient Management 0  - Hearing / Language / Visual special needs 0  - Assessment of Community assistance (transportation, D/C planning, etc.) 0  - Additional assistance / Altered mentation 0  - Support Surface(s) Assessment (bed, cushion, seat, etc.)  0 INTERVENTIONS - Wound Cleansing / Measurement  - Simple Wound Cleansing - one wound 0 X - Complex Wound Cleansing - multiple wounds 1 5 X - Wound Imaging (photographs - any number of wounds)  1 5 []  - Wound Tracing (instead of photographs) 0 []  - Simple Wound Measurement - one wound 0 X - Complex Wound Measurement - multiple wounds 1 5 INTERVENTIONS - Wound Dressings X - Small Wound Dressing one or multiple wounds 1 10 []  - Medium Wound Dressing one or multiple wounds 0 []  - Large Wound Dressing one or multiple wounds 0 []  - Application of Medications - topical 0 []  - Application of Medications - injection 0 INTERVENTIONS - Miscellaneous []  - External ear exam 0 Matthew Mayer, Oval W. ([id number]) []  - Specimen Collection (cultures, biopsies, blood, body fluids, etc.) 0 []  - Specimen(s) / Culture(s) sent or taken to Lab for analysis 0 []  - Patient Transfer (multiple staff / Michiel SitesHoyer Lift / Similar devices) 0 []  - Simple Staple / Suture removal (25 or less) 0 []  - Complex Staple / Suture removal (26 or more) 0 []  - Hypo / Hyperglycemic Management (close monitor of Blood Glucose) 0 []  - Ankle / Brachial Index (ABI) - do not check if billed separately 0 X - Vital Signs 1 5 Has the patient been seen at the hospital within the last three years: Yes Total Score: 65 Level Of Care: New/Established - Level 2 Electronic Signature(s) Signed: 09/14/2015 4:19:08 PM By: Alejandro MullingPinkerton, Debra Entered By: Alejandro MullingPinkerton, Debra on 09/14/2015 15:40:34 Matthew Mayer, Evangelos W. ([id number]) -------------------------------------------------------------------------------- Encounter Discharge Information Details Patient Name: Matthew Mayer, Matthew W. Date of Service: 09/14/2015 3:00 PM Medical Record Number: [id number] Patient Account Number: [id number] Date of Birth/Sex: Aug 27, 1961 (54 y.o. Male) Treating RN: Phillis HaggisPinkerton, Debi Primary Care Physician: Maudie FlakesFeldpausch, Dale Other Clinician: Referring Physician: Maudie FlakesFeldpausch,  Dale Treating Physician/Extender: BURNS III, Regis BillWALTER Weeks in Treatment: 3 Encounter Discharge Information Items Discharge Pain Level: 0 Discharge Condition: Stable Ambulatory Status: Ambulatory Discharge Destination: Home Transportation: Private Auto Accompanied By: wife Schedule Follow-up Appointment: Yes Medication Reconciliation completed and provided to Patient/Care Yes Floriene Jeschke: Provided on Clinical Summary of Care: 09/14/2015 Form Type Recipient Paper Patient DT Electronic Signature(s) Signed: 09/14/2015 3:53:55 PM By: Gwenlyn PerkingMoore, Shelia Entered By: Gwenlyn PerkingMoore, Shelia on 09/14/2015 15:53:55 Matthew Mayer, Matthew W. ([id number]) -------------------------------------------------------------------------------- Lower Extremity Assessment Details Patient Name: Matthew Mayer, Matthew W. Date of Service: 09/14/2015 3:00 PM Medical Record Number: [id number] Patient Account Number: [id number] Date of Birth/Sex: Aug 27, 1961 (54 y.o. Male) Treating RN: Phillis HaggisPinkerton, Debi Primary Care Physician: Maudie FlakesFeldpausch, Dale Other Clinician: Referring Physician: Maudie FlakesFeldpausch, Dale Treating Physician/Extender: BURNS III, Regis BillWALTER Weeks in Treatment: 3 Electronic Signature(s) Signed: 09/14/2015 4:19:08 PM By: Alejandro MullingPinkerton, Debra Entered By: Alejandro MullingPinkerton, Debra on 09/14/2015 15:23:16 Matthew Mayer, Hutch W. ([id number]) -------------------------------------------------------------------------------- Multi Wound Chart Details Patient Name: Matthew Mayer, Matthew W. Date of Service: 09/14/2015 3:00 PM Medical Record Number: [id number] Patient Account Number: [id number] Date of Birth/Sex: Aug 27, 1961 58(54 y.o. 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:Pressure Ulcer] [3:To be determined] [N/A:N/A] Comorbid History: [2:Chronic Obstructive Pulmonary Disease (COPD), Sleep Apnea, Congestive Heart Failure, Hypertension, Type II Diabetes, Neuropathy] [3:Chronic Obstructive Pulmonary Disease (COPD), Sleep Apnea, Congestive Heart Failure, Hypertension,  Type II Diabetes, Neuropathy] [N/A:N/A] Date Acquired: [2:08/30/2014] [3:09/12/2015] [N/A:N/A] Weeks of Treatment: [2:3] [3:0] [N/A:N/A] Wound Status: [2:Open] [3:Open] [N/A:N/A] Measurements L x W x D 0.5x0.3x0.1 [3:1x0.2x0.1] [N/A:N/A] (cm) Area (cm) : [  2:0.118] [3:0.157] [N/A:N/A] Volume (cm) : [2:0.012] [3:0.016] [N/A:N/A] % Reduction in Area: [2:88.40%] [3:N/A] [N/A:N/A] % Reduction in Volume: 88.20% [3:N/A] [N/A:N/A] Classification: [2:Category/Stage III] [3:Partial Thickness] [N/A:N/A] Exudate Amount: [2:Small] [3:None Present] [N/A:N/A] Exudate Type: [2:Serous] [3:N/A] [N/A:N/A] Exudate Color: [2:amber] [3:N/A] [N/A:N/A] Wound Margin: [2:Flat and Intact] [3:Flat and Intact] [N/A:N/A] Granulation Amount: [2:None Present (0%)] [3:Large (67-100%)] [N/A:N/A] Granulation Quality: [2:N/A] [3:Pink] [N/A:N/A] Necrotic Amount: [2:Large (67-100%)] [3:None Present (0%)] [N/A:N/A] Exposed Structures: [2:Fascia: No Fat: No Tendon: No] [3:Fascia: No Fat: No Tendon: No] [N/A:N/A] Muscle: No Muscle: No Joint: No Joint: No Bone: No Bone: No Limited to Skin Limited to Skin Breakdown Breakdown Epithelialization: None None N/A Periwound Skin Texture: Edema: Yes No Abnormalities Noted N/A Excoriation: No Induration: No Callus: No Crepitus: No Fluctuance: No Friable: No Rash: No Scarring: No Periwound Skin Moist: Yes No Abnormalities Noted  N/A Moisture: Maceration: No Dry/Scaly: No Periwound Skin Color: Atrophie Blanche: No No Abnormalities Noted N/A Cyanosis: No Ecchymosis: No Erythema: No Hemosiderin Staining: No Mottled: No Pallor: No Rubor: No Temperature: N/A No Abnormality N/A Tenderness on Yes No N/A Palpation: Wound Preparation: Ulcer Cleansing: Ulcer Cleansing: N/A Rinsed/Irrigated with Rinsed/Irrigated with Saline Saline Topical Anesthetic Topical Anesthetic Applied: Other: lidocaine Applied: Other: lidocaine 4% 4% Treatment Notes Electronic Signature(s) Signed: 09/14/2015 4:19:08 PM By: Alejandro Mulling Entered By: Alejandro Mulling on 09/14/2015 15:36:28 Matthew Mayer ([id number]) -------------------------------------------------------------------------------- Multi-Disciplinary Care Plan Details Patient Name: Matthew Mayer Date of Service: 09/14/2015 3:00 PM Medical Record Number: [id number] Patient Account Number: [id number] Date of Birth/Sex: 04-27-1961 (54 y.o. Male) Treating RN: Phillis Haggis Primary Care Physician: Maudie Flakes Other Clinician: Referring Physician: Maudie Flakes Treating Physician/Extender: BURNS III, Regis Bill in Treatment: 3 Active Inactive Wound/Skin Impairment Nursing Diagnoses: Impaired tissue integrity Knowledge deficit related to ulceration/compromised skin integrity Goals: Patient/caregiver will verbalize understanding of skin care regimen Date Initiated: 08/24/2015 Goal Status: Active Ulcer/skin breakdown will have a volume reduction of 30% by week 4 Date Initiated: 08/24/2015 Goal Status: Active Ulcer/skin breakdown will have a volume reduction of 50% by week 8 Date Initiated: 08/24/2015 Goal Status: Active Ulcer/skin breakdown will have a volume reduction of 80% by week 12 Date Initiated: 08/24/2015 Goal Status: Active Ulcer/skin breakdown will heal within 14 weeks Date Initiated: 08/24/2015 Goal Status:  Active Interventions: Assess patient/caregiver ability to perform ulcer/skin care regimen upon admission and as needed Assess ulceration(s) every visit Notes: Electronic Signature(s) Signed: 09/14/2015 4:19:08 PM By: Alejandro Mulling Entered By: Alejandro Mulling on 09/14/2015 15:36:19 Matthew Mayer ([id number]) -------------------------------------------------------------------------------- Pain Assessment Details Patient Name: Matthew Mayer Date of Service: 09/14/2015 3:00 PM Medical Record Number: [id number] Patient Account Number: [id number] Date of Birth/Sex: Feb 07, 1961 (54 y.o. Male) Treating RN: Phillis Haggis Primary Care Physician: Maudie Flakes Other Clinician: Referring Physician: Maudie Flakes Treating Physician/Extender: BURNS III, Regis Bill in Treatment: 3 Active Problems Location of Pain Severity and Description of Pain Patient Has Paino Yes Site Locations Pain Location: Pain in Ulcers With Dressing Change: Yes Duration of the Pain. Constant / Intermittento Constant Rate the pain. Current Pain Level: 6 Character of Pain Describe the Pain: Sharp Pain Management and Medication Current Pain Management: Electronic Signature(s) Signed: 09/14/2015 4:19:08 PM By: Alejandro Mulling Entered By: Alejandro Mulling on 09/14/2015 15:18:49 Matthew Mayer ([id number]) -------------------------------------------------------------------------------- Patient/Caregiver Education Details Patient Name: Matthew Mayer Date of Service: 09/14/2015 3:00 PM Medical Record Number: [id number] Patient Account Number: [id number] Date of Birth/Gender: 1961/05/19 (54 y.o. Male) Treating RN: Phillis Haggis Primary Care Physician: Maudie Flakes Other Clinician: Referring Physician:  Maudie Flakes Treating Physician/Extender: BURNS III, Regis Bill in Treatment: 3 Education Assessment Education Provided To: Patient and Caregiver Education Topics  Provided Wound/Skin Impairment: Handouts: Other: change dressing as directed Methods: Demonstration, Explain/Verbal Responses: State content correctly Electronic Signature(s) Signed: 09/14/2015 4:19:08 PM By: Alejandro Mulling Entered By: Alejandro Mulling on 09/14/2015 15:42:54 Matthew Mayer ([id number]) -------------------------------------------------------------------------------- Wound Assessment Details Patient Name: Matthew Mayer Date of Service: 09/14/2015 3:00 PM Medical Record Number: [id number] Patient Account Number: [id number] Date of Birth/Sex: [birth year] (54 y.o. Male) Treating RN: Phillis Haggis Primary Care Physician: Maudie Flakes Other Clinician: Referring Physician: Maudie Flakes Treating Physician/Extender: BURNS III, Regis Bill in Treatment: 3 Wound Status Wound Number: 2 Primary Pressure Ulcer Etiology: Wound Location: Right Scrotum Wound Open Wounding Event: Not Known Status: Date Acquired: 08/30/2014 Comorbid Chronic Obstructive Pulmonary Disease Weeks Of Treatment: 3 History: (COPD), Sleep Apnea, Congestive Clustered Wound: No Heart Failure, Hypertension, Type II Diabetes, Neuropathy Photos Photo Uploaded By: Elliot Gurney, RN, BSN, Kim on 09/14/2015 16:50:16 Wound Measurements Length: (cm) 0.5 Width: (cm) 0.3 Depth: (cm) 0.1 Area: (cm) 0.118 Volume: (cm) 0.012 % Reduction in Area: 88.4% % Reduction in Volume: 88.2% Epithelialization: None Tunneling: No Undermining: No Wound Description Classification: Category/Stage III Wound Margin: Flat and Intact Exudate Amount: Small Exudate Type: Serous Exudate Color: amber Foul Odor After Cleansing: No Wound Bed Granulation Amount: None Present (0%) Exposed Structure Necrotic Amount: Large (67-100%) Fascia Exposed: No Necrotic Quality: Adherent Slough Fat Layer Exposed: No CODY, ALBUS. ([id number]) Tendon Exposed: No Muscle Exposed: No Joint Exposed: No Bone Exposed:  No Limited to Skin Breakdown Periwound Skin Texture Texture Color No Abnormalities Noted: No No Abnormalities Noted: No Callus: No Atrophie Blanche: No Crepitus: No Cyanosis: No Excoriation: No Ecchymosis: No Fluctuance: No Erythema: No Friable: No Hemosiderin Staining: No Induration: No Mottled: No Localized Edema: Yes Pallor: No Rash: No Rubor: No Scarring: No Temperature / Pain Moisture Tenderness on Palpation: Yes No Abnormalities Noted: No Dry / Scaly: No Maceration: No Moist: Yes Wound Preparation Ulcer Cleansing: Rinsed/Irrigated with Saline Topical Anesthetic Applied: Other: lidocaine 4%, Treatment Notes Wound #2 (Right Scrotum) 1. Cleansed with: Clean wound with Normal Saline 2. Anesthetic Topical Lidocaine 4% cream to wound bed prior to debridement 4. Dressing Applied: Prisma Ag Notes bandaid Electronic Signature(s) Signed: 09/14/2015 4:19:08 PM By: Alejandro Mulling Entered By: Alejandro Mulling on 09/14/2015 15:28:40 Matthew Mayer ([id number]) -------------------------------------------------------------------------------- Wound Assessment Details Patient Name: Matthew Mayer Date of Service: 09/14/2015 3:00 PM Medical Record Number: [id number] Patient Account Number: [id number] Date of Birth/Sex: [birth year] (54 y.o. Male) Treating RN: Phillis Haggis Primary Care Physician: Maudie Flakes Other Clinician: Referring Physician: Maudie Flakes Treating Physician/Extender: BURNS III, Regis Bill in Treatment: 3 Wound Status Wound Number: 3 Primary To be determined Etiology: Wound Location: Left Sacrum - Lateral Wound Open Wounding Event: Pressure Injury Status: Date Acquired: 09/12/2015 Comorbid Chronic Obstructive Pulmonary Disease Weeks Of Treatment: 0 History: (COPD), Sleep Apnea, Congestive Clustered Wound: No Heart Failure, Hypertension, Type II Diabetes, Neuropathy Photos Photo Uploaded By: Elliot Gurney, RN, BSN, Kim on  09/14/2015 16:50:17 Wound Measurements Length: (cm) 1 Width: (cm) 0.2 Depth: (cm) 0.1 Area: (cm) 0.157 Volume: (cm) 0.016 % Reduction in Area: % Reduction in Volume: Epithelialization: None Tunneling: No Undermining: No Wound Description Classification: Partial Thickness Wound Margin: Flat and Intact Exudate Amount: None Present Foul Odor After Cleansing: No Wound Bed Granulation Amount: Large (67-100%) Exposed Structure Granulation Quality: Pink Fascia Exposed: No Necrotic Amount: None Present (0%) Fat Layer Exposed: No Tendon Exposed: No  Muscle Exposed: No Matthew Mayer, Matthew Mayer. ([id number]) Joint Exposed: No Bone Exposed: No Limited to Skin Breakdown Periwound Skin Texture Texture Color No Abnormalities Noted: No No Abnormalities Noted: No Moisture Temperature / Pain No Abnormalities Noted: No Temperature: No Abnormality Wound Preparation Ulcer Cleansing: Rinsed/Irrigated with Saline Topical Anesthetic Applied: Other: lidocaine 4%, Treatment Notes Wound #3 (Left, Lateral Sacrum) 1. Cleansed with: Clean wound with Normal Saline 2. Anesthetic Topical Lidocaine 4% cream to wound bed prior to debridement 4. Dressing Applied: Prisma Ag Notes bandaid Electronic Signature(s) Signed: 09/14/2015 4:19:08 PM By: Alejandro Mulling Entered By: Alejandro Mulling on 09/14/2015 15:32:23 Matthew Mayer ([id number]) -------------------------------------------------------------------------------- Vitals Details Patient Name: Matthew Mayer Date of Service: 09/14/2015 3:00 PM Medical Record Number: [id number] Patient Account Number: [id number] Date of Birth/Sex: [birth year] (54 y.o. Male) Treating RN: Phillis Haggis Primary Care Physician: Maudie Flakes Other Clinician: Referring Physician: Maudie Flakes Treating Physician/Extender: BURNS III, Regis Bill in Treatment: 3 Vital Signs Time Taken: 15:19 Temperature (F): 98.6 Height (in): 67 Pulse (bpm):  73 Weight (lbs): 315 Respiratory Rate (breaths/min): 20 Body Mass Index (BMI): 49.3 Blood Pressure (mmHg): 157/76 Reference Range: 80 - 120 mg / dl Electronic Signature(s) Signed: 09/14/2015 4:19:08 PM By: Alejandro Mulling Entered By: Alejandro Mulling on 09/14/2015 15:22:00

## 2015-09-15 NOTE — Progress Notes (Signed)
DELORES, THELEN ([id number]) Visit Report for 09/14/2015 Chief Complaint Document Details Patient Name: Matthew Mayer, Matthew Mayer Date of Service: 09/14/2015 3:00 PM Medical Record Number: [id number] Patient Account Number: [id number] Date of Birth/Sex: 12/20/1960 (54 y.o. 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 New scrotal ulcer on left. Electronic Signature(s) Signed: 09/14/2015 3:48:01 PM By: Madelaine Bhat MD Entered By: Madelaine Bhat on 09/14/2015 15:42:47 Matthew Mayer ([id number]) -------------------------------------------------------------------------------- HPI Details Patient Name: Matthew Mayer Date of Service: 09/14/2015 3:00 PM Medical Record Number: [id number] Patient Account Number: [id number] Date of Birth/Sex: [birth year] (54 y.o. Male) Treating RN: Phillis Haggis Primary Care Physician: Maudie Flakes Other Clinician: Referring Physician: Maudie Flakes Treating Physician/Extender: BURNS III, Regis Bill in Treatment: 3 History of Present Illness Location: Patient has a ulcer to Sacrum HPI Description: Pleasant 54 year old with history of type 2 diabetes (hemoglobin A1c 11.5 in November 2016), COPD, congestive heart failure, chronic kidney disease, and obesity. Seen previously in the wound clinic for a sacral pressure ulcer, which remains healed (since January 2015). He developed an ulceration on the right side of his scrotum over a year ago which has not improved with multiple antibiotics and topical antifungal treatment. Biopsy cultured 08/25/2015 grew normal flora. Performing dressing changes with Prisma with progressive improvement. Awaiting urology consultation for consideration of wide excision and  primary closure. He returns to clinic for follow-up and says that he is developing a similar ulcer on his left scrotum. He reports moderate pain. Mild drainage. No fever or chills. Electronic Signature(s) Signed: 09/14/2015 3:48:01 PM By: Madelaine Bhat MD Entered By: Madelaine Bhat on 09/14/2015 15:44:20 Matthew Mayer ([id number]) -------------------------------------------------------------------------------- Physical Exam Details Patient Name: Matthew Mayer Date of Service: 09/14/2015 3:00 PM Medical Record Number: [id number] Patient Account Number: [id number] Date of Birth/Sex: 06-Feb-1961 (54 y.o. Male) Treating RN: Phillis Haggis Primary Care Physician: Maudie Flakes Other Clinician: Referring Physician: Maudie Flakes Treating Physician/Extender: BURNS III, WALTER Weeks in Treatment: 3 Constitutional . Pulse regular. Respirations normal and unlabored. Afebrile. Marland Kitchen Respiratory . Cardiovascular Femoral Pulses WNL, no bruits. Integumentary (Hair, Skin) .Marland Kitchen Neurological Sensation normal to touch, pin,and vibration. Psychiatric Judgement and insight Intact.. Oriented times 3.. No evidence of depression, anxiety, or agitation.. Notes Right sided scrotal ulceration much improved with silver collagen dressing changes. Full-thickness, though relatively superficial. No underlying abscess, sinus tract, or fistula. Consistent with pressure ulceration. No adjacent ulcer on the thigh. No cellulitis. Minimal surrounding erythema. New similar but much smaller ulceration on left scrotum. No evidence for infection. Electronic Signature(s) Signed: 09/14/2015 3:48:01 PM By: Madelaine Bhat MD Entered By: Madelaine Bhat on 09/14/2015 15:45:54 Matthew Mayer ([id number]) -------------------------------------------------------------------------------- Physician Orders Details Patient Name: Matthew Mayer Date of Service: 09/14/2015 3:00 PM Medical Record  Number: [id number] Patient Account Number: [id number] Date of Birth/Sex: Sep 09, 1961 (54 y.o. Male) Treating RN: Phillis Haggis Primary Care Physician: Maudie Flakes Other Clinician: Referring Physician: Maudie Flakes Treating Physician/Extender: BURNS III, Regis Bill in Treatment: 3 Verbal / Phone Orders: Yes ClinicianAshok Cordia, Debi Read Back and Verified: Yes Diagnosis Coding Wound Cleansing Wound #2 Right Scrotum o Clean wound with Normal Saline. Wound #3 Left,Lateral Sacrum o Clean wound with Normal Saline. Anesthetic Wound #2 Right Scrotum o Topical Lidocaine 4% cream applied to wound bed prior to debridement Wound #3 Left,Lateral Sacrum o Topical Lidocaine 4%  cream applied to wound bed prior to debridement Primary Wound Dressing Wound #2 Right Scrotum o Prisma Ag Wound #3 Left,Lateral Sacrum o Prisma Ag Dressing Change Frequency Wound #2 Right Scrotum o Change dressing every day. - and as needed Wound #3 Left,Lateral Sacrum o Change dressing every day. - and as needed Follow-up Appointments Wound #2 Right Scrotum o Return Appointment in 2 weeks. Wound #3 Left,Lateral Sacrum o Return Appointment in 2 weeks. Matthew Mayer, Matthew W. ([id number]) Electronic Signature(s) Signed: 09/14/2015 3:48:01 PM By: Madelaine BhatBurns, III, Walter MD Signed: 09/14/2015 4:19:08 PM By: Alejandro MullingPinkerton, Debra Entered By: Alejandro MullingPinkerton, Debra on 09/14/2015 15:39:51 Matthew Mayer, Matthew W. ([id number]) -------------------------------------------------------------------------------- Problem List Details Patient Name: Matthew Mayer, Matthew W. Date of Service: 09/14/2015 3:00 PM Medical Record Number: [id number] Patient Account Number: [id number] Date of Birth/Sex: [birth year] (54 y.o. 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 ([id number]) Inactive Problems Resolved Problems Electronic Signature(s) Signed: 09/14/2015 3:48:01 PM By: Madelaine BhatBurns, III, Walter MD Entered By: Madelaine BhatBurns, III, Walter on 09/14/2015 15:41:58 Matthew Mayer, Zakariyah W. ([id number]) -------------------------------------------------------------------------------- Progress Note Details Patient Name: Matthew Mayer, Matthew W. Date of Service: 09/14/2015 3:00 PM Medical Record Number: [id number] Patient Account Number: [id number] Date of Birth/Sex: [birth year] (54 y.o. 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 New scrotal ulcer on left. History of Present Illness (HPI) The following HPI elements were documented for the patient's wound: Location: Patient has a ulcer to Sacrum Pleasant 54 year old with history of type 2 diabetes (hemoglobin A1c 11.5 in November 2016), COPD, congestive heart failure, chronic kidney disease, and obesity. Seen previously in the wound clinic for a  sacral pressure ulcer, which remains healed (since January 2015). He developed an ulceration on the right side of his scrotum over a year ago which has not improved with multiple antibiotics and topical antifungal treatment. Biopsy cultured 08/25/2015 grew normal flora. Performing dressing changes with Prisma with progressive improvement. Awaiting urology consultation for consideration of wide excision and primary closure. He returns to clinic for follow-up and says that he is developing a similar ulcer on his left scrotum. He reports moderate pain. Mild drainage. No fever or chills. Objective Constitutional Pulse regular. Respirations normal and unlabored. Afebrile. Vitals Time Taken: 3:19 PM, Height: 67 in, Weight: 315 lbs, BMI: 49.3, Temperature: 98.6 F, Pulse: 73 bpm, Respiratory Rate: 20 breaths/min, Blood Pressure: 157/76 mmHg. Cardiovascular Femoral Pulses WNL, no bruits. Neurological Sensation normal to touch, pin,and vibration. Matthew Mayer, Matthew W. ([id number]) Psychiatric Judgement and insight Intact.. Oriented times 3.. No evidence of depression, anxiety, or agitation.. General Notes: Right sided scrotal ulceration much improved with silver collagen dressing changes. Full- thickness, though relatively superficial. No underlying abscess, sinus tract, or fistula. Consistent  with pressure ulceration. No adjacent ulcer on the thigh. No cellulitis. Minimal surrounding erythema. New similar but much smaller ulceration on left scrotum. No evidence for infection. Integumentary (Hair, Skin) Wound #2 status is Open. Original cause of wound was Not Known. The wound is located on the Right Scrotum. The wound measures 0.5cm length x 0.3cm width x 0.1cm depth; 0.118cm^2 area and 0.012cm^3 volume. The wound is limited to skin breakdown. There is no tunneling or undermining noted. There is a small amount of serous drainage noted. The wound margin is flat and intact. There is no granulation  within the wound bed. There is a large (67-100%) amount of necrotic tissue within the wound bed including Adherent Slough. The periwound skin appearance exhibited: Localized Edema, Moist. The periwound skin appearance did not exhibit: Callus, Crepitus, Excoriation, Fluctuance, Friable, Induration, Rash, Scarring, Dry/Scaly, Maceration, Atrophie Blanche, Cyanosis, Ecchymosis, Hemosiderin Staining, Mottled, Pallor, Rubor, Erythema. The periwound has tenderness on palpation. Wound #3 status is Open. Original cause of wound was Pressure Injury. The wound is located on the Left,Lateral Sacrum. The wound measures 1cm length x 0.2cm width x 0.1cm depth; 0.157cm^2 area and 0.016cm^3 volume. The wound is limited to skin breakdown. There is no tunneling or undermining noted. There is a none present amount of drainage noted. The wound margin is flat and intact. There is large (67- 100%) pink granulation within the wound bed. There is no necrotic tissue within the wound bed. Periwound temperature was noted as No Abnormality. Assessment Active Problems ICD-10 S31.30XS - Unspecified open wound of scrotum and testes, sequela S30.813A - Abrasion of scrotum and testes, initial encounter N50.82 - Scrotal pain E11.622 - Type 2 diabetes mellitus with other skin ulcer E11.22 - Type 2 diabetes mellitus with diabetic chronic kidney disease J44.9 - Chronic obstructive pulmonary disease, unspecified G47.30 - Sleep apnea, unspecified E66.9 - Obesity, unspecified I50.20 - Unspecified systolic (congestive) heart failure S31.30XA - Unspecified open wound of scrotum and testes, initial encounter Matthew Mayer, Matthew Mayer. ([id number]) Chronic right scrotal ulceration. New left scrotal ulceration. Consistent with pressure ulcerations. Plan Wound Cleansing: Wound #2 Right Scrotum: Clean wound with Normal Saline. Wound #3 Left,Lateral Sacrum: Clean wound with Normal Saline. Anesthetic: Wound #2 Right Scrotum: Topical  Lidocaine 4% cream applied to wound bed prior to debridement Wound #3 Left,Lateral Sacrum: Topical Lidocaine 4% cream applied to wound bed prior to debridement Primary Wound Dressing: Wound #2 Right Scrotum: Prisma Ag Wound #3 Left,Lateral Sacrum: Prisma Ag Dressing Change Frequency: Wound #2 Right Scrotum: Change dressing every day. - and as needed Wound #3 Left,Lateral Sacrum: Change dressing every day. - and as needed Follow-up Appointments: Wound #2 Right Scrotum: Return Appointment in 2 weeks. Wound #3 Left,Lateral Sacrum: Return Appointment in 2 weeks. Continue with Promogran Prisma dressing changes and a Band-Aid. Also apply to the new left scrotal ulcer. Patient wishes to hold off on urology consult. Electronic Signature(s) Signed: 09/14/2015 3:48:01 PM By: Madelaine Bhat MD TERRIN, MEDDAUGH ([id number]) Entered By: Madelaine Bhat on 09/14/2015 15:47:06 Matthew Mayer ([id number]) -------------------------------------------------------------------------------- SuperBill Details Patient Name: Matthew Mayer Date of Service: 09/14/2015 Medical Record Number: [id number] Patient Account Number: [id number] Date of Birth/Sex: 1961-08-25 (54 y.o. Male) Treating RN: Phillis Haggis Primary Care Physician: Maudie Flakes Other Clinician: Referring Physician: Maudie Flakes Treating Physician/Extender: BURNS III, Regis Bill in Treatment: 3 Diagnosis Coding ICD-10 Codes Code Description S31.30XD Unspecified open wound of scrotum and testes, subsequent encounter S30.813A Abrasion of scrotum and testes, initial encounter N50.82 Scrotal pain E11.622 Type  2 diabetes mellitus with other skin ulcer E11.22 Type 2 diabetes mellitus with diabetic chronic kidney disease J44.9 Chronic obstructive pulmonary disease, unspecified G47.30 Sleep apnea, unspecified E66.9 Obesity, unspecified I50.20 Unspecified systolic (congestive) heart failure Facility  Procedures CPT4 Code: [id number] Description: 54098 - WOUND CARE VISIT-LEV 2 EST PT Modifier: Quantity: 1 Physician Procedures CPT4 Code Description: [id number] 29562 - WC PHYS LEVEL 3 - EST PT ICD-10 Description Diagnosis S31.30XD Unspecified open wound of scrotum and testes, subs I50.20 Unspecified systolic (congestive) heart failure E11.622 Type 2 diabetes mellitus with other  skin ulcer Modifier: equent encou Quantity: 1 nter Electronic Signature(s) Signed: 09/14/2015 3:48:01 PM By: Madelaine Bhat MD Entered By: Madelaine Bhat on 09/14/2015 15:47:26

## 2015-09-28 ENCOUNTER — Encounter: Payer: BLUE CROSS/BLUE SHIELD | Attending: Surgery | Admitting: Surgery

## 2015-09-28 DIAGNOSIS — S3130XD Unspecified open wound of scrotum and testes, subsequent encounter: Secondary | ICD-10-CM | POA: Insufficient documentation

## 2015-09-28 DIAGNOSIS — S3130XS Unspecified open wound of scrotum and testes, sequela: Secondary | ICD-10-CM | POA: Diagnosis present

## 2015-09-28 DIAGNOSIS — I5020 Unspecified systolic (congestive) heart failure: Secondary | ICD-10-CM | POA: Diagnosis not present

## 2015-09-28 DIAGNOSIS — G4730 Sleep apnea, unspecified: Secondary | ICD-10-CM | POA: Insufficient documentation

## 2015-09-28 DIAGNOSIS — S30813A Abrasion of scrotum and testes, initial encounter: Secondary | ICD-10-CM | POA: Insufficient documentation

## 2015-09-28 DIAGNOSIS — E11622 Type 2 diabetes mellitus with other skin ulcer: Secondary | ICD-10-CM | POA: Diagnosis not present

## 2015-09-28 DIAGNOSIS — J449 Chronic obstructive pulmonary disease, unspecified: Secondary | ICD-10-CM | POA: Diagnosis not present

## 2015-09-28 DIAGNOSIS — N5082 Scrotal pain: Secondary | ICD-10-CM | POA: Insufficient documentation

## 2015-09-28 DIAGNOSIS — E1122 Type 2 diabetes mellitus with diabetic chronic kidney disease: Secondary | ICD-10-CM | POA: Insufficient documentation

## 2015-09-28 DIAGNOSIS — E669 Obesity, unspecified: Secondary | ICD-10-CM | POA: Insufficient documentation

## 2015-09-29 NOTE — Progress Notes (Addendum)
Matthew Mayer, Matthew W. ([id number]) Visit Report for 09/28/2015 Arrival Information Details Patient Name: Matthew Mayer, Matthew W. Date of Service: 09/28/2015 3:30 PM Medical Record Number: [id number] Patient Account Number: [id number] Date of Birth/Sex: [birth year] (54 y.o. Male) Treating RN: Phillis HaggisPinkerton, Debi Primary Care Physician: Maudie FlakesFeldpausch, Dale Other Clinician: Referring Physician: Maudie FlakesFeldpausch, Dale Treating Physician/Extender: BURNS III, Regis BillWALTER Weeks in Treatment: 5 Visit Information History Since Last Visit All ordered tests and consults were completed: No Patient Arrived: Ambulatory Any new allergies or adverse reactions: No Arrival Time: 15:48 Had a fall or experienced change in No Accompanied By: wife activities of daily living that may affect Transfer Assistance: None risk of falls: Patient Identification Verified: Yes Signs or symptoms of abuse/neglect since last No Secondary Verification Process Yes visito Completed: Hospitalized since last visit: No Patient Requires Transmission-Based No Pain Present Now: No Precautions: Patient Has Alerts: Yes Patient Alerts: DMII Electronic Signature(s) Signed: 09/28/2015 5:03:03 PM By: Alejandro MullingPinkerton, Debra Entered By: Alejandro MullingPinkerton, Debra on 09/28/2015 15:48:45 Matthew Mayer, Matthew W. ([id number]) -------------------------------------------------------------------------------- Clinic Level of Care Assessment Details Patient Name: Matthew Mayer, Matthew W. Date of Service: 09/28/2015 3:30 PM Medical Record Number: [id number] Patient Account Number: [id number] Date of Birth/Sex: [birth year] (54 y.o. Male) Treating RN: Phillis HaggisPinkerton, Debi Primary Care Physician: Maudie FlakesFeldpausch, Dale Other Clinician: Referring Physician: Maudie FlakesFeldpausch, Dale Treating Physician/Extender: BURNS III, Regis BillWALTER Weeks in Treatment: 5 Clinic Level of Care Assessment Items TOOL 4 Quantity Score []  - Use when only an EandM is performed on FOLLOW-UP visit 0 ASSESSMENTS - Nursing Assessment /  Reassessment []  - Reassessment of Co-morbidities (includes updates in patient status) 0 X - Reassessment of Adherence to Treatment Plan 1 5 ASSESSMENTS - Wound and Skin Assessment / Reassessment X - Simple Wound Assessment / Reassessment - one wound 1 5 []  - Complex Wound Assessment / Reassessment - multiple wounds 0 []  - Dermatologic / Skin Assessment (not related to wound area) 0 ASSESSMENTS - Focused Assessment []  - Circumferential Edema Measurements - multi extremities 0 []  - Nutritional Assessment / Counseling / Intervention 0 []  - Lower Extremity Assessment (monofilament, tuning fork, pulses) 0 []  - Peripheral Arterial Disease Assessment (using hand held doppler) 0 ASSESSMENTS - Ostomy and/or Continence Assessment and Care []  - Incontinence Assessment and Management 0 []  - Ostomy Care Assessment and Management (repouching, etc.) 0 PROCESS - Coordination of Care X - Simple Patient / Family Education for ongoing care 1 15 []  - Complex (extensive) Patient / Family Education for ongoing care 0 []  - Staff obtains ChiropractorConsents, Records, Test Results / Process Orders 0 []  - Staff telephones HHA, Nursing Homes / Clarify orders / etc 0 []  - Routine Transfer to another Facility (non-emergent condition) 0 Matthew Mayer, Matthew W. ([id number]) []  - Routine Hospital Admission (non-emergent condition) 0 []  - New Admissions / Manufacturing engineernsurance Authorizations / Ordering NPWT, Apligraf, etc. 0 []  - Emergency Hospital Admission (emergent condition) 0 []  - Simple Discharge Coordination 0 X - Complex (extensive) Discharge Coordination 1 15 PROCESS - Special Needs []  - Pediatric / Minor Patient Management 0 []  - Isolation Patient Management 0 []  - Hearing / Language / Visual special needs 0 []  - Assessment of Community assistance (transportation, D/C planning, etc.) 0 []  - Additional assistance / Altered mentation 0 []  - Support Surface(s) Assessment (bed, cushion, seat, etc.) 0 INTERVENTIONS - Wound Cleansing /  Measurement X - Simple Wound Cleansing - one wound 1 5 []  - Complex Wound Cleansing - multiple wounds 0 X - Wound Imaging (photographs - any number of wounds) 1 5 []  - Wound Tracing (  instead of photographs) 0 X - Simple Wound Measurement - one wound 1 5 []  - Complex Wound Measurement - multiple wounds 0 INTERVENTIONS - Wound Dressings X - Small Wound Dressing one or multiple wounds 0 10 []  - Medium Wound Dressing one or multiple wounds 0 []  - Large Wound Dressing one or multiple wounds 0 []  - Application of Medications - topical 0 []  - Application of Medications - injection 0 INTERVENTIONS - Miscellaneous []  - External ear exam 0 BRANDIS, MATSUURA ([id number]) []  - Specimen Collection (cultures, biopsies, blood, body fluids, etc.) 0 []  - Specimen(s) / Culture(s) sent or taken to Lab for analysis 0 []  - Patient Transfer (multiple staff / Michiel Sites Lift / Similar devices) 0 []  - Simple Staple / Suture removal (25 or less) 0 []  - Complex Staple / Suture removal (26 or more) 0 []  - Hypo / Hyperglycemic Management (close monitor of Blood Glucose) 0 []  - Ankle / Brachial Index (ABI) - do not check if billed separately 0 X - Vital Signs 1 5 Has the patient been seen at the hospital within the last three years: Yes Total Score: 60 Level Of Care: New/Established - Level 2 Electronic Signature(s) Signed: 09/28/2015 5:03:03 PM By: Alejandro Mulling Entered By: Alejandro Mulling on 09/28/2015 16:01:11 Matthew Mayer ([id number]) -------------------------------------------------------------------------------- Encounter Discharge Information Details Patient Name: Matthew Mayer Date of Service: 09/28/2015 3:30 PM Medical Record Number: [id number] Patient Account Number: [id number] Date of Birth/Sex: 06-Dec-1960 (54 y.o. Male) Treating RN: Huel Coventry Primary Care Physician: Maudie Flakes Other Clinician: Referring Physician: Maudie Flakes Treating Physician/Extender: BURNS III,  Regis Bill in Treatment: 5 Encounter Discharge Information Items Discharge Pain Level: 0 Discharge Condition: Stable Ambulatory Status: Ambulatory Discharge Destination: Home Transportation: Private Auto Accompanied By: wife Schedule Follow-up Appointment: No Medication Reconciliation completed and provided to Patient/Care Yes Matthew Mayer: Provided on Clinical Summary of Care: 09/28/2015 Form Type Recipient Paper Patient DT Electronic Signature(s) Signed: 09/28/2015 5:03:03 PM By: Alejandro Mulling Previous Signature: 09/28/2015 4:00:25 PM Version By: Gwenlyn Perking Entered By: Alejandro Mulling on 09/28/2015 16:01:56 Matthew Mayer ([id number]) -------------------------------------------------------------------------------- Lower Extremity Assessment Details Patient Name: Matthew Mayer Date of Service: 09/28/2015 3:30 PM Medical Record Number: [id number] Patient Account Number: [id number] Date of Birth/Sex: [birth year] (55 y.o. Male) Treating RN: Phillis Haggis Primary Care Physician: Maudie Flakes Other Clinician: Referring Physician: Maudie Flakes Treating Physician/Extender: BURNS III, Regis Bill in Treatment: 5 Electronic Signature(s) Signed: 09/28/2015 5:03:03 PM By: Alejandro Mulling Entered By: Alejandro Mulling on 09/28/2015 15:52:05 Matthew Mayer ([id number]) -------------------------------------------------------------------------------- Multi Wound Chart Details Patient Name: Matthew Mayer Date of Service: 09/28/2015 3:30 PM Medical Record Number: [id number] Patient Account Number: [id number] Date of Birth/Sex: 1960/12/22 (55 y.o. 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:Pressure Ulcer] [3:To be determined] [N/A:N/A] Comorbid History: [2:Chronic Obstructive Pulmonary Disease (COPD), Sleep Apnea, Congestive Heart Failure, Hypertension, Type II Diabetes, Neuropathy] [3:Chronic Obstructive Pulmonary Disease (COPD), Sleep Apnea, Congestive Heart Failure, Hypertension,  Type II Diabetes, Neuropathy] [N/A:N/A] Date Acquired: [2:08/30/2014] [3:09/12/2015] [N/A:N/A] Weeks of Treatment: [2:5] [3:2] [N/A:N/A] Wound Status: [2:Healed - Epithelialized] [3:Healed - Epithelialized] [N/A:N/A] Measurements L x W x D  0x0x0 [3:0x0x0] [N/A:N/A] (cm) Area (cm) : [2:0] [3:0] [N/A:N/A] Volume (cm) : [2:0] [3:0] [N/A:N/A] % Reduction in Area: [2:100.00%] [3:100.00%] [N/A:N/A] % Reduction in Volume: 100.00% [3:100.00%] [N/A:N/A] Classification: [2:Category/Stage III] [3:Partial Thickness] [N/A:N/A] Exudate Amount: [2:None Present] [3:None Present] [N/A:N/A] Wound Margin: [2:Flat and Intact] [3:Flat and Intact] [N/A:N/A] Granulation Amount: [2:None Present (0%)] [3:None Present (0%)] [N/A:N/A] Necrotic Amount: [2:None Present (0%)] [3:None Present (0%)] [N/A:N/A] Exposed Structures: [2:Fascia: No Fat: No Tendon: No Muscle: No Joint: No Bone: No] [3:N/A] [N/A:N/A] Limited to Skin Breakdown Epithelialization: Large (67-100%) Large (67-100%) N/A Periwound Skin Texture: Edema: No No Abnormalities Noted N/A Excoriation: No Induration: No Callus: No Crepitus: No Fluctuance: No Friable: No Rash: No Scarring: No Periwound Skin Maceration: No No Abnormalities Noted N/A Moisture: Moist: No Dry/Scaly: No Periwound Skin Color: Atrophie Blanche: No No Abnormalities Noted N/A Cyanosis: No Ecchymosis: No Erythema:  No Hemosiderin Staining: No Mottled: No Pallor: No Rubor: No Tenderness on Yes No N/A Palpation: Wound Preparation: Ulcer Cleansing: Not Ulcer Cleansing: Not N/A Cleansed Cleansed Topical Anesthetic Topical Anesthetic Applied: None, Other: Applied: None lidocaine 4% Treatment Notes Electronic Signature(s) Signed: 09/28/2015 5:03:03 PM By: Alejandro Mulling Entered By: Alejandro Mulling on 09/28/2015 15:59:30 Matthew Mayer ([id number]) -------------------------------------------------------------------------------- Multi-Disciplinary Care Plan Details Patient Name: Matthew Mayer Date of Service: 09/28/2015 3:30 PM Medical Record Number: [id number] Patient Account Number: [id number] Date of Birth/Sex: Aug 11, 1961 (54 y.o. Male) Treating RN: Phillis Haggis Primary Care Physician: Maudie Flakes Other Clinician: Referring Physician: Maudie Flakes Treating Physician/Extender: BURNS III, Regis Bill in Treatment: 5 Active Inactive Electronic Signature(s) Signed: 09/28/2015 5:03:03 PM By: Alejandro Mulling Entered By: Alejandro Mulling on 09/28/2015 16:37:31 Matthew Mayer ([id number]) -------------------------------------------------------------------------------- Pain Assessment Details Patient Name: Matthew Mayer Date of Service: 09/28/2015 3:30 PM Medical Record Number: [id number] Patient Account Number: [id number] Date of Birth/Sex: 18-Apr-1961 (54 y.o. Male) Treating RN: Phillis Haggis Primary Care Physician: Maudie Flakes Other Clinician: Referring Physician: Maudie Flakes Treating Physician/Extender: BURNS III, Regis Bill in Treatment: 5 Active Problems Location of Pain Severity and Description of Pain Patient Has Paino No Site Locations Pain Management and Medication Current Pain Management: Electronic Signature(s) Signed: 09/28/2015 5:03:03 PM By: Alejandro Mulling Entered By: Alejandro Mulling on 09/28/2015 15:48:52 Matthew Mayer  ([id number]) -------------------------------------------------------------------------------- Patient/Caregiver Education Details Patient Name: Matthew Mayer Date of Service: 09/28/2015 3:30 PM Medical Record Number: [id number] Patient Account Number: [id number] Date of Birth/Gender: 28-Apr-1961 (55 y.o. Male) Treating RN: Phillis Haggis Primary Care Physician: Maudie Flakes Other Clinician: Referring Physician: Maudie Flakes Treating Physician/Extender: BURNS III, Regis Bill in Treatment: 5 Education Assessment Education Provided To: Patient and Caregiver Education Topics Provided Wound/Skin Impairment: Handouts: Other: keep area keep and dry and monitor for wounds Methods: Demonstration, Explain/Verbal Responses: State content correctly Electronic Signature(s) Signed: 09/28/2015 5:03:03 PM By: Alejandro Mulling Entered By: Alejandro Mulling on 09/28/2015 16:02:23 Matthew Mayer ([id number]) -------------------------------------------------------------------------------- Wound Assessment Details Patient Name: Matthew Mayer Date of Service: 09/28/2015 3:30 PM Medical Record Number: [id number] Patient Account Number: [id number] Date of Birth/Sex: 04-02-1961 (55 y.o. Male) Treating RN: Phillis Haggis Primary Care Physician: Maudie Flakes Other Clinician: Referring Physician: Maudie Flakes Treating Physician/Extender: BURNS III, WALTER Weeks in Treatment: 5 Wound Status Wound Number: 2 Primary Pressure Ulcer Etiology: Wound Location: Right Scrotum Wound Healed - Epithelialized Wounding Event: Not Known Status: Date Acquired: 08/30/2014 Comorbid Chronic Obstructive Pulmonary Disease Weeks Of Treatment: 5 History: (COPD), Sleep Apnea, Congestive Clustered Wound: No Heart Failure, Hypertension, Type II Diabetes, Neuropathy Photos Photo Uploaded By: Alejandro Mulling on  09/28/2015 16:25:55 Wound Measurements Length: (cm) 0 Width: (cm) 0 Depth: (cm)  0 Area: (cm) 0 Volume: (cm) 0 % Reduction in Area: 100% % Reduction in Volume: 100% Epithelialization: Large (67-100%) Tunneling: No Undermining: No Wound Description Classification: Category/Stage III Wound Margin: Flat and Intact Exudate Amount: None Present Foul Odor After Cleansing: No Wound Bed Granulation Amount: None Present (0%) Exposed Structure Necrotic Amount: None Present (0%) Fascia Exposed: No Fat Layer Exposed: No Tendon Exposed: No Muscle Exposed: No Matthew Mayer, Matthew Mayer ([id number]) Joint Exposed: No Bone Exposed: No Limited to Skin Breakdown Periwound Skin Texture Texture Color No Abnormalities Noted: No No Abnormalities Noted: No Callus: No Atrophie Blanche: No Crepitus: No Cyanosis: No Excoriation: No Ecchymosis: No Fluctuance: No Erythema: No Friable: No Hemosiderin Staining: No Induration: No Mottled: No Localized Edema: No Pallor: No Rash: No Rubor: No Scarring: No Temperature / Pain Moisture Tenderness on Palpation: Yes No Abnormalities Noted: No Dry / Scaly: No Maceration: No Moist: No Wound Preparation Ulcer Cleansing: Not Cleansed Topical Anesthetic Applied: None, Other: lidocaine 4%, Electronic Signature(s) Signed: 09/28/2015 5:03:03 PM By: Alejandro Mulling Entered By: Alejandro Mulling on 09/28/2015 15:58:27 Matthew Mayer ([id number]) -------------------------------------------------------------------------------- Wound Assessment Details Patient Name: Matthew Mayer Date of Service: 09/28/2015 3:30 PM Medical Record Number: [id number] Patient Account Number: [id number] Date of Birth/Sex: 04-17-1961 (55 y.o. Male) Treating RN: Phillis Haggis Primary Care Physician: Maudie Flakes Other Clinician: Referring Physician: Maudie Flakes Treating Physician/Extender: BURNS III, WALTER Weeks in Treatment: 5 Wound Status Wound Number: 3 Primary To be determined Etiology: Wound Location: Left Sacrum - Lateral Wound  Healed - Epithelialized Wounding Event: Pressure Injury Status: Date Acquired: 09/12/2015 Comorbid Chronic Obstructive Pulmonary Disease Weeks Of Treatment: 2 History: (COPD), Sleep Apnea, Congestive Clustered Wound: No Heart Failure, Hypertension, Type II Diabetes, Neuropathy Photos Photo Uploaded By: Alejandro Mulling on 09/28/2015 16:25:56 Wound Measurements Length: (cm) 0 % Reduction in Width: (cm) 0 % Reduction in Depth: (cm) 0 Epithelializat Area: (cm) 0 Tunneling: Volume: (cm) 0 Undermining: Area: 100% Volume: 100% ion: Large (67-100%) No No Wound Description Classification: Partial Thickness Wound Margin: Flat and Intact Exudate Amount: None Present Foul Odor After Cleansing: No Wound Bed Granulation Amount: None Present (0%) Necrotic Amount: None Present (0%) Periwound Skin Texture Texture Color Matthew Mayer, Matthew Mayer. ([id number]) No Abnormalities Noted: No No Abnormalities Noted: No Moisture No Abnormalities Noted: No Wound Preparation Ulcer Cleansing: Not Cleansed Topical Anesthetic Applied: None Electronic Signature(s) Signed: 09/28/2015 5:03:03 PM By: Alejandro Mulling Entered By: Alejandro Mulling on 09/28/2015 15:56:34 Matthew Mayer ([id number]) -------------------------------------------------------------------------------- Vitals Details Patient Name: Matthew Mayer Date of Service: 09/28/2015 3:30 PM Medical Record Number: [id number] Patient Account Number: [id number] Date of Birth/Sex: 10-Aug-1961 (55 y.o. Male) Treating RN: Phillis Haggis Primary Care Physician: Maudie Flakes Other Clinician: Referring Physician: Maudie Flakes Treating Physician/Extender: BURNS III, WALTER Weeks in Treatment: 5 Vital Signs Time Taken: 15:49 Temperature (F): 98.6 Height (in): 67 Pulse (bpm): 85 Weight (lbs): 315 Respiratory Rate (breaths/min): 20 Body Mass Index (BMI): 49.3 Blood Pressure (mmHg): 127/61 Reference Range: 80 - 120 mg /  dl Electronic Signature(s) Signed: 09/28/2015 5:03:03 PM By: Alejandro Mulling Entered By: Alejandro Mulling on 09/28/2015 15:51:57

## 2015-09-29 NOTE — Progress Notes (Signed)
IREN, WHIPP ([id number]) Visit Report for 09/28/2015 Chief Complaint Document Details Patient Name: Matthew Mayer, Matthew Mayer Date of Service: 09/28/2015 3:30 PM Medical Record Number: [id number] Patient Account Number: [id number] Date of Birth/Sex: 1961/07/03 (54 y.o. 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 New scrotal ulcer on left. Electronic Signature(s) Signed: 09/29/2015 9:25:13 AM By: Madelaine Bhat MD Entered By: Madelaine Bhat on 09/29/2015 09:22:30 Matthew Mayer ([id number]) -------------------------------------------------------------------------------- HPI Details Patient Name: Matthew Mayer Date of Service: 09/28/2015 3:30 PM Medical Record Number: [id number] Patient Account Number: [id number] Date of Birth/Sex: 12-Feb-1961 (54 y.o. Male) Treating RN: Huel Coventry Primary Care Physician: Maudie Flakes Other Clinician: Referring Physician: Maudie Flakes Treating Physician/Extender: BURNS III, Regis Bill in Treatment: 5 History of Present Illness Location: Patient has a ulcer to Sacrum HPI Description: Pleasant 55 year old with history of type 2 diabetes (hemoglobin A1c 11.5 in November 2016), COPD, congestive heart failure, chronic kidney disease, and obesity. Seen previously in the wound clinic for a sacral pressure ulcer, which remains healed (since January 2015). He developed an ulceration on the right side of his scrotum over a year ago which has not improved with multiple antibiotics and topical antifungal treatment. Biopsy cultured 08/25/2015 grew normal flora. Performing dressing changes with Prisma with progressive improvement. He returns to clinic for follow-up and says that both scrotal ulcerations have healed.  No drainage. No significant pain. Electronic Signature(s) Signed: 09/29/2015 9:25:13 AM By: Madelaine Bhat MD Entered By: Madelaine Bhat on 09/29/2015 09:23:07 Matthew Mayer ([id number]) -------------------------------------------------------------------------------- Physical Exam Details Patient Name: Matthew Mayer Date of Service: 09/28/2015 3:30 PM Medical Record Number: [id number] Patient Account Number: [id number] Date of Birth/Sex: [birth year] (54 y.o. Male) Treating RN: Huel Coventry Primary Care Physician: Maudie Flakes Other Clinician: Referring Physician: Maudie Flakes Treating Physician/Extender: BURNS III, Ysabella Babiarz Weeks in Treatment: 5 Constitutional . Pulse regular. Respirations normal and unlabored. Afebrile. . Notes Bilateral scrotal ulcerations are completely re-epithelialized. No drainage. No evidence for infection. No significant tenderness. Electronic Signature(s) Signed: 09/29/2015 9:25:13 AM By: Madelaine Bhat MD Entered By: Madelaine Bhat on 09/29/2015 09:23:49 Matthew Mayer ([id number]) -------------------------------------------------------------------------------- Physician Orders Details Patient Name: Matthew Mayer Date of Service: 09/28/2015 3:30 PM Medical Record Number: [id number] Patient Account Number: [id number] Date of Birth/Sex: [birth year] (54 y.o. Male) Treating RN: Phillis Haggis Primary Care Physician: Maudie Flakes Other Clinician: Referring Physician: Maudie Flakes Treating Physician/Extender: BURNS III, Regis Bill in Treatment: 5 Verbal / Phone Orders: Yes Clinician: Ashok Cordia, Debi Read Back and Verified: Yes Diagnosis Coding Discharge From [facility] o Discharge from [facility] - all wounds healed Electronic Signature(s) Signed: 09/28/2015 5:03:03 PM By: Alejandro Mulling Signed: 09/29/2015 8:51:38 AM By: Madelaine Bhat MD Entered By: Alejandro Mulling on 09/28/2015 16:00:12 Matthew Mayer ([id number]) -------------------------------------------------------------------------------- Problem List Details Patient Name: Matthew Mayer Date of Service: 09/28/2015 3:30 PM Medical Record Number: [id number] Patient Account Number: [id number] Date of Birth/Sex: 04-05-1961 (55 y.o. 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 08/24/2015 Yes disease J44.9 Chronic obstructive pulmonary disease, unspecified 08/24/2015 Yes G47.30 Sleep apnea, unspecified 08/24/2015 Yes E66.9 Obesity, unspecified 08/24/2015 Yes I50.20 Unspecified systolic (congestive) heart failure 08/24/2015 Yes S31.30XD Unspecified open wound of scrotum and testes, 09/14/2015 Yes subsequent encounter Matthew Mayer, Matthew Mayer ([id number]) Inactive Problems Resolved Problems Electronic Signature(s) Signed: 09/29/2015 9:25:13 AM By: Madelaine Bhat MD Entered By: Madelaine Bhat on 09/29/2015 09:22:10 Matthew Mayer ([id number]) -------------------------------------------------------------------------------- Progress Note Details Patient Name: Matthew Mayer Date of Service: 09/28/2015 3:30 PM Medical Record Number: [id number] Patient Account Number: [id number] Date of Birth/Sex: 08/03/1961 (54 y.o. 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 New scrotal ulcer on left. History of Present Illness (HPI) The following HPI elements were documented for the patient's wound: Location: Patient has a ulcer to Sacrum Pleasant 55 year old with history of type 2 diabetes (hemoglobin A1c 11.5 in November 2016), COPD, congestive heart failure, chronic kidney disease, and obesity. Seen previously in the wound clinic for a sacral pressure ulcer, which remains healed (since January 2015). He developed an ulceration on the right side of his scrotum over a year ago which has not improved with multiple antibiotics and topical antifungal treatment. Biopsy cultured 08/25/2015 grew normal flora. Performing dressing changes with Prisma with progressive improvement. He returns to clinic for follow-up and says that both scrotal ulcerations have healed. No drainage. No significant pain. Objective Constitutional Pulse regular. Respirations normal and unlabored. Afebrile. Vitals Time Taken: 3:49 PM, Height: 67 in, Weight: 315 lbs, BMI: 49.3, Temperature: 98.6 F, Pulse: 85 bpm, Respiratory Rate: 20 breaths/min, Blood Pressure: 127/61 mmHg. General Notes: Bilateral scrotal ulcerations are completely re-epithelialized. No drainage. No evidence for infection. No significant tenderness. Integumentary (Hair, Skin) Wound #2 status is Healed - Epithelialized. Original cause of wound was Not Known. The wound is located [place]. ([id number]) on the Right Scrotum. The wound measures 0cm length x 0cm width x 0cm depth; 0cm^2 area and 0cm^3 volume. The wound is limited to skin breakdown. There is no tunneling or undermining noted. There is a none present amount of drainage noted. The wound margin is flat and intact. There is no granulation within the wound bed. There is no necrotic tissue within the wound bed. The periwound skin  appearance did not exhibit: Callus, Crepitus, Excoriation, Fluctuance, Friable, Induration, Localized Edema, Rash, Scarring, Dry/Scaly, Maceration, Moist, Atrophie Blanche, Cyanosis, Ecchymosis, Hemosiderin Staining, Mottled, Pallor, Rubor, Erythema. The periwound has tenderness on palpation. Wound #3 status is Healed - Epithelialized. Original cause of wound was Pressure Injury. The wound is located on the Left,Lateral Sacrum. The wound measures 0cm length x 0cm width x 0cm depth; 0cm^2 area and 0cm^3 volume. There is no tunneling or undermining noted. There is a none present amount of drainage noted. The wound margin is flat and intact. There is no granulation within the wound bed. There is no necrotic tissue within the wound bed. Assessment Active Problems ICD-10 S31.30XS - Unspecified open wound of scrotum and testes, sequela S30.813A - Abrasion of scrotum and testes, initial encounter N50.82 - Scrotal pain E11.622 - Type 2 diabetes mellitus with other skin ulcer E11.22 - Type 2 diabetes mellitus with diabetic chronic kidney disease J44.9 - Chronic obstructive pulmonary disease, unspecified G47.30 - Sleep apnea,  unspecified E66.9 - Obesity, unspecified I50.20 - Unspecified systolic (congestive) heart failure S31.30XD - Unspecified open wound of scrotum and testes, subsequent encounter Healed scrotal ulcerations. [facility] Matthew OharaERRELL, Matthew W. ([id number]) Eucerin cream. I encouraged him to call with any questions or concerns. Otherwise, we will plan on seeing him back in the wound clinic prn. Electronic Signature(s) Signed: 09/29/2015 9:25:13 AM By: Madelaine BhatBurns, III, Batu Cassin MD Entered By: Madelaine BhatBurns, III, Savannah Erbe on 09/29/2015 09:24:28 Matthew OharaERRELL, Matthew W. ([id number]) -------------------------------------------------------------------------------- SuperBill Details Patient Name: Matthew OharaERRELL, Matthew W. Date of Service:  09/28/2015 Medical Record Number: [id number] Patient Account Number: [id number] Date of Birth/Sex: July 31, 1961 (54 y.o. 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 chronic kidney disease J44.9 Chronic obstructive pulmonary disease, unspecified G47.30 Sleep apnea, unspecified E66.9 Obesity, unspecified I50.20 Unspecified systolic (congestive) heart failure Facility Procedures CPT4 Code: [id number] Description: [id number] - WOUND CARE VISIT-LEV 2 EST PT Modifier: Quantity: 1 Physician Procedures CPT4 Code Description: [id number] - WC PHYS LEVEL 2 - EST PT ICD-10 Description Diagnosis S31.30XD Unspecified open wound of scrotum and testes, subs Modifier: equent encou Quantity: 1 nter Electronic Signature(s) Signed: 09/29/2015 9:25:13 AM By: Madelaine BhatBurns, III, Sukhraj Esquivias MD Previous Signature: 09/28/2015 5:03:03 PM Version By: Alejandro MullingPinkerton, Debra Previous Signature: 09/29/2015 8:51:38 AM Version By: Madelaine BhatBurns, III, Albertus Chiarelli MD Entered By: Madelaine BhatBurns, III, Calistro Rauf on 09/29/2015 09:24:55

## 2016-06-23 DIAGNOSIS — K859 Acute pancreatitis without necrosis or infection, unspecified: Secondary | ICD-10-CM | POA: Insufficient documentation

## 2016-07-07 DIAGNOSIS — N184 Chronic kidney disease, stage 4 (severe): Secondary | ICD-10-CM

## 2016-07-07 DIAGNOSIS — J9612 Chronic respiratory failure with hypercapnia: Secondary | ICD-10-CM

## 2016-07-07 DIAGNOSIS — N179 Acute kidney failure, unspecified: Secondary | ICD-10-CM | POA: Insufficient documentation

## 2016-07-07 DIAGNOSIS — J9611 Chronic respiratory failure with hypoxia: Secondary | ICD-10-CM | POA: Insufficient documentation

## 2016-08-07 ENCOUNTER — Encounter: Payer: BLUE CROSS/BLUE SHIELD | Attending: Internal Medicine | Admitting: Internal Medicine

## 2016-08-07 DIAGNOSIS — Z930 Tracheostomy status: Secondary | ICD-10-CM | POA: Insufficient documentation

## 2016-08-07 DIAGNOSIS — Z87891 Personal history of nicotine dependence: Secondary | ICD-10-CM | POA: Diagnosis not present

## 2016-08-07 DIAGNOSIS — Z992 Dependence on renal dialysis: Secondary | ICD-10-CM | POA: Diagnosis not present

## 2016-08-07 DIAGNOSIS — Y839 Surgical procedure, unspecified as the cause of abnormal reaction of the patient, or of later complication, without mention of misadventure at the time of the procedure: Secondary | ICD-10-CM | POA: Diagnosis not present

## 2016-08-07 DIAGNOSIS — T8131XD Disruption of external operation (surgical) wound, not elsewhere classified, subsequent encounter: Secondary | ICD-10-CM | POA: Insufficient documentation

## 2016-08-07 DIAGNOSIS — N184 Chronic kidney disease, stage 4 (severe): Secondary | ICD-10-CM | POA: Insufficient documentation

## 2016-08-07 DIAGNOSIS — G4733 Obstructive sleep apnea (adult) (pediatric): Secondary | ICD-10-CM | POA: Diagnosis not present

## 2016-08-07 DIAGNOSIS — L97111 Non-pressure chronic ulcer of right thigh limited to breakdown of skin: Secondary | ICD-10-CM | POA: Insufficient documentation

## 2016-08-07 DIAGNOSIS — J449 Chronic obstructive pulmonary disease, unspecified: Secondary | ICD-10-CM | POA: Insufficient documentation

## 2016-08-07 DIAGNOSIS — I509 Heart failure, unspecified: Secondary | ICD-10-CM | POA: Diagnosis not present

## 2016-08-07 DIAGNOSIS — I130 Hypertensive heart and chronic kidney disease with heart failure and stage 1 through stage 4 chronic kidney disease, or unspecified chronic kidney disease: Secondary | ICD-10-CM | POA: Insufficient documentation

## 2016-08-07 DIAGNOSIS — Z6841 Body Mass Index (BMI) 40.0 and over, adult: Secondary | ICD-10-CM | POA: Diagnosis not present

## 2016-08-07 DIAGNOSIS — Z794 Long term (current) use of insulin: Secondary | ICD-10-CM | POA: Insufficient documentation

## 2016-08-07 DIAGNOSIS — E1140 Type 2 diabetes mellitus with diabetic neuropathy, unspecified: Secondary | ICD-10-CM | POA: Diagnosis not present

## 2016-08-07 DIAGNOSIS — E669 Obesity, unspecified: Secondary | ICD-10-CM | POA: Diagnosis not present

## 2016-08-07 DIAGNOSIS — E1122 Type 2 diabetes mellitus with diabetic chronic kidney disease: Secondary | ICD-10-CM | POA: Diagnosis not present

## 2016-08-07 DIAGNOSIS — Z880 Allergy status to penicillin: Secondary | ICD-10-CM | POA: Insufficient documentation

## 2016-08-08 NOTE — Progress Notes (Signed)
Matthew Mayer, Constantine W. ([id number]) Visit Report for 08/07/2016 Abuse/Suicide Risk Screen Details Patient Name: Matthew Mayer, Vitor W. Date of Service: 08/07/2016 8:00 AM Medical Record Patient Account Number: [id number] Number: Treating RN: Curtis SitesDorthy, Joanna 1961/02/11 (55 y.o. Other Clinician: Date of Birth/Sex: Male) Treating ROBSON, MICHAEL Primary Care Physician: Maudie FlakesFeldpausch, Dale Physician/Extender: G Referring Physician: Kandis CockingFeldpausch, Dale Weeks in Treatment: 0 Abuse/Suicide Risk Screen Items Answer ABUSE/SUICIDE RISK SCREEN: Has anyone close to you tried to hurt or harm you recentlyo No Do you feel uncomfortable with anyone in your familyo No Has anyone forced you do things that you didnot want to doo No Do you have any thoughts of harming yourselfo No Patient displays signs or symptoms of abuse and/or neglect. No Electronic Signature(s) Signed: 08/07/2016 4:35:41 PM By: Curtis Sitesorthy, Joanna Entered By: Curtis Sitesorthy, Joanna on 08/07/2016 08:23:34 Matthew Mayer, Rishith W. ([id number]) -------------------------------------------------------------------------------- Activities of Daily Living Details Patient Name: Matthew Mayer, Osker W. Date of Service: 08/07/2016 8:00 AM Medical Record Patient Account Number: [id number] Number: Treating RN: Curtis SitesDorthy, Joanna 1961/02/11 (55 y.o. Other Clinician: Date of Birth/Sex: Male) Treating ROBSON, MICHAEL Primary Care Physician: Maudie FlakesFeldpausch, Dale Physician/Extender: G Referring Physician: Kandis CockingFeldpausch, Dale Weeks in Treatment: 0 Activities of Daily Living Items Answer Activities of Daily Living (Please select one for each item) Drive Automobile Completely Able Take Medications Completely Able Use Telephone Completely Able Care for Appearance Completely Able Use Toilet Completely Able Bath / Shower Completely Able Dress Self Completely Able Feed Self Completely Able Walk Completely Able Get In / Out Bed Completely Able Housework Completely  Able Prepare Meals Completely Able Handle Money Completely Able Shop for Self Completely Able Electronic Signature(s) Signed: 08/07/2016 4:35:41 PM By: Curtis Sitesorthy, Joanna Entered By: Curtis Sitesorthy, Joanna on 08/07/2016 08:23:53 Matthew Mayer, Jujhar W. ([id number]) -------------------------------------------------------------------------------- Education Assessment Details Patient Name: Matthew Mayer, Alwyn W. Date of Service: 08/07/2016 8:00 AM Medical Record Patient Account Number: [id number] Number: Treating RN: Curtis Sitesorthy, Joanna 1961/02/11 (55 y.o. Other Clinician: Date of Birth/Sex: Male) Treating ROBSON, MICHAEL Primary Care Physician: Maudie FlakesFeldpausch, Dale Physician/Extender: G Referring Physician: Kandis CockingFeldpausch, Dale Weeks in Treatment: 0 Primary Learner Assessed: Caregiver Reason Patient is not Primary Learner: wound location Learning Preferences/Education Level/Primary Language Learning Preference: Explanation, Demonstration Highest Education Level: College or Above Preferred Language: English Cognitive Barrier Assessment/Beliefs Language Barrier: No Translator Needed: No Memory Deficit: No Emotional Barrier: No Cultural/Religious Beliefs Affecting Medical No Care: Physical Barrier Assessment Impaired Vision: No Impaired Hearing: No Decreased Hand dexterity: No Knowledge/Comprehension Assessment Knowledge Level: Medium Comprehension Level: Medium Ability to understand written Medium instructions: Ability to understand verbal Medium instructions: Motivation Assessment Anxiety Level: Calm Cooperation: Cooperative Education Importance: Acknowledges Need Interest in Health Problems: Asks Questions Perception: Coherent Willingness to Engage in Self- Medium Management Activities: Medium Matthew Mayer, Kenan W. ([id number]) Readiness to Engage in Self- Management Activities: Electronic Signature(s) Signed: 08/07/2016 4:35:41 PM By: Curtis Sitesorthy, Joanna Entered By: Curtis Sitesorthy, Joanna on  08/07/2016 08:24:27 Matthew Mayer, Tinsley W. ([id number]) -------------------------------------------------------------------------------- Fall Risk Assessment Details Patient Name: Matthew Mayer, Windell W. Date of Service: 08/07/2016 8:00 AM Medical Record Patient Account Number: [id number] Number: Treating RN: Curtis SitesDorthy, Joanna 1961/02/11 (55 y.o. Other Clinician: Date of Birth/Sex: Male) Treating ROBSON, MICHAEL Primary Care Physician: Maudie FlakesFeldpausch, Dale Physician/Extender: G Referring Physician: Kandis CockingFeldpausch, Dale Weeks in Treatment: 0 Fall Risk Assessment Items Have you had 2 or more falls in the last 12 monthso 0 No Have you had any fall that resulted in injury in the last 12 monthso 0 No FALL RISK ASSESSMENT: History of falling - immediate or within 3 months 25 Yes Secondary diagnosis  0 No Ambulatory aid None/bed rest/wheelchair/nurse 0 Yes Crutches/cane/walker 0 No Furniture 0 No IV Access/Saline Lock 0 No Gait/Training Normal/bed rest/immobile 0 No Weak 10 Yes Impaired 0 No Mental Status Oriented to own ability 0 Yes Electronic Signature(s) Signed: 08/07/2016 4:35:41 PM By: Curtis Sitesorthy, Joanna Entered By: Curtis Sitesorthy, Joanna on 08/07/2016 08:27:17 Matthew Mayer, Dailan W. ([id number]) -------------------------------------------------------------------------------- Nutrition Risk Assessment Details Patient Name: Matthew Mayer, Eliyah W. Date of Service: 08/07/2016 8:00 AM Medical Record Patient Account Number: [id number] Number: Treating RN: Curtis SitesDorthy, Joanna 02-11-1961 (55 y.o. Other Clinician: Date of Birth/Sex: Male) Treating ROBSON, MICHAEL Primary Care Physician: Maudie FlakesFeldpausch, Dale Physician/Extender: G Referring Physician: Kandis CockingFeldpausch, Dale Weeks in Treatment: 0 Height (in): 67 Weight (lbs): 273 Body Mass Index (BMI): 42.8 Nutrition Risk Assessment Items NUTRITION RISK SCREEN: I have an illness or condition that made me change the kind and/or 0 No amount of food I eat I eat  fewer than two meals per day 0 No I eat few fruits and vegetables, or milk products 0 No I have three or more drinks of beer, liquor or wine almost every day 0 No I have tooth or mouth problems that make it hard for me to eat 0 No I don't always have enough money to buy the food I need 0 No I eat alone most of the time 0 No I take three or more different prescribed or over-the-counter drugs a 1 Yes day Without wanting to, I have lost or gained 10 pounds in the last six 0 No months I am not always physically able to shop, cook and/or feed myself 0 No Nutrition Protocols Good Risk Protocol 0 No interventions needed Moderate Risk Protocol Electronic Signature(s) Signed: 08/07/2016 4:35:41 PM By: Curtis Sitesorthy, Joanna Entered By: Curtis Sitesorthy, Joanna on 08/07/2016 40:[id number]:1108:27:24

## 2016-08-08 NOTE — Progress Notes (Addendum)
GURVIR, SCHROM ([id number]) Visit Report for 08/07/2016 Allergy List Details Patient Name: Matthew Mayer, Matthew Mayer Date of Service: 08/07/2016 8:00 AM Medical Record Number: [id number] Patient Account Number: [id number] Date of Birth/Sex: 1961/06/17 (55 y.o. Male) Treating RN: Curtis Sites Primary Care Physician: Maudie Flakes Other Clinician: Referring Physician: Maudie Flakes Treating Physician/Extender: Maxwell Caul Weeks in Treatment: 0 Allergies Active Allergies Penicillins Reaction: anaphalyxis Severity: Severe Augmentin codeine clarithromycin Allergy Notes Electronic Signature(s) Signed: 08/07/2016 4:35:41 PM By: Curtis Sites Entered By: Curtis Sites on 08/07/2016 78:29:56 Matthew Mayer ([id number]) -------------------------------------------------------------------------------- Arrival Information Details Patient Name: Matthew Mayer Date of Service: 08/07/2016 8:00 AM Medical Record Number: [id number] Patient Account Number: [id number] Date of Birth/Sex: 04-01-1961 (55 y.o. Male) Treating RN: Curtis Sites Primary Care Physician: Maudie Flakes Other Clinician: Referring Physician: Maudie Flakes Treating Physician/Extender: Altamese [place] in Treatment: 0 Visit Information Patient Arrived: Ambulatory Arrival Time: 08:14 Accompanied By: spouse Transfer Assistance: None Patient Identification Verified: Yes Secondary Verification Process Yes Completed: Patient Has Alerts: Yes Patient Alerts: DMII History Since Last Visit Added or deleted any medications: No Any new allergies or adverse reactions: No Had a fall or experienced change in activities of daily living that may affect risk of falls: No Signs or symptoms of abuse/neglect since last visito No Hospitalized since last visit: No Pain Present Now: No Electronic Signature(s) Signed: 08/07/2016 4:35:41 PM By: Curtis Sites Entered By: Curtis Sites on 08/07/2016  08:16:42 Matthew Mayer ([id number]) -------------------------------------------------------------------------------- Clinic Level of Care Assessment Details Patient Name: Matthew Mayer Date of Service: 08/07/2016 8:00 AM Medical Record Number: [id number] Patient Account Number: [id number] Date of Birth/Sex: [birth year] (55 y.o. Male) Treating RN: Curtis Sites Primary Care Physician: Maudie Flakes Other Clinician: Referring Physician: Maudie Flakes Treating Physician/Extender: Maxwell Caul Weeks in Treatment: 0 Clinic Level of Care Assessment Items TOOL 2 Quantity Score []  - Use when only an EandM is performed on the INITIAL visit 0 ASSESSMENTS - Nursing Assessment / Reassessment X - General Physical Exam (combine w/ comprehensive assessment (listed just 1 20 below) when performed on new pt. evals) X - Comprehensive Assessment (HX, ROS, Risk Assessments, Wounds Hx, etc.) 1 25 ASSESSMENTS - Wound and Skin Assessment / Reassessment X - Simple Wound Assessment / Reassessment - one wound 1 5 []  - Complex Wound Assessment / Reassessment - multiple wounds 0 []  - Dermatologic / Skin Assessment (not related to wound area) 0 ASSESSMENTS - Ostomy and/or Continence Assessment and Care []  - Incontinence Assessment and Management 0 []  - Ostomy Care Assessment and Management (repouching, etc.) 0 PROCESS - Coordination of Care X - Simple Patient / Family Education for ongoing care 1 15 []  - Complex (extensive) Patient / Family Education for ongoing care 0 []  - Staff obtains Chiropractor, Records, Test Results / Process Orders 0 []  - Staff telephones HHA, Nursing Homes / Clarify orders / etc 0 []  - Routine Transfer to another Facility (non-emergent condition) 0 []  - Routine Hospital Admission (non-emergent condition) 0 []  - New Admissions / Manufacturing engineer / Ordering NPWT, Apligraf, etc. 0 []  - Emergency Hospital Admission (emergent condition) 0 X - Simple Discharge  Coordination 1 10 Lofstrom, Saben W. ([id number]) []  - Complex (extensive) Discharge Coordination 0 PROCESS - Special Needs []  - Pediatric / Minor Patient Management 0 []  - Isolation Patient Management 0 []  - Hearing / Language / Visual special needs 0 []  - Assessment of Community assistance (transportation, D/C planning, etc.) 0 []  - Additional assistance / Altered mentation 0 []  -  Support Surface(s) Assessment (bed, cushion, seat, etc.) 0 INTERVENTIONS - Wound Cleansing / Measurement X - Wound Imaging (photographs - any number of wounds) 1 5 []  - Wound Tracing (instead of photographs) 0 X - Simple Wound Measurement - one wound 1 5 []  - Complex Wound Measurement - multiple wounds 0 X - Simple Wound Cleansing - one wound 1 5 []  - Complex Wound Cleansing - multiple wounds 0 INTERVENTIONS - Wound Dressings X - Small Wound Dressing one or multiple wounds 1 10 []  - Medium Wound Dressing one or multiple wounds 0 []  - Large Wound Dressing one or multiple wounds 0 []  - Application of Medications - injection 0 INTERVENTIONS - Miscellaneous []  - External ear exam 0 []  - Specimen Collection (cultures, biopsies, blood, body fluids, etc.) 0 []  - Specimen(s) / Culture(s) sent or taken to Lab for analysis 0 []  - Patient Transfer (multiple staff / Nurse, adult / Similar devices) 0 []  - Simple Staple / Suture removal (25 or less) 0 []  - Complex Staple / Suture removal (26 or more) 0 Helvie, Jyquan W. ([id number]) []  - Hypo / Hyperglycemic Management (close monitor of Blood Glucose) 0 []  - Ankle / Brachial Index (ABI) - do not check if billed separately 0 Has the patient been seen at the hospital within the last three years: Yes Total Score: 100 Level Of Care: New/Established - Level 3 Electronic Signature(s) Signed: 08/07/2016 4:35:41 PM By: Curtis Sites Entered By: Curtis Sites on 08/07/2016 09:13:40 Matthew Mayer  ([id number]) -------------------------------------------------------------------------------- Encounter Discharge Information Details Patient Name: Matthew Mayer Date of Service: 08/07/2016 8:00 AM Medical Record Number: [id number] Patient Account Number: [id number] Date of Birth/Sex: 30-Dec-1960 (55 y.o. Male) Treating RN: Curtis Sites Primary Care Physician: Maudie Flakes Other Clinician: Referring Physician: Maudie Flakes Treating Physician/Extender: Altamese [place] in Treatment: 0 Encounter Discharge Information Items Discharge Pain Level: 0 Discharge Condition: Stable Ambulatory Status: Ambulatory Discharge Destination: Home Transportation: Private Auto Accompanied By: spouse Schedule Follow-up Appointment: Yes Medication Reconciliation completed and provided to Patient/Care No [place]: Provided on Clinical Summary of Care: 08/07/2016 Form Type Recipient Paper Patient DT Electronic Signature(s) Signed: 08/07/2016 9:14:22 AM By: Curtis Sites Previous Signature: 08/07/2016 9:08:41 AM Version By: Gwenlyn Perking Entered By: Curtis Sites on 08/07/2016 09:14:22 Matthew Mayer ([id number]) -------------------------------------------------------------------------------- Multi Wound Chart Details Patient Name: Matthew Mayer Date of Service: 08/07/2016 8:00 AM Medical Record Number: [id number] Patient Account Number: [id number] Date of Birth/Sex: [birth year] (55 y.o. Male) Treating RN: Curtis Sites Primary Care Physician: Maudie Flakes Other Clinician: Referring Physician: Maudie Flakes Treating Physician/Extender: Maxwell Caul Weeks in Treatment: 0 Vital Signs Height(in): 67 Pulse(bpm): 70 Weight(lbs): 273 Blood Pressure 151/61 (mmHg): Body Mass Index(BMI): 43 Temperature(F): 98.4 Respiratory Rate 22 (breaths/min): Photos: [4:No Photos] [N/A:N/A] Wound Location: [4:Right Groin] [N/A:N/A] Wounding Event: [4:Surgical  Injury] [N/A:N/A] Primary Etiology: [4:Open Surgical Wound] [N/A:N/A] Comorbid History: [4:Chronic Obstructive Pulmonary Disease (COPD), Sleep Apnea, Congestive Heart Failure, Hypertension, Type II Diabetes, Neuropathy] [N/A:N/A] Date Acquired: [4:07/02/2016] [N/A:N/A] Weeks of Treatment: [4:0] [N/A:N/A] Wound Status: [4:Open] [N/A:N/A] Measurements L x W x D 1.7x0.9x0.1 [N/A:N/A] (cm) Area (cm) : [4:1.202] [N/A:N/A] Volume (cm) : [4:0.12] [N/A:N/A] Classification: [4:Partial Thickness] [N/A:N/A] HBO Classification: [4:Grade 1] [N/A:N/A] Exudate Amount: [4:Large] [N/A:N/A] Exudate Type: [4:Serous] [N/A:N/A] Exudate Color: [4:amber] [N/A:N/A] Wound Margin: [4:Flat and Intact] [N/A:N/A] Granulation Amount: [4:Medium (34-66%)] [N/A:N/A] Granulation Quality: [4:Red] [N/A:N/A] Necrotic Amount: [4:Medium (34-66%)] [N/A:N/A] Exposed Structures: [4:Fascia: No Fat: No Tendon: No Muscle: No] [N/A:N/A] Joint: No Bone: No Limited to Skin Breakdown  Epithelialization: None N/A N/A Periwound Skin Texture: Edema: No N/A N/A Excoriation: No Induration: No Callus: No Crepitus: No Fluctuance: No Friable: No Rash: No Scarring: No Periwound Skin Moist: Yes N/A N/A Moisture: Maceration: No Dry/Scaly: No Periwound Skin Color: Atrophie Blanche: No N/A N/A Cyanosis: No Ecchymosis: No Erythema: No Hemosiderin Staining: No Mottled: No Pallor: No Rubor: No Temperature: No Abnormality N/A N/A Tenderness on Yes N/A N/A Palpation: Wound Preparation: Ulcer Cleansing: N/A N/A Rinsed/Irrigated with Saline Topical Anesthetic Applied: Other: lidocaine 4% Treatment Notes Electronic Signature(s) Signed: 08/07/2016 4:35:41 PM By: Curtis Sitesorthy, Joanna Entered By: Curtis Sitesorthy, Joanna on 08/07/2016 08:57:48 Matthew OharaERRELL, Georgie W. ([id number]) -------------------------------------------------------------------------------- Multi-Disciplinary Care Plan Details Patient Name: Matthew OharaERRELL, Jakel W. Date of  Service: 08/07/2016 8:00 AM Medical Record Number: [id number] Patient Account Number: [id number] Date of Birth/Sex: Mar 25, 1961 (55 y.o. Male) Treating RN: Curtis Sitesorthy, Joanna Primary Care Physician: Maudie FlakesFeldpausch, Dale Other Clinician: Referring Physician: Maudie FlakesFeldpausch, Dale Treating Physician/Extender: Altamese CarolinaOBSON, MICHAEL G Weeks in Treatment: 0 Active Inactive Electronic Signature(s) Signed: 08/21/2016 12:02:32 PM By: Elliot GurneyWoody, RN, BSN, Kim RN, BSN Signed: 08/27/2016 5:33:12 PM By: Curtis Sitesorthy, Joanna Previous Signature: 08/07/2016 4:35:41 PM Version By: Curtis Sitesorthy, Joanna Entered By: Elliot GurneyWoody, RN, BSN, Kim on 08/21/2016 12:02:31 Matthew OharaERRELL, Nivek W. ([id number]) -------------------------------------------------------------------------------- Pain Assessment Details Patient Name: Matthew OharaERRELL, Hamish W. Date of Service: 08/07/2016 8:00 AM Medical Record Number: [id number] Patient Account Number: [id number] Date of Birth/Sex: Mar 25, 1961 14(55 y.o. Male) Treating RN: Curtis Sitesorthy, Joanna Primary Care Physician: Maudie FlakesFeldpausch, Dale Other Clinician: Referring Physician: Maudie FlakesFeldpausch, Dale Treating Physician/Extender: Maxwell CaulOBSON, MICHAEL G Weeks in Treatment: 0 Active Problems Location of Pain Severity and Description of Pain Patient Has Paino Yes Site Locations Pain Location: Pain in Ulcers With Dressing Change: Yes Duration of the Pain. Constant / Intermittento Intermittent Character of Pain Describe the Pain: Burning Pain Management and Medication Current Pain Management: Electronic Signature(s) Signed: 08/07/2016 4:35:41 PM By: Curtis Sitesorthy, Joanna Entered By: Curtis Sitesorthy, Joanna on 08/07/2016 08:17:53 Matthew OharaERRELL, Tyrees W. ([id number]) -------------------------------------------------------------------------------- Patient/Caregiver Education Details Patient Name: Matthew OharaERRELL, Percy W. Date of Service: 08/07/2016 8:00 AM Medical Record Number: [id number] Patient Account Number: [id number] Date of Birth/Gender: Mar 25, 1961 (55 y.o.  Male) Treating RN: Curtis Sitesorthy, Joanna Primary Care Physician: Maudie FlakesFeldpausch, Dale Other Clinician: Referring Physician: Maudie FlakesFeldpausch, Dale Treating Physician/Extender: Altamese CarolinaOBSON, MICHAEL G Weeks in Treatment: 0 Education Assessment Education Provided To: Patient and Caregiver Education Topics Provided Wound/Skin Impairment: Handouts: Other: wound care as ordered Methods: Demonstration, Explain/Verbal Responses: State content correctly Electronic Signature(s) Signed: 08/07/2016 4:35:41 PM By: Curtis Sitesorthy, Joanna Entered By: Curtis Sitesorthy, Joanna on 08/07/2016 09:14:38 Matthew OharaERRELL, Jarrick W. ([id number]) -------------------------------------------------------------------------------- Wound Assessment Details Patient Name: Matthew OharaERRELL, Jerol W. Date of Service: 08/07/2016 8:00 AM Medical Record Number: [id number] Patient Account Number: [id number] Date of Birth/Sex: Mar 25, 1961 22(55 y.o. Male) Treating RN: Curtis Sitesorthy, Joanna Primary Care Physician: Maudie FlakesFeldpausch, Dale Other Clinician: Referring Physician: Maudie FlakesFeldpausch, Dale Treating Physician/Extender: Maxwell CaulOBSON, MICHAEL G Weeks in Treatment: 0 Wound Status Wound Number: 4 Primary Open Surgical Wound Etiology: Wound Location: Right Groin Wound Open Wounding Event: Surgical Injury Status: Date Acquired: 07/02/2016 Comorbid Chronic Obstructive Pulmonary Disease Weeks Of Treatment: 0 History: (COPD), Sleep Apnea, Congestive Clustered Wound: No Heart Failure, Hypertension, Type II Diabetes, Neuropathy Wound Measurements Length: (cm) 1.7 Width: (cm) 0.9 Depth: (cm) 0.1 Area: (cm) 1.202 Volume: (cm) 0.12 % Reduction in Area: % Reduction in Volume: Epithelialization: None Tunneling: No Undermining: No Wound Description Classification: Partial Thickness Foul Odor Diabetic Severity [address](Wagner): Grade 1 Wound Margin: Flat and Intact Exudate Amount: Large Exudate Type: Serous Exudate Color: amber After Cleansing: No Wound Bed Granulation Amount: Medium (34-66%)  Exposed Structure Granulation Quality: Red Fascia Exposed: No Necrotic Amount: Medium (34-66%) Fat Layer Exposed: No Necrotic Quality: Adherent Slough Tendon Exposed: No Muscle Exposed: No Joint Exposed: No Bone Exposed: No Limited to Skin Breakdown Periwound Skin Texture Texture Color No Abnormalities Noted: No No Abnormalities Noted: No Callus: No Atrophie Blanche: No Matthew OharaERRELL, Kaidan W. ([id number]) Crepitus: No Cyanosis: No Excoriation: No Ecchymosis: No Fluctuance: No Erythema: No Friable: No Hemosiderin Staining: No Induration: No Mottled: No Localized Edema: No Pallor: No Rash: No Rubor: No Scarring: No Temperature / Pain Moisture Temperature: No Abnormality No Abnormalities Noted: No Tenderness on Palpation: Yes Dry / Scaly: No Maceration: No Moist: Yes Wound Preparation Ulcer Cleansing: Rinsed/Irrigated with Saline Topical Anesthetic Applied: Other: lidocaine 4%, Electronic Signature(s) Signed: 08/07/2016 4:35:41 PM By: Curtis Sitesorthy, Joanna Entered By: Curtis Sitesorthy, Joanna on 08/07/2016 08:34:45 Matthew OharaERRELL, Lejuan W. ([id number]) -------------------------------------------------------------------------------- Vitals Details Patient Name: Matthew OharaERRELL, Orell W. Date of Service: 08/07/2016 8:00 AM Medical Record Number: [id number] Patient Account Number: [id number] Date of Birth/Sex: 04-21-1961 (55 y.o. Male) Treating RN: Curtis Sitesorthy, Joanna Primary Care Physician: Maudie FlakesFeldpausch, Dale Other Clinician: Referring Physician: Maudie FlakesFeldpausch, Dale Treating Physician/Extender: Altamese CarolinaOBSON, MICHAEL G Weeks in Treatment: 0 Vital Signs Time Taken: 08:17 Temperature (F): 98.4 Height (in): 67 Pulse (bpm): 70 Source: Stated Respiratory Rate (breaths/min): 22 Weight (lbs): 273 Blood Pressure (mmHg): 151/61 Source: Stated Reference Range: 80 - 120 mg / dl Body Mass Index (BMI): 42.8 Electronic Signature(s) Signed: 08/07/2016 4:35:41 PM By: Curtis Sitesorthy, Joanna Entered By: Curtis Sitesorthy, Joanna on  08/07/2016 08:18:31

## 2016-08-08 NOTE — Progress Notes (Addendum)
Matthew Mayer, Iliya W. ([id number]) Visit Report for 08/07/2016 Chief Complaint Document Details Patient Name: Matthew Mayer, Matthew W. Date of Service: 08/07/2016 8:00 AM Medical Record Patient Account Number: [id number] Number: Treating RN: Curtis SitesDorthy, Joanna 04/01/1961 (55 y.o. 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 New scrotal ulcer on left. 08/07/16; patient is here for review of a wound in his right inguinal area Electronic Signature(s) Signed: 08/07/2016 4:42:06 PM By: Baltazar Najjarobson, Michael MD Entered By: Baltazar Najjarobson, Michael on 08/07/2016 09:11:33 Matthew Mayer, Matthew W. ([id number]) -------------------------------------------------------------------------------- HPI Details Patient Name: Matthew Mayer, Matthew W. Date of Service: 08/07/2016 8:00 AM Medical Record Patient Account Number: [id number] Number: Treating RN: Curtis SitesDorthy, Joanna 04/01/1961 (55 y.o. Other Clinician: Date of Birth/Sex: Male) Treating ROBSON, MICHAEL Primary Care Physician: Maudie FlakesFeldpausch, Dale Physician/Extender: G Referring Physician: Kandis CockingFeldpausch, Dale Weeks in Treatment: 0 History of Present Illness Location: Patient has a ulcer to Sacrum HPI Description: Pleasant 55 year old with history of type 2 diabetes (hemoglobin A1c 11.5 in November 2016), COPD, congestive heart failure, chronic kidney disease, and obesity. Seen previously in the wound clinic for a sacral pressure ulcer, which remains healed (since January 2015). He developed an ulceration on the right side of his scrotum over a year ago which has not improved with multiple antibiotics and topical antifungal treatment. Biopsy cultured 08/25/2015 grew normal flora. Performing dressing changes with Prisma with progressive  improvement. He returns to clinic for follow-up and says that both scrotal ulcerations have healed. No drainage. No significant pain. READMISSION 08/07/16; this is a [age] year old man who ICU was previously in this clinic under the care of Dr. Lawerance BachBurns although I was not involved with his care at that time. He was apparently treated for wounds which are classified as pressure areas on his scrotum. 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 He spent a difficult hospitalization [facility] from 06/22/16 through 07/17/16. This was secondary to acute pancreatitis felt to be related to hypertriglyceridemia. During this hospitalization he required an extensive stay in the medical ICU and required mechanical ventilation. He was on ECMO for 5 days roughly from 10/5 through 10/10 per his wife. The incision site was in his right groin. 2 weeks ago the sutures were removed and the wound dehisced the. They have been using leftover Prisma they have during his stay in this clinic in late 2016 after cleaning the wound with hydrogen peroxide Electronic Signature(s) Signed: 08/07/2016 4:42:06 PM By: Baltazar Najjarobson, Michael MD Entered By: Baltazar Najjarobson, Michael on 08/07/2016 09:15:10 Matthew Mayer, Matthew W. ([id number]) -------------------------------------------------------------------------------- Physical Exam Details Patient Name: Matthew Mayer, Matthew W. Date of Service: 08/07/2016 8:00 AM Medical Record Patient Account Number: [id number] Number: Treating RN: Curtis SitesDorthy, Joanna 04/01/1961 (55 y.o. Other Clinician: Date of Birth/Sex: Male) Treating ROBSON, MICHAEL Primary Care Physician: Maudie FlakesFeldpausch, Dale Physician/Extender: G Referring Physician: Kandis CockingFeldpausch, Dale Weeks in Treatment: 0 Constitutional Patient is hypertensive.Marland Kitchen. Respirations regular, non-labored and within target range.. Temperature is  normal and within the target range for the patient.. Patient has a tracheostomy in place connected to oxygen. Nevertheless his respiratory rate and work of breathing are normal. Eyes Conjunctivae clear. No discharge. No scleral icterus. Ears, Nose, Mouth, and Throat Oropharynx within normal limits, without erythema, exudate or ulceration.. Neck Tracheostomy in place. Respiratory Respiratory effort is easy and symmetric  bilaterally. Rate is normal at rest and on room air.. Slightly reduced air entry but area entry otherwise surprisingly good no wheezing no prolonged expiratory phase. Cardiovascular Heart rhythm and rate regular, without murmur or gallop. No elevated JVP. Pedal pulses palpable and strong bilaterally.. Gastrointestinal (GI) Obese, some tenderness to deep palpation in the right mid abdoman. No liver or spleen enlargement or tenderness.. Lymphatic Nonpalpable in the inguinal area. Integumentary (Hair, Skin) There is no rash no abdominal bruising. Psychiatric No evidence of depression, anxiety, or agitation. Calm, cooperative, and communicative. Appropriate interactions and affect.. Notes Wound exam; the wound is in the right inguinal area where presumably they catheterize the right femoral artery. The wound itself is small and healthy-looking with good granulation. In fact there is some islands of epithelialization in the middle of this. No evidence of surrounding infection no tenderness Electronic Signature(s) Signed: 08/07/2016 4:42:06 PM By: Baltazar Najjar MD FILOMENO, CROMLEY ([id number]) Entered By: Baltazar Najjar on 08/07/2016 09:29:56 Matthew Mayer ([id number]) -------------------------------------------------------------------------------- Physician Orders Details Patient Name: Matthew Mayer Date of Service: 08/07/2016 8:00 AM Medical Record Patient Account Number: [id number] Number: Treating RN: Curtis Sites Jan 26, 1961 (55 y.o. Other  Clinician: Date of Birth/Sex: Male) Treating ROBSON, MICHAEL Primary Care Physician: Maudie Flakes Physician/Extender: G Referring Physician: Kandis Cocking in Treatment: 0 Verbal / Phone Orders: Yes Clinician: Curtis Sites Read Back and Verified: Yes Diagnosis Coding Wound Cleansing Wound #4 Right Groin o Clean wound with Normal Saline. o May Shower, gently pat wound dry prior to applying new dressing. Anesthetic Wound #4 Right Groin o Topical Lidocaine 4% cream applied to wound bed prior to debridement Primary Wound Dressing Wound #4 Right Groin o Promogran o Other: - coverlet or bandaid Dressing Change Frequency Wound #4 Right Groin o Change dressing every other day. Follow-up Appointments Wound #4 Right Groin o Return Appointment in 1 week. Electronic Signature(s) Signed: 08/07/2016 4:35:41 PM By: Curtis Sites Signed: 08/07/2016 4:42:06 PM By: Baltazar Najjar MD Entered By: Curtis Sites on 08/07/2016 09:03:02 Matthew Mayer ([id number]) -------------------------------------------------------------------------------- Problem List Details Patient Name: Matthew Mayer Date of Service: 08/07/2016 8:00 AM Medical Record Patient Account Number: [id number] Number: Treating RN: Curtis Sites 1961-03-03 (55 y.o. Other Clinician: Date of Birth/Sex: Male) Treating ROBSON, MICHAEL Primary Care Physician: Maudie Flakes Physician/Extender: G Referring Physician: Kandis Cocking in Treatment: 0 Active Problems ICD-10 Encounter Code Description Active Date Diagnosis L97.111 Non-pressure chronic ulcer of right thigh limited to 08/07/2016 Yes breakdown of skin T81.31XD Disruption of external operation (surgical) wound, not 08/07/2016 Yes elsewhere classified, subsequent encounter Inactive Problems Resolved Problems Electronic Signature(s) Signed: 08/07/2016 4:42:06 PM By: Baltazar Najjar MD Entered By: Baltazar Najjar on 08/07/2016 09:10:59 Matthew Mayer ([id number]) -------------------------------------------------------------------------------- Progress Note Details Patient Name: Matthew Mayer Date of Service: 08/07/2016 8:00 AM Medical Record Patient Account Number: [id number] Number: Treating RN: Curtis Sites [birth year] (55 y.o. 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 New scrotal ulcer on left. 08/07/16; patient is here for review of a wound in his right inguinal area History of Present Illness (HPI) The following HPI elements were documented for the patient's wound: Location: Patient has a ulcer to Sacrum Pleasant 55 year old with history of type 2 diabetes (hemoglobin A1c 11.5 in November 2016), COPD, congestive heart failure, chronic kidney disease, and obesity. Seen previously in  the wound clinic for a sacral pressure ulcer, which remains healed (since January 2015). He developed an ulceration on the right side of his scrotum over a year ago which has not improved with multiple antibiotics and topical antifungal treatment. Biopsy cultured 08/25/2015 grew normal flora. Performing dressing changes with Prisma with progressive improvement. He returns to clinic for follow-up and says that both scrotal ulcerations have healed. No drainage. No significant pain. READMISSION 08/07/16; this is a 55 year old man who ICU was previously in this clinic under the care of Dr. Lawerance Bach although I was not involved with his care at that time. He was apparently treated for wounds which are classified as pressure areas on his scrotum. 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 He spent a difficult hospitalization [facility] from 06/22/16 through 07/17/16. This was secondary to acute pancreatitis felt to be related to hypertriglyceridemia. During this hospitalization he required an extensive stay in the medical ICU and required mechanical ventilation. He was on ECMO for 5 days roughly from 10/5 through 10/10 per his wife. The incision site was in his right groin. 2 weeks ago the sutures were removed and the wound dehisced the. They have been using leftover Prisma they have during his stay in this clinic in late 2016 after cleaning the wound with hydrogen peroxide Wound History Patient presents with 1 open wound that has been present for approximately mid October. Patient has been treating wound in the following manner: peroxide, prisma and bandaid. Laboratory tests have been performed in the last month. Patient reportedly has tested positive for an antibiotic resistant organism. Matthew Mayer, Matthew Mayer ([id number]) Patient reportedly has not tested positive for osteomyelitis. Patient reportedly has not had testing performed to evaluate circulation in the legs. Patient experiences the following problems associated with their wounds: swelling. Patient History Information obtained from Patient. Allergies Penicillins (Severity: Severe, Reaction: anaphalyxis), Augmentin, codeine, clarithromycin Family History Diabetes - Paternal Grandparents, Father, Siblings, Hypertension - Mother, Father, Siblings, Lung Disease - Siblings, Stroke - Father, Siblings,. Social History Former smoker, Marital Status - Married, Alcohol Use - Rarely, Drug Use - No History, Caffeine Use - Daily. Medical History Respiratory Patient has history of Chronic Obstructive Pulmonary Disease (COPD), Sleep Apnea Cardiovascular Patient has history of Congestive Heart Failure, Hypertension Endocrine Patient has history of Type II Diabetes - with  CKD Denies history of Type I Diabetes Neurologic Patient has history of Neuropathy Oncologic Denies history of Received Chemotherapy, Received Radiation Patient is treated with Insulin. Blood sugar is tested. Blood sugar results noted at the following times: Breakfast - 105. Hospitalization/Surgery History - 04/30/2013, [facility], gallbadder. - 06/22/2016, [facility], pancreatitis. Medical And Surgical History Notes Constitutional Symptoms (General Health) 04/30/2013 gall badder surgery, couldn't breath on his own, CCU, trach 14 days into it. Transferred to [facility] fro rehab. Home 2 weeks. 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 Eyes The patient has no complaints or symptoms. Ear/Nose/Mouth/Throat Matthew Mayer ([id number]) The patient has no complaints or symptoms. Hematologic/Lymphatic The patient has no complaints or symptoms. Genitourinary Complains or has symptoms of Kidney failure/ Dialysis - CKD stage 3. Immunological The patient has no complaints or symptoms. Integumentary (Skin) The patient has no complaints or symptoms. Musculoskeletal The patient has no complaints or symptoms. Objective Constitutional Patient is hypertensive.Marland Kitchen Respirations regular, non-labored and within target range.Marland Kitchen  Temperature is normal and within the target range for the patient.. Patient has a tracheostomy in place connected to oxygen. Nevertheless his respiratory rate and work of breathing are normal. Vitals Time Taken: 8:17 AM, Height: 67 in, Source: Stated, Weight: 273 lbs, Source: Stated, BMI: 42.8, Temperature: 98.4 F, Pulse: 70 bpm, Respiratory Rate: 22 breaths/min, Blood Pressure: 151/61 mmHg. Eyes Conjunctivae clear. No discharge. No scleral icterus. Ears, Nose, Mouth, and Throat Oropharynx within normal limits, without erythema, exudate or  ulceration.. Neck Tracheostomy in place. Respiratory Respiratory effort is easy and symmetric bilaterally. Rate is normal at rest and on room air.. Slightly reduced air entry but area entry otherwise surprisingly good no wheezing no prolonged expiratory phase. Cardiovascular Heart rhythm and rate regular, without murmur or gallop. No elevated JVP. Pedal pulses palpable and strong bilaterally.. Gastrointestinal (GI) Obese, some tenderness to deep palpation in the right mid abdoman. No liver or spleen enlargement or tenderness.. Lymphatic Matthew Mayer, Matthew Mayer. ([id number]) Nonpalpable in the inguinal area. Psychiatric No evidence of depression, anxiety, or agitation. Calm, cooperative, and communicative. Appropriate interactions and affect.. General Notes: Wound exam; the wound is in the right inguinal area where presumably they catheterize the right femoral artery. The wound itself is small and healthy-looking with good granulation. In fact there is some islands of epithelialization in the middle of this. No evidence of surrounding infection no tenderness Integumentary (Hair, Skin) There is no rash no abdominal bruising. Wound #4 status is Open. Original cause of wound was Surgical Injury. The wound is located on the Right Groin. The wound measures 1.7cm length x 0.9cm width x 0.1cm depth; 1.202cm^2 area and 0.12cm^3 volume. The wound is limited to skin breakdown. There is no tunneling or undermining noted. There is a large amount of serous drainage noted. The wound margin is flat and intact. There is medium (34-66%) red granulation within the wound bed. There is a medium (34-66%) amount of necrotic tissue within the wound bed including Adherent Slough. The periwound skin appearance exhibited: Moist. The periwound skin appearance did not exhibit: Callus, Crepitus, Excoriation, Fluctuance, Friable, Induration, Localized Edema, Rash, Scarring, Dry/Scaly, Maceration, Atrophie Blanche, Cyanosis,  Ecchymosis, Hemosiderin Staining, Mottled, Pallor, Rubor, Erythema. Periwound temperature was noted as No Abnormality. The periwound has tenderness on palpation. Assessment Active Problems ICD-10 L97.111 - Non-pressure chronic ulcer of right thigh limited to breakdown of skin T81.31XD - Disruption of external operation (surgical) wound, not elsewhere classified, subsequent encounter Plan Wound Cleansing: Wound #4 Right Groin: Clean wound with Normal Saline. May Shower, gently pat wound dry prior to applying new dressing. Anesthetic: Wound #4 Right Groin: INRI, SOBIESKI. ([id number]) Topical Lidocaine 4% cream applied to wound bed prior to debridement Primary Wound Dressing: Wound #4 Right Groin: Promogran Other: - coverlet or bandaid Dressing Change Frequency: Wound #4 Right Groin: Change dressing every other day. Follow-up Appointments: Wound #4 Right Groin: Return Appointment in 1 week. #1 I think collagen is the correct dressing here covered with a foam change every 2 days. I have advised them not to use peroxide to clean the wound. #2 acute pancreatitis in the hospital he apparently still has some form of cyst/mass for which she is going back in December for an ERCP #3 he has a chronic tracheostomy with oxygen override think his respiratory status appears stable today. Electronic Signature(s) Signed: 08/08/2016 9:15:44 AM By: Elliot Gurney RN, BSN, Kim RN, BSN Signed: 08/08/2016 4:57:31 PM By: Baltazar Najjar MD Previous Signature: 08/07/2016 4:42:06 PM Version By: Baltazar Najjar MD Entered By: Elliot Gurney,  RN, BSN, Kim on 08/08/2016 09:15:44 Matthew Mayer ([id number]) -------------------------------------------------------------------------------- ROS/PFSH Details Patient Name: Matthew Mayer Date of Service: 08/07/2016 8:00 AM Medical Record Patient Account Number: [id number] Number: Treating RN: Curtis Sites Aug 31, 1961 (55 y.o. Other Clinician: Date of  Birth/Sex: Male) Treating ROBSON, MICHAEL Primary Care Physician: Maudie Flakes Physician/Extender: G Referring Physician: Kandis Cocking in Treatment: 0 Label Progress Note Print Version as History and Physical for this encounter Information Obtained From Patient Wound History Do you currently have one or more open woundso Yes How many open wounds do you currently haveo 1 Approximately how long have you had your woundso mid October How have you been treating your wound(s) until nowo peroxide, prisma and bandaid Has your wound(s) ever healed and then re-openedo No Have you had any lab work done in the past montho Yes Who ordered the lab work doneo PCP Have you tested positive for osteomyelitis (bone infection)o No Have you had any tests for circulation on your legso No Have you had other problems associated with your woundso Swelling Genitourinary Complaints and Symptoms: Positive for: Kidney failure/ Dialysis - CKD stage 3 Constitutional Symptoms (General Health) Complaints and Symptoms: No Complaints or Symptoms Medical History: Past Medical History Notes: 04/30/2013 gall badder surgery, couldn't breath on his own, CCU, trach 14 days into it. Transferred to [facility] fro rehab. Home 2 weeks. Eyes Complaints and Symptoms: No Complaints or Symptoms Ear/Nose/Mouth/Throat Complaints and Symptoms: No Complaints or Symptoms Matthew Mayer, [place]. ([id number]) Medical History: Past Medical History Notes: larynx stenosis Hematologic/Lymphatic Complaints and Symptoms: No Complaints or Symptoms Respiratory Medical History: Positive for: Chronic Obstructive Pulmonary Disease (COPD); Sleep Apnea Past Medical History Notes: tracheostomy Cardiovascular Medical History: Positive for: Congestive Heart Failure; Hypertension Endocrine Medical History: Positive for: Type II Diabetes - with CKD Negative for: Type I Diabetes Time with diabetes: 20 years Treated with:  Insulin Blood sugar tested every day: Yes Tested : 3 times daily Blood sugar testing results: Breakfast: 105 Immunological Complaints and Symptoms: No Complaints or Symptoms Integumentary (Skin) Complaints and Symptoms: No Complaints or Symptoms Musculoskeletal Complaints and Symptoms: No Complaints or Symptoms Neurologic Medical History: Positive for: Neuropathy RONDEL, EPISCOPO ([id number]) Oncologic Medical History: Negative for: Received Chemotherapy; Received Radiation Immunizations Pneumococcal Vaccine: Received Pneumococcal Vaccination: Yes Immunization Notes: up to date Hospitalization / Surgery History Name of Hospital Purpose of Hospitalization/Surgery Date [facility] gallbadder 04/30/2013 [facility] pancreatitis 06/22/2016 Family and Social History Diabetes: Yes - Paternal Grandparents, Father, Siblings; Hypertension: Yes - Mother, Father, Siblings; Lung Disease: Yes - Siblings; Stroke: Yes - Father, Siblings,; Former smoker; Marital Status - Married; Alcohol Use: Rarely; Drug Use: No History; Caffeine Use: Daily; Financial Concerns: Yes; Food, Clothing or Shelter Needs: No; Support System Lacking: No; Transportation Concerns: No; Advanced Directives: No; Patient does not want information on Advanced Directives; Living Will: No; Medical Power of Attorney: No Electronic Signature(s) Signed: 08/07/2016 4:35:41 PM By: Curtis Sites Signed: 08/07/2016 4:42:06 PM By: Baltazar Najjar MD Entered By: Curtis Sites on 08/07/2016 08:23:01 Matthew Mayer ([id number]) -------------------------------------------------------------------------------- SuperBill Details Patient Name: Matthew Mayer Date of Service: 08/07/2016 Medical Record Patient Account Number: [id number] Number: Treating RN: Curtis Sites 1961/06/28 (55 y.o. Other Clinician: Date of Birth/Sex: Male) Treating ROBSON, MICHAEL Primary Care Physician: Maudie Flakes Physician/Extender:  G Referring Physician: Kandis Cocking in Treatment: 0 Diagnosis Coding ICD-10 Codes Code Description L97.111 Non-pressure chronic ulcer of right thigh limited to breakdown of skin Disruption of external operation (surgical) wound, not elsewhere classified, subsequent T81.31XD encounter  Facility Procedures CPT4 Code: [id number] Description: 99213 - WOUND CARE VISIT-LEV 3 EST PT Modifier: Quantity: 1 Physician Procedures CPT4: Description Modifier Quantity Code [id number] WC PHYS LEVEL 3 o NEW PT 1 ICD-10 Description Diagnosis T81.31XD Disruption of external operation (surgical) wound, not elsewhere classified, subsequent encounter L97.111 Non-pressure chronic ulcer of  right thigh limited to breakdown of skin Electronic Signature(s) Signed: 08/07/2016 4:42:06 PM By: Baltazar Najjarobson, Michael MD Entered By: Baltazar Najjarobson, Michael on 08/07/2016 09:27:38

## 2016-08-14 ENCOUNTER — Ambulatory Visit: Payer: BLUE CROSS/BLUE SHIELD | Admitting: Nurse Practitioner

## 2017-01-27 DIAGNOSIS — E781 Pure hyperglyceridemia: Secondary | ICD-10-CM | POA: Insufficient documentation

## 2018-01-28 ENCOUNTER — Encounter (INDEPENDENT_AMBULATORY_CARE_PROVIDER_SITE_OTHER): Payer: BLUE CROSS/BLUE SHIELD | Admitting: Vascular Surgery

## 2018-01-28 ENCOUNTER — Other Ambulatory Visit (INDEPENDENT_AMBULATORY_CARE_PROVIDER_SITE_OTHER): Payer: BLUE CROSS/BLUE SHIELD

## 2018-01-28 ENCOUNTER — Encounter (INDEPENDENT_AMBULATORY_CARE_PROVIDER_SITE_OTHER): Payer: BLUE CROSS/BLUE SHIELD

## 2018-02-10 ENCOUNTER — Ambulatory Visit (INDEPENDENT_AMBULATORY_CARE_PROVIDER_SITE_OTHER): Payer: BLUE CROSS/BLUE SHIELD | Admitting: Gastroenterology

## 2018-02-10 ENCOUNTER — Encounter (INDEPENDENT_AMBULATORY_CARE_PROVIDER_SITE_OTHER): Payer: Self-pay

## 2018-02-10 ENCOUNTER — Encounter: Payer: Self-pay | Admitting: Gastroenterology

## 2018-02-10 ENCOUNTER — Other Ambulatory Visit: Payer: Self-pay

## 2018-02-10 VITALS — BP 135/63 | HR 80 | Ht 66.0 in | Wt 276.5 lb

## 2018-02-10 DIAGNOSIS — R140 Abdominal distension (gaseous): Secondary | ICD-10-CM | POA: Diagnosis not present

## 2018-02-10 DIAGNOSIS — R6881 Early satiety: Secondary | ICD-10-CM

## 2018-02-10 NOTE — Progress Notes (Signed)
Gastroenterology Consultation  Referring Provider:     Marina Goodell, MD Primary Care Physician:  Marina Goodell, MD Primary Gastroenterologist:  Dr. Servando Snare     Reason for Consultation:     Early satiety with bloating        HPI:   Matthew Mayer is a 57 y.o. y/o male referred for consultation & management of early satiety with bloating by Dr. Maryjane Hurter, Madaline Guthrie, MD.  This patient comes in today after being referred by his primary care provider for early satiety and bloating with burping.  The patient has a history of being seen in 2008 by Dr. Henrene Hawking. In September 2017 the patient was admitted to the hospital for pancreatitis and underwent a endoscopic ultrasound in July 2018. The patient also has multiple visits to his primary care provider for reflux symptoms. The patient was recently admitted to the hospital again with a dilated left hepatic duct.  The patient had an EUS and ERCP with a hepato-gastrostomy and placement of a metal biliary stent.  The ERCP was then done right after the procedure and a plastic stent was placed within that period the patient reports that his procedure was complicated by dislodging of his trach and he is very apprehensive about undergoing any further procedures. The patient now reports that his early satiety is accompanied by abdominal bloating and a feeling of incomplete evacuation of his bowels. The patient reports that he has a tracheostomy as a complication from his gallbladder surgery in 2014.  Past Medical History:  Diagnosis Date  . COPD (chronic obstructive pulmonary disease) (HCC)   . Diabetes (HCC)   . Hyperlipidemia   . Hypertension   . Sleep apnea       Prior to Admission medications   Medication Sig Start Date End Date Taking? Authorizing Provider  albuterol (ACCUNEB) 1.25 MG/3ML nebulizer solution Inhale into the lungs. 05/27/14   [provider]  albuterol-ipratropium (COMBIVENT) 18-103 MCG/ACT inhaler Inhale 2 puffs into  the lungs every 6 (six) hours as needed for wheezing or shortness of breath.    [provider]  arformoterol (BROVANA) 15 MCG/2ML NEBU Inhale into the lungs. 07/18/16   [provider]  aspirin 81 MG tablet Take 81 mg by mouth daily.    [provider]  atorvastatin (LIPITOR) 10 MG tablet Take 10 mg by mouth daily.    [provider]  carvedilol (COREG) 6.25 MG tablet TK 1 T PO  BID 12/20/17   [provider]  Continuous Blood Gluc Sensor (FREESTYLE LIBRE SENSOR SYSTEM) MISC USE 1 EACH EVERY 10 (TEN) DAYS E11.65 02/05/18   [provider]  cyclobenzaprine (FLEXERIL) 10 MG tablet TK 1 T PO NIGHTLY PRN FOR MUSCLE SPASMS 12/09/17   [provider]  dexamethasone (DECADRON) 1 MG tablet Take 0.5 mg by mouth daily with breakfast.     [provider]  docusate sodium (COLACE) 100 MG capsule Take by mouth.    [provider]  DULoxetine (CYMBALTA) 60 MG capsule Take 60 mg by mouth daily.    [provider]  esomeprazole (NEXIUM) 40 MG capsule Take 40 mg by mouth daily at 12 noon.    [provider]  fenofibrate (TRICOR) 145 MG tablet  12/06/17   [provider]  fentaNYL (DURAGESIC - DOSED MCG/HR) 25 MCG/HR patch Place 25 mcg onto the skin every 3 (three) days.    [provider]  furosemide (LASIX) 20 MG tablet Take 20 mg  by mouth daily.    [provider]  guaiFENesin (MUCINEX) 600 MG 12 hr tablet Take 1,200 mg by mouth 2 (two) times daily.    [provider]  HUMULIN R 500 UNIT/ML injection  01/14/18   [provider]  hydrocortisone valerate cream (WESTCORT) 0.2 % APPLY TO AFFECTED AREA EVERY DAY AS NEEDED 10/15/17   [provider]  insulin detemir (LEVEMIR) 100 UNIT/ML injection Inject 30 Units into the skin daily.    [provider]  insulin lispro (HUMALOG) 100 UNIT/ML injection Inject 20 Units into the skin 3 (three) times daily with meals.     [provider]  lisinopril (PRINIVIL,ZESTRIL) 20 MG tablet Take 20 mg by mouth daily.    [provider]  Multiple Vitamin (MULTIVITAMIN) tablet Take 1 tablet by mouth daily.    [provider]  Oxycodone HCl 10 MG TABS Take 10 mg by mouth daily as needed.    [provider]  pantoprazole (PROTONIX) 40 MG tablet TAKE ONE TABLET BY MOUTH TWICE DAILY 09/13/17   [provider]  PARoxetine (PAXIL) 20 MG tablet TK 1 T PO ONCE DAILY 01/24/18   [provider]  promethazine (PHENERGAN) 25 MG tablet TAKE 1 TABLET BY MOUTH EVERY 8 HOURS AS NEEDED FOR NAUSEA 01/30/17   [provider]  QUEtiapine (SEROQUEL) 25 MG tablet TK 1 T PO NIGHTLY 11/04/17   [provider]  rosuvastatin (CRESTOR) 20 MG tablet TK 1 T PO ONCE D 01/02/18   [provider]  sitaGLIPtin-metformin (JANUMET) 50-1000 MG per tablet Take 1 tablet by mouth daily.    [provider]  Vitamin D, Ergocalciferol, (DRISDOL) 50000 units CAPS capsule TK 1 C PO ONCE A MONTH 12/09/17   [provider]    Family History  Problem Relation Age of Onset  . Coronary artery disease Mother   . Hypertension Mother   . Osteoporosis Mother   . Heart attack Mother   . Diabetes Father   . Hypertension Father   . CVA Father   . Stroke Father   . Coronary artery disease Sister   . Diabetes Sister   . Hearing loss Sister   . Hyperlipidemia Sister   . Lung disease Sister   . Stroke Sister   . Colon polyps Brother   . Diabetes Brother   . Hearing loss Brother   . Hypertension Brother   . Cancer Maternal Grandmother   . Heart disease Maternal Grandfather   . Diabetes Paternal Grandmother   . Diabetes Paternal Grandfather      Social History   Tobacco Use  . Smoking status: Former Smoker    Packs/day: 3.00    Years: 38.00    Pack years: 114.00    Types: Cigarettes    Last attempt to quit: 04/30/2013    Years since quitting: 4.7  . Smokeless tobacco:  Never Used  Substance Use Topics  . Alcohol use: Yes    Comment: occasional beer  . Drug use: No    Allergies as of 02/10/2018 - Review Complete 02/10/2018  Allergen Reaction Noted  . Augmentin [amoxicillin-pot clavulanate]  07/28/2013  . Biaxin [clarithromycin]  07/28/2013  . Codeine  07/28/2013  . Penicillins  07/28/2013  . Percocet [oxycodone-acetaminophen]  07/28/2013    Review of Systems:    All systems reviewed and negative except where noted in HPI.   Physical Exam:  BP 135/63   Pulse 80   Ht  (1.676 m)  Wt 276 lb 8 oz (125.4 kg)   BMI 44.63 kg/m  No LMP for male patient. Psych:  Alert and cooperative. Normal mood and affect. General:   Alert,  Well-developed, well-nourished, pleasant and cooperative in NAD Head:  Normocephalic and atraumatic. Eyes:  Sclera clear, no icterus.   Conjunctiva pink. Ears:  Normal auditory acuity. Nose:  No deformity, discharge, or lesions. Mouth:  No deformity or lesions,oropharynx pink & moist. Neck:  Supple; no masses or thyromegaly. Lungs:  The patient has a tracheostomy and decreased breath sounds Heart:  Regular rate and rhythm; no murmurs, clicks, rubs, or gallops. Abdomen:  Normal bowel sounds.  No bruits.  Soft, non-tender and non-distended without masses, hepatosplenomegaly or hernias noted.  No guarding or rebound tenderness.  Negative Carnett sign.   Rectal:  Deferred. Msk:  Symmetrical without gross deformities.  Good, equal movement & strength bilaterally. Pulses:  Normal pulses noted. Extremities:  No clubbing or edema.  No cyanosis. Neurologic:  Alert and oriented x3;  grossly normal neurologically. Skin:  Intact without significant lesions or rashes.  No jaundice. Lymph Nodes:  No significant cervical adenopathy. Psych:  Alert and cooperative. Normal mood and affect.  Imaging Studies: No results found.  Assessment and Plan:   Matthew Mayer is a 57 y.o. y/o male comes in today with a history of reflux and  reports early satiety with bloating and burping.  The patient has a history of pancreatitis with changes on the endoscopic ultrasound consistent with chronic pancreatitis. The patient now has early satiety with bloating and a feeling of incomplete evacuation.  The patient is very apprehensive about undergoing any further endoscopic interventions.  The patient has been given an option of having his procedures at [facility] but he does not want to go back there unless he has to.  The patient has been told that he has to go back there for the removal of the stent and can see if they are willing to do his EGD and colonoscopy the same time.  The patient states he would rather have his EGD and colonoscopy with me and the ERCP at [facility].  He also reports that he will try to call them and see if they will do them all at the same time thereby avoiding being sedated twice.  If that does occur he states he will contact us to cancel the appointment.  The patient has been explained the plan and agrees with it.   Midge Minium, MD. Clementeen Graham   Note: This dictation was prepared with Dragon dictation along with smaller phrase technology. Any transcriptional errors that result from this process are unintentional.

## 2018-02-11 ENCOUNTER — Telehealth: Payer: Self-pay

## 2018-02-11 ENCOUNTER — Other Ambulatory Visit: Payer: Self-pay

## 2018-02-11 DIAGNOSIS — R140 Abdominal distension (gaseous): Secondary | ICD-10-CM

## 2018-02-11 DIAGNOSIS — R6881 Early satiety: Secondary | ICD-10-CM

## 2018-02-11 LAB — COMPREHENSIVE METABOLIC PANEL
A/G RATIO: 0.8 — AB (ref 1.2–2.2)
ALK PHOS: 199 IU/L — AB (ref 39–117)
ALT: 21 IU/L (ref 0–44)
AST: 24 IU/L (ref 0–40)
Albumin: 3 g/dL — ABNORMAL LOW (ref 3.5–5.5)
BUN/Creatinine Ratio: 11 (ref 9–20)
BUN: 43 mg/dL — ABNORMAL HIGH (ref 6–24)
Bilirubin Total: 0.5 mg/dL (ref 0.0–1.2)
CHLORIDE: 95 mmol/L — AB (ref 96–106)
CO2: 22 mmol/L (ref 20–29)
Calcium: 8.5 mg/dL — ABNORMAL LOW (ref 8.7–10.2)
Creatinine, Ser: 3.9 mg/dL — ABNORMAL HIGH (ref 0.76–1.27)
GFR calc non Af Amer: 16 mL/min/{1.73_m2} — ABNORMAL LOW (ref >59)
GFR, EST AFRICAN AMERICAN: 19 mL/min/{1.73_m2} — AB (ref >59)
GLUCOSE: 379 mg/dL — AB (ref 65–99)
Globulin, Total: 4 g/dL (ref 1.5–4.5)
POTASSIUM: 5.9 mmol/L — AB (ref 3.5–5.2)
Sodium: 132 mmol/L — ABNORMAL LOW (ref 134–144)
TOTAL PROTEIN: 7 g/dL (ref 6.0–8.5)

## 2018-02-11 NOTE — Telephone Encounter (Signed)
LVM for pt to return my call.

## 2018-02-11 NOTE — Telephone Encounter (Signed)
Pt's wife returned call and was notified of lab results. Pt's PCP has been notified as well.

## 2018-02-11 NOTE — Telephone Encounter (Signed)
-----   Message from Midge Minium, MD sent at 02/11/2018 11:17 AM EDT ----- Please contact patient's primary care provider and inform him that the patient's labs are drastically abnormal including his creatinine being very high and his potassium being high.  You can let the patient know that his liver enzymes are normal except for the alkaline phosphatase and that is elevated due to him having a stent in place.

## 2018-03-04 ENCOUNTER — Ambulatory Visit: Admit: 2018-03-04 | Payer: BLUE CROSS/BLUE SHIELD | Admitting: Gastroenterology

## 2018-03-04 SURGERY — COLONOSCOPY WITH PROPOFOL
Anesthesia: General

## 2018-03-18 ENCOUNTER — Ambulatory Visit: Payer: BLUE CROSS/BLUE SHIELD | Admitting: Gastroenterology

## 2018-03-25 ENCOUNTER — Encounter (INDEPENDENT_AMBULATORY_CARE_PROVIDER_SITE_OTHER): Payer: BLUE CROSS/BLUE SHIELD | Admitting: Vascular Surgery

## 2018-03-25 ENCOUNTER — Encounter (INDEPENDENT_AMBULATORY_CARE_PROVIDER_SITE_OTHER): Payer: BLUE CROSS/BLUE SHIELD

## 2018-03-25 ENCOUNTER — Other Ambulatory Visit (INDEPENDENT_AMBULATORY_CARE_PROVIDER_SITE_OTHER): Payer: BLUE CROSS/BLUE SHIELD

## 2018-03-31 ENCOUNTER — Encounter (INDEPENDENT_AMBULATORY_CARE_PROVIDER_SITE_OTHER): Payer: BLUE CROSS/BLUE SHIELD

## 2018-03-31 ENCOUNTER — Other Ambulatory Visit (INDEPENDENT_AMBULATORY_CARE_PROVIDER_SITE_OTHER): Payer: BLUE CROSS/BLUE SHIELD

## 2018-03-31 ENCOUNTER — Ambulatory Visit: Payer: BLUE CROSS/BLUE SHIELD | Admitting: Gastroenterology

## 2018-03-31 ENCOUNTER — Encounter (INDEPENDENT_AMBULATORY_CARE_PROVIDER_SITE_OTHER): Payer: BLUE CROSS/BLUE SHIELD | Admitting: Vascular Surgery

## 2018-06-23 ENCOUNTER — Encounter (INDEPENDENT_AMBULATORY_CARE_PROVIDER_SITE_OTHER): Payer: Self-pay | Admitting: Vascular Surgery

## 2018-06-23 ENCOUNTER — Encounter (INDEPENDENT_AMBULATORY_CARE_PROVIDER_SITE_OTHER): Payer: Self-pay

## 2018-06-23 ENCOUNTER — Encounter

## 2018-06-23 ENCOUNTER — Other Ambulatory Visit (INDEPENDENT_AMBULATORY_CARE_PROVIDER_SITE_OTHER): Payer: Self-pay | Admitting: Vascular Surgery

## 2018-06-23 ENCOUNTER — Other Ambulatory Visit (INDEPENDENT_AMBULATORY_CARE_PROVIDER_SITE_OTHER): Payer: BLUE CROSS/BLUE SHIELD

## 2018-06-23 ENCOUNTER — Ambulatory Visit (INDEPENDENT_AMBULATORY_CARE_PROVIDER_SITE_OTHER): Payer: BLUE CROSS/BLUE SHIELD | Admitting: Vascular Surgery

## 2018-06-23 VITALS — BP 110/55 | HR 79 | Resp 16 | Ht 66.0 in | Wt 263.6 lb

## 2018-06-23 DIAGNOSIS — N184 Chronic kidney disease, stage 4 (severe): Secondary | ICD-10-CM

## 2018-06-23 DIAGNOSIS — I10 Essential (primary) hypertension: Secondary | ICD-10-CM

## 2018-06-23 DIAGNOSIS — E1122 Type 2 diabetes mellitus with diabetic chronic kidney disease: Secondary | ICD-10-CM | POA: Diagnosis not present

## 2018-06-23 DIAGNOSIS — I132 Hypertensive heart and chronic kidney disease with heart failure and with stage 5 chronic kidney disease, or end stage renal disease: Secondary | ICD-10-CM

## 2018-06-23 DIAGNOSIS — Z794 Long term (current) use of insulin: Secondary | ICD-10-CM

## 2018-06-23 DIAGNOSIS — E781 Pure hyperglyceridemia: Secondary | ICD-10-CM

## 2018-06-23 DIAGNOSIS — I5032 Chronic diastolic (congestive) heart failure: Secondary | ICD-10-CM | POA: Diagnosis not present

## 2018-06-23 DIAGNOSIS — Z87891 Personal history of nicotine dependence: Secondary | ICD-10-CM

## 2018-06-23 NOTE — Progress Notes (Signed)
MRN : [id number]  Matthew Mayer is a 57 y.o. ([birth year]) male who presents with chief complaint of No chief complaint on file. Marland Kitchen  History of Present Illness: The patient is seen for evaluation for dialysis access. The patient has chronic renal insufficiency stage IV secondary to hypertension and diabetes. The patient's most recent creatinine clearance is less than 20. The patient volume status has not yet become an issue. Patient's blood pressures been relatively well controlled. There are mild uremic symptoms which appear to be relatively well tolerated at this time. The patient is right-handed.  The patient has been considering the various methods of dialysis and wishes to proceed with hemodialysis and therefore creation of AV access.  The patient denies amaurosis fugax or recent TIA symptoms. There are no recent neurological changes noted. The patient denies claudication symptoms or rest pain symptoms. The patient denies history of DVT, PE or superficial thrombophlebitis. The patient denies recent episodes of angina or shortness of breath.   No outpatient medications have been marked as taking for the 06/23/18 encounter (Appointment) with Gilda Crease, Latina Craver, MD.    Past Medical History:  Diagnosis Date  . COPD (chronic obstructive pulmonary disease) (HCC)   . Diabetes (HCC)   . Hyperlipidemia   . Hypertension   . Sleep apnea     Past Surgical History:  Procedure Laterality Date  . CHOLECYSTECTOMY    . EXPLORATORY LAPAROTOMY    . Hand ganglion cyst    . laryngoscopy w/excision &/or aspiration  12/04/2013  . TRACHEOSTOMY      Social History Social History   Tobacco Use  . Smoking status: Former Smoker    Packs/day: 3.00    Years: 38.00    Pack years: 114.00    Types: Cigarettes    Last attempt to quit: 04/30/2013    Years since quitting: 5.1  . Smokeless tobacco: Never Used  Substance Use Topics  . Alcohol use: Yes    Comment: occasional beer  . Drug use: No     Family History Family History  Problem Relation Age of Onset  . Coronary artery disease Mother   . Hypertension Mother   . Osteoporosis Mother   . Heart attack Mother   . Diabetes Father   . Hypertension Father   . CVA Father   . Stroke Father   . Coronary artery disease Sister   . Diabetes Sister   . Hearing loss Sister   . Hyperlipidemia Sister   . Lung disease Sister   . Stroke Sister   . Colon polyps Brother   . Diabetes Brother   . Hearing loss Brother   . Hypertension Brother   . Cancer Maternal Grandmother   . Heart disease Maternal Grandfather   . Diabetes Paternal Grandmother   . Diabetes Paternal Grandfather   No family history of bleeding/clotting disorders, porphyria or autoimmune disease   Allergies  Allergen Reactions  . Augmentin [Amoxicillin-Pot Clavulanate]   . Biaxin [Clarithromycin]   . Codeine   . Penicillins   . Percocet [Oxycodone-Acetaminophen]      REVIEW OF SYSTEMS (Negative unless checked)  Constitutional: [] Weight loss  [] Fever  [] Chills Cardiac: [] Chest pain   [] Chest pressure   [] Palpitations   [] Shortness of breath when laying flat   [] Shortness of breath with exertion. Vascular:  [] Pain in legs with walking   [] Pain in legs at rest  [] History of DVT   [] Phlebitis   [] Swelling in legs   [] Varicose veins   []   Non-healing ulcers Pulmonary:   [] Uses home oxygen   [] Productive cough   [] Hemoptysis   [] Wheeze  [] COPD   [] Asthma Neurologic:  [] Dizziness   [] Seizures   [] History of stroke   [] History of TIA  [] Aphasia   [] Vissual changes   [] Weakness or numbness in arm   [] Weakness or numbness in leg Musculoskeletal:   [] Joint swelling   [] Joint pain   [] Low back pain Hematologic:  [] Easy bruising  [] Easy bleeding   [] Hypercoagulable state   [] Anemic Gastrointestinal:  [] Diarrhea   [] Vomiting  [] Gastroesophageal reflux/heartburn   [] Difficulty swallowing. Genitourinary:  [x] Chronic kidney disease   [] Difficult urination  [] Frequent urination    [] Blood in urine Skin:  [] Rashes   [] Ulcers  Psychological:  [] History of anxiety   []  History of major depression.  Physical Examination  There were no vitals filed for this visit. There is no height or weight on file to calculate BMI. Gen: WD/WN, NAD Head: [place]/AT, No temporalis wasting.  Ear/Nose/Throat: Hearing grossly intact, nares w/o erythema or drainage, poor dentition Eyes: PER, EOMI, sclera nonicteric.  Neck: Supple, no masses.  No bruit or JVD.  Pulmonary:  Good air movement, clear to auscultation bilaterally, no use of accessory muscles.  Cardiac: RRR, normal S1, S2, no Murmurs. Vascular:  Vessel Right Left  Radial Palpable Palpable  Brachial Palpable Palpable  Gastrointestinal: soft, non-distended. No guarding/no peritoneal signs.  Musculoskeletal: M/S 5/5 throughout.  No deformity or atrophy.  Neurologic: CN 2-12 intact. Pain and light touch intact in extremities.  Symmetrical.  Speech is fluent. Motor exam as listed above. Psychiatric: Judgment intact, Mood & affect appropriate for pt's clinical situation. Dermatologic: No rashes or ulcers noted.  No changes consistent with cellulitis. Lymph : No Cervical lymphadenopathy, no lichenification or skin changes of chronic lymphedema.  CBC Lab Results  Component Value Date   WBC 10.6 05/24/2013   HGB 8.4 (L) 05/26/2013   HCT 25.1 (L) 05/24/2013   MCV 81 05/24/2013   PLT 244 05/24/2013    BMET    Component Value Date/Time   NA 132 (L) 02/10/2018 1625   NA 141 05/26/2013 0424   K 5.9 (H) 02/10/2018 1625   K 4.3 05/26/2013 0424   CL 95 (L) 02/10/2018 1625   CL 108 (H) 05/26/2013 0424   CO2 22 02/10/2018 1625   CO2 28 05/26/2013 0424   GLUCOSE 379 (H) 02/10/2018 1625   GLUCOSE 96 05/26/2013 0424   BUN 43 (H) 02/10/2018 1625   BUN 31 (H) 05/26/2013 0424   CREATININE 3.90 (H) 02/10/2018 1625   CREATININE 1.03 05/26/2013 0424   CALCIUM 8.5 (L) 02/10/2018 1625   CALCIUM 8.9 05/26/2013 0424   GFRNONAA 16 (L)  02/10/2018 1625   GFRNONAA >60 05/26/2013 0424   GFRAA 19 (L) 02/10/2018 1625   GFRAA >60 05/26/2013 0424   CrCl cannot be calculated (Patient's most recent lab result is older than the maximum 21 days allowed.).  COAG No results found for: INR, PROTIME  Radiology No results found.   Assessment/Plan 1. Stage 4 chronic kidney disease (HCC) Recommend:  At this time the patient does not have appropriate extremity access for dialysis  Patient should have a [place] fistula created.  The risks, benefits and alternative therapies were reviewed in detail with the patient.  All questions were answered.  The patient agrees to proceed with surgery.    2. Type 2 diabetes mellitus with stage 4 chronic kidney disease, with long-term current use of insulin (HCC) Continue hypoglycemic  medications as already ordered, these medications have been reviewed and there are no changes at this time.  Hgb A1C to be monitored as already arranged by primary service   3. Benign essential hypertension Continue antihypertensive medications as already ordered, these medications have been reviewed and there are no changes at this time.   4. Chronic diastolic CHF (congestive heart failure), NYHA class 3 (HCC) Continue cardiac and antihypertensive medications as already ordered and reviewed, no changes at this time.  Continue statin as ordered and reviewed, no changes at this time  Nitrates PRN for chest pain   5. Hypertriglyceridemia Continue statin as ordered and reviewed, no changes at this time   Levora Dredge, MD  06/23/2018 8:40 AM

## 2018-06-24 ENCOUNTER — Encounter (INDEPENDENT_AMBULATORY_CARE_PROVIDER_SITE_OTHER): Payer: Self-pay | Admitting: Vascular Surgery

## 2018-07-17 ENCOUNTER — Encounter (INDEPENDENT_AMBULATORY_CARE_PROVIDER_SITE_OTHER): Payer: Self-pay

## 2018-07-21 ENCOUNTER — Telehealth: Payer: Self-pay | Admitting: *Deleted

## 2018-07-21 ENCOUNTER — Inpatient Hospital Stay: Payer: BLUE CROSS/BLUE SHIELD | Attending: Oncology | Admitting: Oncology

## 2018-07-21 ENCOUNTER — Other Ambulatory Visit (INDEPENDENT_AMBULATORY_CARE_PROVIDER_SITE_OTHER): Payer: Self-pay | Admitting: Vascular Surgery

## 2018-07-21 ENCOUNTER — Encounter: Payer: Self-pay | Admitting: Oncology

## 2018-07-21 ENCOUNTER — Other Ambulatory Visit: Payer: Self-pay | Admitting: *Deleted

## 2018-07-21 VITALS — BP 127/72 | HR 63 | Temp 97.1°F | Resp 18 | Ht 66.0 in | Wt 260.8 lb

## 2018-07-21 DIAGNOSIS — G4730 Sleep apnea, unspecified: Secondary | ICD-10-CM

## 2018-07-21 DIAGNOSIS — E1122 Type 2 diabetes mellitus with diabetic chronic kidney disease: Secondary | ICD-10-CM

## 2018-07-21 DIAGNOSIS — Z9981 Dependence on supplemental oxygen: Secondary | ICD-10-CM | POA: Insufficient documentation

## 2018-07-21 DIAGNOSIS — Z930 Tracheostomy status: Secondary | ICD-10-CM | POA: Insufficient documentation

## 2018-07-21 DIAGNOSIS — Z794 Long term (current) use of insulin: Secondary | ICD-10-CM | POA: Insufficient documentation

## 2018-07-21 DIAGNOSIS — Z87891 Personal history of nicotine dependence: Secondary | ICD-10-CM | POA: Diagnosis not present

## 2018-07-21 DIAGNOSIS — Z809 Family history of malignant neoplasm, unspecified: Secondary | ICD-10-CM

## 2018-07-21 DIAGNOSIS — D649 Anemia, unspecified: Secondary | ICD-10-CM

## 2018-07-21 DIAGNOSIS — I129 Hypertensive chronic kidney disease with stage 1 through stage 4 chronic kidney disease, or unspecified chronic kidney disease: Secondary | ICD-10-CM | POA: Diagnosis not present

## 2018-07-21 DIAGNOSIS — D631 Anemia in chronic kidney disease: Secondary | ICD-10-CM | POA: Diagnosis not present

## 2018-07-21 DIAGNOSIS — R5383 Other fatigue: Secondary | ICD-10-CM

## 2018-07-21 DIAGNOSIS — N184 Chronic kidney disease, stage 4 (severe): Secondary | ICD-10-CM | POA: Diagnosis present

## 2018-07-21 DIAGNOSIS — N189 Chronic kidney disease, unspecified: Secondary | ICD-10-CM

## 2018-07-21 DIAGNOSIS — Z79899 Other long term (current) drug therapy: Secondary | ICD-10-CM | POA: Diagnosis not present

## 2018-07-21 DIAGNOSIS — J449 Chronic obstructive pulmonary disease, unspecified: Secondary | ICD-10-CM | POA: Diagnosis not present

## 2018-07-21 LAB — COMPREHENSIVE METABOLIC PANEL
ALT: 37 U/L (ref 0–44)
AST: 46 U/L — ABNORMAL HIGH (ref 15–41)
Albumin: 2.2 g/dL — ABNORMAL LOW (ref 3.5–5.0)
Alkaline Phosphatase: 289 U/L — ABNORMAL HIGH (ref 38–126)
Anion gap: 11 (ref 5–15)
BUN: 34 mg/dL — ABNORMAL HIGH (ref 6–20)
CO2: 23 mmol/L (ref 22–32)
Calcium: 8 mg/dL — ABNORMAL LOW (ref 8.9–10.3)
Chloride: 97 mmol/L — ABNORMAL LOW (ref 98–111)
Creatinine, Ser: 3.43 mg/dL — ABNORMAL HIGH (ref 0.61–1.24)
GFR calc Af Amer: 21 mL/min — ABNORMAL LOW (ref >60)
GFR calc non Af Amer: 18 mL/min — ABNORMAL LOW (ref >60)
Glucose, Bld: 588 mg/dL (ref 70–99)
Potassium: 3.7 mmol/L (ref 3.5–5.1)
Sodium: 131 mmol/L — ABNORMAL LOW (ref 135–145)
Total Bilirubin: 0.4 mg/dL (ref 0.3–1.2)
Total Protein: 7.4 g/dL (ref 6.5–8.1)

## 2018-07-21 LAB — CBC WITH DIFFERENTIAL/PLATELET
Abs Immature Granulocytes: 0.06 10*3/uL (ref 0.00–0.07)
BASOS PCT: 1 %
Basophils Absolute: 0 10*3/uL (ref 0.0–0.1)
EOS ABS: 0.2 10*3/uL (ref 0.0–0.5)
Eosinophils Relative: 3 %
HEMATOCRIT: 22.7 % — AB (ref 39.0–52.0)
Hemoglobin: 7 g/dL — ABNORMAL LOW (ref 13.0–17.0)
Immature Granulocytes: 1 %
LYMPHS ABS: 1 10*3/uL (ref 0.7–4.0)
Lymphocytes Relative: 15 %
MCH: 26 pg (ref 26.0–34.0)
MCHC: 30.8 g/dL (ref 30.0–36.0)
MCV: 84.4 fL (ref 80.0–100.0)
MONO ABS: 0.6 10*3/uL (ref 0.1–1.0)
MONOS PCT: 8 %
NEUTROS PCT: 72 %
Neutro Abs: 5 10*3/uL (ref 1.7–7.7)
PLATELETS: 233 10*3/uL (ref 150–400)
RBC: 2.69 MIL/uL — ABNORMAL LOW (ref 4.22–5.81)
RDW: 14.8 % (ref 11.5–15.5)
WBC: 6.8 10*3/uL (ref 4.0–10.5)
nRBC: 0 % (ref 0.0–0.2)

## 2018-07-21 LAB — ABO/RH: ABO/RH(D): O POS

## 2018-07-21 LAB — TSH: TSH: 2.469 u[IU]/mL (ref 0.350–4.500)

## 2018-07-21 LAB — RETICULOCYTES
IMMATURE RETIC FRACT: 28.7 % — AB (ref 2.3–15.9)
RBC.: 2.77 MIL/uL — AB (ref 4.22–5.81)
RETIC COUNT ABSOLUTE: 60.4 10*3/uL (ref 19.0–186.0)
RETIC CT PCT: 2.2 % (ref 0.4–3.1)

## 2018-07-21 LAB — IRON AND TIBC
Iron: 30 ug/dL — ABNORMAL LOW (ref 45–182)
Saturation Ratios: 10 % — ABNORMAL LOW (ref 17.9–39.5)
TIBC: 301 ug/dL (ref 250–450)
UIBC: 271 ug/dL

## 2018-07-21 LAB — SAMPLE TO BLOOD BANK

## 2018-07-21 LAB — FOLATE: Folate: 14.5 ng/mL (ref >5.9)

## 2018-07-21 LAB — PREPARE RBC (CROSSMATCH)

## 2018-07-21 LAB — FERRITIN: Ferritin: 83 ng/mL (ref 24–336)

## 2018-07-21 LAB — VITAMIN B12: Vitamin B-12: 794 pg/mL (ref 180–914)

## 2018-07-21 NOTE — Progress Notes (Signed)
Hematology/Oncology Consult note [facility] Telephone:([phone] [phone]  Patient Care Team: Marina Goodell, MD as PCP - General (Family Medicine)   Name of the patient: Matthew Mayer  [id number]  1961/02/09    Reason for referral- anemia   Referring physician- Dr. Wynelle Link  Date of visit: 07/21/18   History of presenting illness- [name] with a PMH significant for stage IV CKD, type 2 DM, HTN, hyperlipidemia and COPD among other medical problems. He has been referred by nephrology for his anemia.  Recent labs from 07/07/2018 were as follows:CBC showed white count of 11.2, H&H of 7.3/23.9 with an MCV of 84.2 and a platelet count of 283.  Creatinine was elevated at 3.13.  Calcium was low normal at 7.8.  Hepatitis B testing was negative.  Hep C testing was negative.  Patient is a tracheostomy in place from prior gallbladder surgery when he could not be extubated.  His tracheostomy eventually could not be closed because of vocal cord issues.  He is on continuous oxygen through his tracheostomy at 5 L due to his underlying COPD and sleep apnea.  He has also been seen by cardiology prior to getting vascular graft placed in preparation for dialysis.  He has not started any dialysis yet.  Patient mainly reports feeling fatigued.  Denies any blood in his stool or urine.  Appetite is fair and he denies any unintentional weight loss  ECOG PS- 2  Pain scale- 0   Review of systems- Review of Systems  Constitutional: Positive for malaise/fatigue. Negative for chills, fever and weight loss.  HENT: Negative for congestion, ear discharge and nosebleeds.   Eyes: Negative for blurred vision.  Respiratory: Positive for shortness of breath. Negative for cough, hemoptysis, sputum production and wheezing.   Cardiovascular: Negative for chest pain, palpitations, orthopnea and claudication.  Gastrointestinal: Negative for abdominal pain, blood  in stool, constipation, diarrhea, heartburn, melena, nausea and vomiting.  Genitourinary: Negative for dysuria, flank pain, frequency, hematuria and urgency.  Musculoskeletal: Negative for back pain, joint pain and myalgias.  Skin: Negative for rash.  Neurological: Negative for dizziness, tingling, focal weakness, seizures, weakness and headaches.  Endo/Heme/Allergies: Does not bruise/bleed easily.  Psychiatric/Behavioral: Negative for depression and suicidal ideas. The patient does not have insomnia.     Allergies  Allergen Reactions  . Augmentin [Amoxicillin-Pot Clavulanate]   . Biaxin [Clarithromycin]   . Codeine   . Penicillins   . Percocet [Oxycodone-Acetaminophen]     Patient Active Problem List   Diagnosis Date Noted  . Hypertriglyceridemia 01/27/2017  . Acute renal failure superimposed on stage 4 chronic kidney disease (HCC) 07/07/2016  . Chronic respiratory failure with hypoxia and hypercapnia (HCC) 07/07/2016  . Acute pancreatitis 06/23/2016  . Stage 4 chronic kidney disease (HCC) 04/06/2015  . Chronic diastolic CHF (congestive heart failure), NYHA class 3 (HCC) 02/14/2015  . Benign essential hypertension 02/07/2015  . Depression 10/27/2014  . Type 2 diabetes mellitus with diabetic chronic kidney disease (HCC) 07/29/2014  . Chronic low back pain 07/23/2014  . Exertional chest pain 07/23/2014  . Insulin resistance 05/13/2014  . Long-term insulin use (HCC) 05/13/2014  . Bilateral edema of lower extremity 04/05/2014  . 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 GERD (gastroesophageal reflux disease) 11/27/2013  . Obesity, unspecified 11/27/2013  . Tracheostomy in place [facility]) 11/27/2013  . Vocal cord paralysis 08/12/2013  . COPD (chronic obstructive pulmonary disease) (HCC) 07/28/2013  . Tracheostomy status (HCC) 07/28/2013  . Nocturnal  hypoxemia 07/28/2013  . HCAP (healthcare-associated pneumonia) 07/28/2013     Past Medical History:  Diagnosis Date  . COPD (chronic  obstructive pulmonary disease) (HCC)   . Diabetes (HCC)   . Hyperlipidemia   . Hypertension   . Sleep apnea      Past Surgical History:  Procedure Laterality Date  . CHOLECYSTECTOMY    . EXPLORATORY LAPAROTOMY    . Hand ganglion cyst    . laryngoscopy w/excision &/or aspiration  12/04/2013  . TRACHEOSTOMY      Social History   Socioeconomic History  . Marital status: Married    Spouse name: Not on file  . Number of children: Not on file  . Years of education: Not on file  . Highest education level: Not on file  Occupational History  . Not on file  Social Needs  . Financial resource strain: Not on file  . Food insecurity:    Worry: Not on file    Inability: Not on file  . Transportation needs:    Medical: Not on file    Non-medical: Not on file  Tobacco Use  . Smoking status: Former Smoker    Packs/day: 3.00    Years: 38.00    Pack years: 114.00    Types: Cigarettes    Last attempt to quit: 04/30/2013    Years since quitting: 5.2  . Smokeless tobacco: Never Used  Substance and Sexual Activity  . Alcohol use: Yes    Comment: occasional beer  . Drug use: No  . Sexual activity: Not on file  Lifestyle  . Physical activity:    Days per week: Not on file    Minutes per session: Not on file  . Stress: Not on file  Relationships  . Social connections:    Talks on phone: Not on file    Gets together: Not on file    Attends religious service: Not on file    Active member of club or organization: Not on file    Attends meetings of clubs or organizations: Not on file    Relationship status: Not on file  . Intimate partner violence:    Fear of current or ex partner: Not on file    Emotionally abused: Not on file    Physically abused: Not on file    Forced sexual activity: Not on file  Other Topics Concern  . Not on file  Social History Narrative  . Not on file     Family History  Problem Relation Age of Onset  . Coronary artery disease Mother   .  Hypertension Mother   . Osteoporosis Mother   . Heart attack Mother   . Diabetes Father   . Hypertension Father   . CVA Father   . Stroke Father   . Coronary artery disease Sister   . Diabetes Sister   . Hearing loss Sister   . Hyperlipidemia Sister   . Lung disease Sister   . Stroke Sister   . Colon polyps Brother   . Diabetes Brother   . Hearing loss Brother   . Hypertension Brother   . Cancer Maternal Grandmother   . Heart disease Maternal Grandfather   . Diabetes Paternal Grandmother   . Diabetes Paternal Grandfather      Current Outpatient Medications:  .  albuterol (ACCUNEB) 1.25 MG/3ML nebulizer solution, Inhale into the lungs., Disp: , Rfl:  .  albuterol-ipratropium (COMBIVENT) 18-103 MCG/ACT inhaler, Inhale 2 puffs into the lungs every 6 (six) hours  as needed for wheezing or shortness of breath., Disp: , Rfl:  .  arformoterol (BROVANA) 15 MCG/2ML NEBU, Inhale into the lungs., Disp: , Rfl:  .  aspirin 81 MG tablet, Take 81 mg by mouth daily., Disp: , Rfl:  .  atorvastatin (LIPITOR) 10 MG tablet, Take 10 mg by mouth daily., Disp: , Rfl:  .  calcitRIOL (ROCALTROL) 0.25 MCG capsule, Take by mouth daily., Disp: , Rfl: 12 .  carvedilol (COREG) 6.25 MG tablet, TK 1 T PO  BID, Disp: , Rfl: 3 .  Continuous Blood Gluc Sensor (FREESTYLE LIBRE SENSOR SYSTEM) MISC, USE 1 EACH EVERY 10 (TEN) DAYS E11.65, Disp: , Rfl: 11 .  CREON 36000 units CPEP capsule, , Disp: , Rfl: 0 .  cyclobenzaprine (FLEXERIL) 10 MG tablet, TK 1 T PO NIGHTLY PRN FOR MUSCLE SPASMS, Disp: , Rfl: 0 .  DALIRESP 500 MCG TABS tablet, TAKE 1 TABLET EVERY 3 DAYS, GRADUALLY TAPER TO EVERY 2 DAYS, THEN EVERY DAY TO MINIIMIZE SIDE EFFECT, Disp: , Rfl: 10 .  dexamethasone (DECADRON) 1 MG tablet, Take 0.5 mg by mouth daily with breakfast. , Disp: , Rfl:  .  docusate sodium (COLACE) 100 MG capsule, Take by mouth., Disp: , Rfl:  .  DULoxetine (CYMBALTA) 60 MG capsule, Take 60 mg by mouth daily., Disp: , Rfl:  .   esomeprazole (NEXIUM) 40 MG capsule, Take 40 mg by mouth daily at 12 noon., Disp: , Rfl:  .  fenofibrate (TRICOR) 145 MG tablet, , Disp: , Rfl: 0 .  fentaNYL (DURAGESIC - DOSED MCG/HR) 25 MCG/HR patch, Place 25 mcg onto the skin every 3 (three) days., Disp: , Rfl:  .  furosemide (LASIX) 20 MG tablet, Take 20 mg by mouth daily., Disp: , Rfl:  .  guaiFENesin (MUCINEX) 600 MG 12 hr tablet, Take 1,200 mg by mouth 2 (two) times daily., Disp: , Rfl:  .  HUMULIN R 500 UNIT/ML injection, 25 Units 3 (three) times daily with meals. , Disp: , Rfl: 5 .  hydrocortisone valerate cream (WESTCORT) 0.2 %, APPLY TO AFFECTED AREA EVERY DAY AS NEEDED, Disp: , Rfl:  .  insulin detemir (LEVEMIR) 100 UNIT/ML injection, Inject 30 Units into the skin daily., Disp: , Rfl:  .  insulin lispro (HUMALOG) 100 UNIT/ML injection, Inject 20 Units into the skin 3 (three) times daily with meals., Disp: , Rfl:  .  lisinopril (PRINIVIL,ZESTRIL) 20 MG tablet, Take 20 mg by mouth daily., Disp: , Rfl:  .  MELATONIN PO, Take 20 mg by mouth., Disp: , Rfl:  .  Multiple Vitamin (MULTIVITAMIN) tablet, Take 1 tablet by mouth daily., Disp: , Rfl:  .  Oxycodone HCl 10 MG TABS, Take 10 mg by mouth daily as needed., Disp: , Rfl:  .  pantoprazole (PROTONIX) 40 MG tablet, TAKE ONE TABLET BY MOUTH TWICE DAILY, Disp: , Rfl:  .  PARoxetine (PAXIL) 20 MG tablet, TK 1 T PO ONCE DAILY, Disp: , Rfl: 3 .  promethazine (PHENERGAN) 25 MG tablet, TAKE 1 TABLET BY MOUTH EVERY 8 HOURS AS NEEDED FOR NAUSEA, Disp: , Rfl:  .  QUEtiapine (SEROQUEL) 25 MG tablet, TK 1 T PO NIGHTLY, Disp: , Rfl: 0 .  rosuvastatin (CRESTOR) 20 MG tablet, TK 1 T PO ONCE D, Disp: , Rfl: 2 .  sitaGLIPtin-metformin (JANUMET) 50-1000 MG per tablet, Take 1 tablet by mouth daily., Disp: , Rfl:  .  Vitamin D, Ergocalciferol, (DRISDOL) 50000 units CAPS capsule, TK 1 C PO ONCE A MONTH, Disp: ,  Rfl: 6   Physical exam:  Vitals:   07/21/18 1410  BP: 127/72  Pulse: 63  Resp: 18  Temp: (!)  97.1 F (36.2 C)  TempSrc: Tympanic  Weight: 260 lb 12.8 oz (118.3 kg)  Height: 5\' 6"  (1.676 m)   Physical Exam  Constitutional: He is oriented to person, place, and time.  Patient is obese.  He is sitting in a wheelchair and does not appear distressed.  Appears older than stated age  HENT:  Head: Normocephalic and atraumatic.  Tracheostomy in place and connected to continuous oxygen 5 L  Eyes: Pupils are equal, round, and reactive to light. EOM are normal.  Neck: Normal range of motion.  Cardiovascular: Normal rate, regular rhythm and normal heart sounds.  Pulmonary/Chest: Effort normal and breath sounds normal.  Abdominal: Soft. Bowel sounds are normal.  Neurological: He is alert and oriented to person, place, and time.  Skin: Skin is warm and dry.       CMP Latest Ref Rng & Units 02/10/2018  Glucose 65 - 99 mg/dL 409(W)  BUN 6 - 24 mg/dL 11(B)  Creatinine 1.47 - 1.27 mg/dL 8.29(F)  Sodium 621 - 308 mmol/L 132(L)  Potassium 3.5 - 5.2 mmol/L 5.9(H)  Chloride 96 - 106 mmol/L 95(L)  CO2 20 - 29 mmol/L 22  Calcium 8.7 - 10.2 mg/dL 6.5(H)  Total Protein 6.0 - 8.5 g/dL 7.0  Total Bilirubin 0.0 - 1.2 mg/dL 0.5  Alkaline Phos 39 - 117 IU/L 199(H)  AST 0 - 40 IU/L 24  ALT 0 - 44 IU/L 21   CBC Latest Ref Rng & Units 05/26/2013  WBC 3.8 - 10.6 x10 3/mm 3 -  Hemoglobin 13.0 - 18.0 g/dL 8.4(O)  Hematocrit 96.2 - 52.0 % -  Platelets 150 - 440 x10 3/mm 3 -    Assessment and plan- Patient is a 57 y.o. male referred for anemia likely second to chronic kidney disease  Today I will do a complete anemia work-up including a CBC with differential, CMP, ferritin and iron studies, B12 and folate, reticulocyte count, haptoglobin, TSH, myeloma and serum free light chains as well as random urine protein electrophoresis.  I will see the patient back in 2 weeks time to discuss the results of his work-up.  If there is no other cause for his anemia at present other than chronic kidney disease he would  be a candidate for EPO as his hemoglobin is less than 10.  Discussed etiology of her anemia and chronic kidney disease.  Discussed risks and including all but not limited to risk of strokes and thromboembolic events if the hemoglobin is increased more than 11.  I will see him back in 1 week's time to discuss the results of his blood work and further management.  He will also get a hold tube today and if his hemoglobin is less than 7 he will need a blood transfusion which we will arrange for the next 1 to 2 days   Thank you for this kind referral and the opportunity to participate in the care of this patient   Visit Diagnosis 1. Anemia of chronic renal failure, unspecified CKD stage     Dr. Owens Shark, MD, MPH [facility] at [facility] [phone] 07/21/2018 2:46 PM

## 2018-07-21 NOTE — Telephone Encounter (Signed)
Called home and left message that I was going to try cell phone. Pt sugar was 588 and I was able to get wife on the phone and pt was there with her and I told them because the sugar is high and we are not managing his sugar he needs to go to ER and f/u with his pcp and wife states it is Dr.Blackwood and they would not be available now.  Also she states that they will take care of problem when they get home. I told her that from our office standpoint and for his safety they should go to ER with that high of blood sugar. She thanked me for the call and will take my advice into consideration.

## 2018-07-21 NOTE — Progress Notes (Signed)
Pt is weak and tired and hard to concentrate with hgb low.

## 2018-07-21 NOTE — Addendum Note (Signed)
Addended by: Corene Cornea on: 07/21/2018 04:43 PM   Modules accepted: Orders

## 2018-07-22 ENCOUNTER — Inpatient Hospital Stay: Payer: BLUE CROSS/BLUE SHIELD

## 2018-07-22 DIAGNOSIS — D649 Anemia, unspecified: Secondary | ICD-10-CM

## 2018-07-22 DIAGNOSIS — D631 Anemia in chronic kidney disease: Secondary | ICD-10-CM | POA: Diagnosis not present

## 2018-07-22 LAB — PROTEIN ELECTRO, RANDOM URINE
Albumin ELP, Urine: 39.1 %
Alpha-1-Globulin, U: 11.3 %
Alpha-2-Globulin, U: 8 %
Beta Globulin, U: 17.1 %
Gamma Globulin, U: 24.5 %
Total Protein, Urine: 234.3 mg/dL

## 2018-07-22 LAB — KAPPA/LAMBDA LIGHT CHAINS
KAPPA FREE LGHT CHN: 254.3 mg/L — AB (ref 3.3–19.4)
Kappa, lambda light chain ratio: 1.69 — ABNORMAL HIGH (ref 0.26–1.65)
LAMDA FREE LIGHT CHAINS: 150.1 mg/L — AB (ref 5.7–26.3)

## 2018-07-22 LAB — HAPTOGLOBIN: Haptoglobin: 338 mg/dL — ABNORMAL HIGH (ref 34–200)

## 2018-07-22 MED ORDER — ACETAMINOPHEN 325 MG PO TABS: 650.0000 mg | ORAL_TABLET | Freq: Once | ORAL | Status: DC

## 2018-07-22 MED ORDER — SODIUM CHLORIDE 0.9% IV SOLUTION
250.0000 mL | Freq: Once | INTRAVENOUS | Status: AC
  Administered 2018-07-22: 250 mL via INTRAVENOUS
  Filled 2018-07-22: qty 250

## 2018-07-22 NOTE — Patient Instructions (Signed)

## 2018-07-23 ENCOUNTER — Telehealth: Payer: Self-pay | Admitting: *Deleted

## 2018-07-23 LAB — TYPE AND SCREEN
ABO/RH(D): O POS
Antibody Screen: NEGATIVE
UNIT DIVISION: 0

## 2018-07-23 LAB — BPAM RBC
BLOOD PRODUCT EXPIRATION DATE: 201911232359
ISSUE DATE / TIME: 201910291035
Unit Type and Rh: 5100

## 2018-07-23 NOTE — Telephone Encounter (Signed)
-----   Message from Creig Hines, MD sent at 07/22/2018  8:04 AM EDT ----- He needs a trial of IV feraheme before EPO. Thanks, Ovidio Kin

## 2018-07-23 NOTE — Telephone Encounter (Signed)
Called pt's home and left message on voicemail that Dr. Smith Robert would like to try him on feraheme to see if that would help increase his hgb to make anemia better.  I asked him to call me back and I said in my message that we can give him the feraheme on 11/4 when he already has his follow up appt already. I have checked with authorization and he is good to get the feraheme.  I have it added to 11/4 appt in case he is agreeable

## 2018-07-24 ENCOUNTER — Other Ambulatory Visit: Payer: Self-pay | Admitting: Oncology

## 2018-07-24 DIAGNOSIS — D509 Iron deficiency anemia, unspecified: Secondary | ICD-10-CM | POA: Insufficient documentation

## 2018-07-24 LAB — MULTIPLE MYELOMA PANEL, SERUM
ALBUMIN/GLOB SERPL: 0.6 — AB (ref 0.7–1.7)
ALPHA 1: 0.4 g/dL (ref 0.0–0.4)
ALPHA2 GLOB SERPL ELPH-MCNC: 1.1 g/dL — AB (ref 0.4–1.0)
Albumin SerPl Elph-Mcnc: 2.3 g/dL — ABNORMAL LOW (ref 2.9–4.4)
B-Globulin SerPl Elph-Mcnc: 1.3 g/dL (ref 0.7–1.3)
GLOBULIN, TOTAL: 4.1 g/dL — AB (ref 2.2–3.9)
Gamma Glob SerPl Elph-Mcnc: 1.4 g/dL (ref 0.4–1.8)
IGA: 614 mg/dL — AB (ref 90–386)
IGG (IMMUNOGLOBIN G), SERUM: 1658 mg/dL — AB (ref 700–1600)
IGM (IMMUNOGLOBULIN M), SRM: 205 mg/dL — AB (ref 20–172)
TOTAL PROTEIN ELP: 6.4 g/dL (ref 6.0–8.5)

## 2018-07-25 ENCOUNTER — Other Ambulatory Visit: Payer: Self-pay

## 2018-07-25 ENCOUNTER — Encounter
Admission: RE | Admit: 2018-07-25 | Discharge: 2018-07-25 | Disposition: A | Discharge status: Home / Self Care | Payer: BLUE CROSS/BLUE SHIELD | Source: Ambulatory Visit | Attending: Vascular Surgery | Admitting: Vascular Surgery

## 2018-07-25 DIAGNOSIS — Z01812 Encounter for preprocedural laboratory examination: Secondary | ICD-10-CM | POA: Insufficient documentation

## 2018-07-25 HISTORY — DX: Methicillin resistant Staphylococcus aureus infection, unspecified site: A49.02

## 2018-07-25 HISTORY — DX: Gastro-esophageal reflux disease without esophagitis: K21.9

## 2018-07-25 HISTORY — DX: Depression, unspecified: F32.A

## 2018-07-25 HISTORY — DX: Major depressive disorder, single episode, unspecified: F32.9

## 2018-07-25 LAB — CBC
HCT: 28.6 % — ABNORMAL LOW (ref 39.0–52.0)
HEMOGLOBIN: 8.9 g/dL — AB (ref 13.0–17.0)
MCH: 26.2 pg (ref 26.0–34.0)
MCHC: 31.1 g/dL (ref 30.0–36.0)
MCV: 84.1 fL (ref 80.0–100.0)
Platelets: 262 10*3/uL (ref 150–400)
RBC: 3.4 MIL/uL — ABNORMAL LOW (ref 4.22–5.81)
RDW: 15.4 % (ref 11.5–15.5)
WBC: 8.7 10*3/uL (ref 4.0–10.5)
nRBC: 0 % (ref 0.0–0.2)

## 2018-07-25 LAB — BASIC METABOLIC PANEL
ANION GAP: 11 (ref 5–15)
BUN: 28 mg/dL — ABNORMAL HIGH (ref 6–20)
CHLORIDE: 106 mmol/L (ref 98–111)
CO2: 23 mmol/L (ref 22–32)
Calcium: 7.8 mg/dL — ABNORMAL LOW (ref 8.9–10.3)
Creatinine, Ser: 3.38 mg/dL — ABNORMAL HIGH (ref 0.61–1.24)
GFR calc Af Amer: 22 mL/min — ABNORMAL LOW (ref >60)
GFR, EST NON AFRICAN AMERICAN: 19 mL/min — AB (ref >60)
GLUCOSE: 95 mg/dL (ref 70–99)
POTASSIUM: 3.2 mmol/L — AB (ref 3.5–5.1)
Sodium: 140 mmol/L (ref 135–145)

## 2018-07-25 LAB — APTT: aPTT: 35 seconds (ref 24–36)

## 2018-07-25 LAB — PROTIME-INR
INR: 1.21
Prothrombin Time: 15.2 seconds (ref 11.4–15.2)

## 2018-07-25 NOTE — Pre-Procedure Instructions (Addendum)
Dr Sampson Goon and Dr Priscella Mann notified that pt has a trach Shiley #8 uncuffed - discussed plan. Cardiopulmonary notified and Jasmine December in special recovery notified of pt having trach.

## 2018-07-25 NOTE — Patient Instructions (Signed)
Your procedure is scheduled on: July 30, 2018 [facility] Report to [facility] AT 6:20AM   REMEMBER: Instructions that are not followed completely may result in serious medical risk, up to and including death; or upon the discretion of your surgeon and anesthesiologist your surgery may need to be rescheduled.  Do not eat food after midnight the night before surgery.  No gum chewing, lozengers or hard candies.  YOU CAN HAVE WATER UP TO 2 HOURS BEFORE YOU ARRIVE AT THE HOSPITAL  Do NOT drink anything that is not on this list.  Type 1 and Type 2 diabetics should only drink water.  No Alcohol for 24 hours before or after surgery.  No Smoking including e-cigarettes for 24 hours prior to surgery.  No chewable tobacco products for at least 6 hours prior to surgery.  No nicotine patches on the day of surgery.  On the morning of surgery brush your teeth with toothpaste and water, you may rinse your mouth with mouthwash if you wish. Do not swallow any toothpaste or mouthwash.  Notify your doctor if there is any change in your medical condition (cold, fever, infection).  Do not wear jewelry, make-up, hairpins, clips or nail polish.  Do not wear lotions, powders, or perfumes.   Do not shave 48 hours prior to surgery.   Contacts and dentures may not be worn into surgery.  Do not bring valuables to the hospital, including drivers license, insurance or credit cards.  [facility] is not responsible for any belongings or valuables.   TAKE THESE MEDICATIONS THE MORNING OF SURGERY: CARVEDILOL DALIRESP NEXIUM ROSUVASTATIN  Use CHG Soap as directed on instruction sheet.  Use inhalers on the day of surgery and bring to the hospital.  Take 1/2 of usual insulin dose the night before surgery and none on the morning of surgery.  Follow recommendations from Cardiologist, Pulmonologist or PCP regarding stopping Aspirin, Coumadin, Plavix, Eliquis, Pradaxa, or Pletal.  Stop  Anti-inflammatories (NSAIDS) such as Advil, Aleve, Ibuprofen, Motrin, Naproxen, Naprosyn and Aspirin based products such as Excedrin, Goodys Powder, BC Powder. (May take Tylenol or Acetaminophen if needed.)  Stop ANY OVER THE COUNTER supplements until after surgery MELATONIN (May continue Vitamin D, Vitamin B, and multivitamin.)  Wear comfortable clothing (specific to your surgery type) to the hospital.  Plan for stool softeners for home use.  If you are being discharged the day of surgery, you will not be allowed to drive home. You will need a responsible adult to drive you home and stay with you that night.   If you are taking public transportation, you will need to have a responsible adult with you. Please confirm with your physician that it is acceptable to use public transportation.   Please call [phone] if you have any questions about these instructions.

## 2018-07-27 NOTE — Telephone Encounter (Signed)
Wife called back and left message stating he was fine with feraheme on Monday 11/4. I had already checked with insurance and added it on in case he said yes

## 2018-07-28 ENCOUNTER — Inpatient Hospital Stay: Payer: BLUE CROSS/BLUE SHIELD | Attending: Oncology

## 2018-07-28 ENCOUNTER — Encounter: Payer: Self-pay | Admitting: Oncology

## 2018-07-28 ENCOUNTER — Inpatient Hospital Stay (HOSPITAL_BASED_OUTPATIENT_CLINIC_OR_DEPARTMENT_OTHER): Payer: BLUE CROSS/BLUE SHIELD | Admitting: Oncology

## 2018-07-28 ENCOUNTER — Inpatient Hospital Stay: Payer: BLUE CROSS/BLUE SHIELD

## 2018-07-28 ENCOUNTER — Other Ambulatory Visit: Payer: BLUE CROSS/BLUE SHIELD | Admitting: Oncology

## 2018-07-28 VITALS — BP 155/73 | HR 66 | Temp 96.9°F | Resp 18 | Ht 66.0 in | Wt 258.6 lb

## 2018-07-28 VITALS — BP 165/74 | HR 64 | Temp 96.8°F | Resp 18

## 2018-07-28 DIAGNOSIS — D631 Anemia in chronic kidney disease: Secondary | ICD-10-CM | POA: Diagnosis not present

## 2018-07-28 DIAGNOSIS — N189 Chronic kidney disease, unspecified: Principal | ICD-10-CM

## 2018-07-28 DIAGNOSIS — D509 Iron deficiency anemia, unspecified: Secondary | ICD-10-CM

## 2018-07-28 DIAGNOSIS — E1122 Type 2 diabetes mellitus with diabetic chronic kidney disease: Secondary | ICD-10-CM

## 2018-07-28 DIAGNOSIS — D649 Anemia, unspecified: Secondary | ICD-10-CM

## 2018-07-28 DIAGNOSIS — N184 Chronic kidney disease, stage 4 (severe): Secondary | ICD-10-CM | POA: Diagnosis not present

## 2018-07-28 DIAGNOSIS — J449 Chronic obstructive pulmonary disease, unspecified: Secondary | ICD-10-CM

## 2018-07-28 DIAGNOSIS — I129 Hypertensive chronic kidney disease with stage 1 through stage 4 chronic kidney disease, or unspecified chronic kidney disease: Secondary | ICD-10-CM

## 2018-07-28 DIAGNOSIS — Z87891 Personal history of nicotine dependence: Secondary | ICD-10-CM | POA: Diagnosis not present

## 2018-07-28 DIAGNOSIS — R197 Diarrhea, unspecified: Secondary | ICD-10-CM

## 2018-07-28 DIAGNOSIS — Z930 Tracheostomy status: Secondary | ICD-10-CM

## 2018-07-28 LAB — CBC
HEMATOCRIT: 29 % — AB (ref 39.0–52.0)
HEMOGLOBIN: 8.9 g/dL — AB (ref 13.0–17.0)
MCH: 26.5 pg (ref 26.0–34.0)
MCHC: 30.7 g/dL (ref 30.0–36.0)
MCV: 86.3 fL (ref 80.0–100.0)
NRBC: 0 % (ref 0.0–0.2)
Platelets: 276 10*3/uL (ref 150–400)
RBC: 3.36 MIL/uL — AB (ref 4.22–5.81)
RDW: 15.7 % — ABNORMAL HIGH (ref 11.5–15.5)
WBC: 10.7 10*3/uL — AB (ref 4.0–10.5)

## 2018-07-28 LAB — SAMPLE TO BLOOD BANK

## 2018-07-28 MED ORDER — SODIUM CHLORIDE 0.9 % IV SOLN
Freq: Once | INTRAVENOUS | Status: AC
  Administered 2018-07-28: 11:00:00 via INTRAVENOUS
  Filled 2018-07-28: qty 250

## 2018-07-28 MED ORDER — SODIUM CHLORIDE 0.9 % IV SOLN
510.0000 mg | Freq: Once | INTRAVENOUS | Status: AC
  Administered 2018-07-28: 510 mg via INTRAVENOUS
  Filled 2018-07-28 (×2): qty 17

## 2018-07-28 NOTE — Patient Instructions (Signed)

## 2018-07-28 NOTE — Progress Notes (Signed)
Patient received his 1st Feraheme treatment today. Tolerated well. Ambulatory at time of discharge.

## 2018-07-28 NOTE — Progress Notes (Signed)
Hematology/Oncology Consult note [facility]  [phone] [phone]  Patient Care Team: Sofie Hartigan, MD as PCP - General (Family Medicine)   Name of the patient: Matthew Mayer  [id number]  [birth year]   Date of visit: 07/28/18  Diagnosis-anemia likely secondary to chronic kidney disease along with a component of iron deficiency  Chief complaint/ Reason for visit-discuss results of blood work  Heme/Onc history: [name] with a PMH significant for stage IV CKD, type 2 DM, HTN, hyperlipidemia and COPD among other medical problems. He has been referred by nephrology for his anemia.  Recent labs from 07/07/2018 were as follows:CBC showed white count of 11.2, H&H of 7.3/23.9 with an MCV of 84.2 and a platelet count of 283.  Creatinine was elevated at 3.13.  Calcium was low normal at 7.8.  Hepatitis B testing was negative.  Hep C testing was negative.  Patient is a tracheostomy in place from prior gallbladder surgery when he could not be extubated.  His tracheostomy eventually could not be closed because of vocal cord issues.  He is on continuous oxygen through his tracheostomy at 5 L due to his underlying COPD and sleep apnea.  He has also been seen by cardiology prior to getting vascular graft placed in preparation for dialysis.  He has not started any dialysis yet.    Results of blood work from 07/21/2018 were as follows: CBC showed white count of 2.8, H&H of 7/22.7 and a platelet count of 33 CMP was significant for elevated blood sugar of 588 and creatinine of 3.4.  Folate level was normal at 14.5.  Ferritin was normal at 83.  Iron study showed low iron saturation of 10% and low serum iron of 30.  TIBC was normal at 301.  Reticulocyte count was low for the degree of anemia at 2.2 haptoglobin was elevated at 338.  TSH was normal at 2.4.  Multiple myeloma panel showed polyclonal gammopathy but no evidence of M protein.  Kappa  lambda light chain ratio was abnormal at 1.69 with both kappa and lambda light chains that were elevated.  Interval history-reports some ongoing diarrhea for the last few days.  He has had some diarrhea prior to that as well but particularly worse over 2-3 days  ECOG PS- 2 Pain scale- 0 Opioid associated constipation- no  Review of systems- Review of Systems  Constitutional: Positive for malaise/fatigue. Negative for chills, fever and weight loss.  HENT: Negative for congestion, ear discharge and nosebleeds.   Eyes: Negative for blurred vision.  Respiratory: Positive for shortness of breath. Negative for cough, hemoptysis, sputum production and wheezing.   Cardiovascular: Negative for chest pain, palpitations, orthopnea and claudication.  Gastrointestinal: Negative for abdominal pain, blood in stool, constipation, diarrhea, heartburn, melena, nausea and vomiting.  Genitourinary: Negative for dysuria, flank pain, frequency, hematuria and urgency.  Musculoskeletal: Negative for back pain, joint pain and myalgias.  Skin: Negative for rash.  Neurological: Negative for dizziness, tingling, focal weakness, seizures, weakness and headaches.  Endo/Heme/Allergies: Does not bruise/bleed easily.  Psychiatric/Behavioral: Negative for depression and suicidal ideas. The patient does not have insomnia.       Allergies  Allergen Reactions  . Augmentin [Amoxicillin-Pot Clavulanate] Anaphylaxis  . Penicillins Anaphylaxis    Has patient had a PCN reaction causing immediate rash, facial/tongue/throat swelling, SOB or lightheadedness with hypotension: Yes Has patient had a PCN reaction causing severe rash involving mucus membranes or skin necrosis: No Has  patient had a PCN reaction that required hospitalization: Yes Has patient had a PCN reaction occurring within the last 10 years: Yes If all of the above answers are "NO", then may proceed with Cephalosporin use.   . Codeine Itching  . Biaxin  [Clarithromycin] Rash     Past Medical History:  Diagnosis Date  . Anemia   . Chronic kidney disease    stage 4  . COPD (chronic obstructive pulmonary disease) ([place])   . Depression   . Diabetes ([place])   . GERD (gastroesophageal reflux disease)   . Hyperlipidemia   . Hypertension   . MRSA (methicillin resistant Staphylococcus aureus)    pt states had in past  . Sleep apnea      Past Surgical History:  Procedure Laterality Date  . CHOLECYSTECTOMY    . EXPLORATORY LAPAROTOMY    . Hand ganglion cyst Left   . laryngoscopy w/excision &/or aspiration  12/04/2013  . TRACHEOSTOMY      Social History   Socioeconomic History  . Marital status: Married    Spouse name: Not on file  . Number of children: Not on file  . Years of education: Not on file  . Highest education level: Not on file  Occupational History  . Not on file  Social Needs  . Financial resource strain: Not on file  . Food insecurity:    Worry: Not on file    Inability: Not on file  . Transportation needs:    Medical: Not on file    Non-medical: Not on file  Tobacco Use  . Smoking status: Former Smoker    Packs/day: 3.00    Years: 38.00    Pack years: 114.00    Types: Cigarettes    Last attempt to quit: 04/30/2013    Years since quitting: 5.2  . Smokeless tobacco: Never Used  Substance and Sexual Activity  . Alcohol use: Not Currently  . Drug use: No  . Sexual activity: Not on file  Lifestyle  . Physical activity:    Days per week: Not on file    Minutes per session: Not on file  . Stress: Not on file  Relationships  . Social connections:    Talks on phone: Not on file    Gets together: Not on file    Attends religious service: Not on file    Active member of club or organization: Not on file    Attends meetings of clubs or organizations: Not on file    Relationship status: Not on file  . Intimate partner violence:    Fear of current or ex partner: Not on file    Emotionally abused: Not on  file    Physically abused: Not on file    Forced sexual activity: Not on file  Other Topics Concern  . Not on file  Social History Narrative  . Not on file    Family History  Problem Relation Age of Onset  . Coronary artery disease Mother   . Hypertension Mother   . Osteoporosis Mother   . Heart attack Mother   . Diabetes Father   . Hypertension Father   . CVA Father   . Stroke Father   . Coronary artery disease Sister   . Diabetes Sister   . Hearing loss Sister   . Hyperlipidemia Sister   . Lung disease Sister   . Stroke Sister   . Colon polyps Brother   . Diabetes Brother   . Hearing loss Brother   .  Hypertension Brother   . Cancer Maternal Grandmother   . Heart disease Maternal Grandfather   . Diabetes Paternal Grandmother   . Diabetes Paternal Grandfather      Current Outpatient Medications:  .  albuterol (ACCUNEB) 1.25 MG/3ML nebulizer solution, Inhale 1 ampule into the lungs every 6 (six) hours as needed for wheezing or shortness of breath. , Disp: , Rfl:  .  arformoterol (BROVANA) 15 MCG/2ML NEBU, Take 15 mcg by nebulization 2 (two) times daily as needed (shortness of breath). , Disp: , Rfl:  .  calcitRIOL (ROCALTROL) 0.25 MCG capsule, Take 0.25 mcg by mouth every evening. , Disp: , Rfl: 12 .  carvedilol (COREG) 25 MG tablet, Take 25 mg by mouth 2 (two) times daily with a meal., Disp: , Rfl:  .  clindamycin (CLEOCIN T) 1 % external solution, Apply 1 application topically daily as needed (rash)., Disp: , Rfl:  .  Continuous Blood Gluc Sensor (FREESTYLE LIBRE SENSOR SYSTEM) MISC, USE 1 EACH EVERY 10 (TEN) DAYS E11.65, Disp: , Rfl: 11 .  cyclobenzaprine (FLEXERIL) 10 MG tablet, Take 10 mg by mouth at bedtime. , Disp: , Rfl: 0 .  DALIRESP 500 MCG TABS tablet, Take 500 mcg by mouth daily. , Disp: , Rfl: 10 .  esomeprazole (NEXIUM) 20 MG capsule, Take 40 mg by mouth daily at 12 noon., Disp: , Rfl:  .  fenofibrate (TRICOR) 145 MG tablet, Take 145 mg by mouth every  evening. , Disp: , Rfl: 0 .  furosemide (LASIX) 40 MG tablet, Take 40 mg by mouth See admin instructions. Take 40 mg daily may take a second 40 mg dose as needed for swelling or weight gain 2lbs in 24 hours, Disp: , Rfl:  .  HUMULIN R 500 UNIT/ML injection, Inject 15-30 Units into the skin 3 (three) times daily with meals. , Disp: , Rfl: 5 .  hydrocortisone valerate cream (WESTCORT) 0.2 %, Apply 1 application topically 2 (two) times daily as needed (itching / rash). , Disp: , Rfl:  .  hydrOXYzine (ATARAX/VISTARIL) 25 MG tablet, Take 25 mg by mouth 3 (three) times daily as needed for itching., Disp: , Rfl:  .  lisinopril (PRINIVIL,ZESTRIL) 20 MG tablet, Take 20 mg by mouth at bedtime. , Disp: , Rfl:  .  Melatonin 10 MG TABS, Take 20 mg by mouth at bedtime., Disp: , Rfl:  .  Multiple Vitamin (MULTIVITAMIN) tablet, Take 1 tablet by mouth daily., Disp: , Rfl:  .  naproxen sodium (ALEVE) 220 MG tablet, Take 220 mg by mouth 2 (two) times daily as needed., Disp: , Rfl:  .  PARoxetine (PAXIL) 30 MG tablet, Take 30 mg by mouth at bedtime. , Disp: , Rfl:  .  promethazine (PHENERGAN) 25 MG tablet, Take 25 mg by mouth every 8 (eight) hours as needed for nausea or vomiting. , Disp: , Rfl:  .  rosuvastatin (CRESTOR) 20 MG tablet, Take 20 mg by mouth daily. , Disp: , Rfl: 2 .  sodium chloride HYPERTONIC 3 % nebulizer solution, Take 3 mLs by nebulization as needed for cough., Disp: , Rfl:  .  Vitamin D, Ergocalciferol, (DRISDOL) 50000 units CAPS capsule, Take 50,000 Units by mouth every 30 (thirty) days. , Disp: , Rfl: 6  Physical exam:  Vitals:   07/28/18 0953  BP: (!) 155/73  Pulse: 66  Resp: 18  Temp: (!) 96.9 F (36.1 C)  TempSrc: Tympanic  Weight: 258 lb 9.6 oz (117.3 kg)  Height: _0  (1.676 m)  Physical Exam  Constitutional: He is oriented to person, place, and time.  HENT:  Head: Normocephalic and atraumatic.  Tracheostomy in place  Eyes: Pupils are equal, round, and reactive to light. EOM  are normal.  Neck: Normal range of motion.  Cardiovascular: Normal rate, regular rhythm and normal heart sounds.  Pulmonary/Chest: Effort normal and breath sounds normal.  Abdominal: Soft. Bowel sounds are normal.  Neurological: He is alert and oriented to person, place, and time.  Skin: Skin is warm and dry.     CMP Latest Ref Rng & Units 07/25/2018  Glucose 70 - 99 mg/dL 95  BUN 6 - 20 mg/dL 28(H)  Creatinine 0.61 - 1.24 mg/dL 3.38(H)  Sodium 135 - 145 mmol/L 140  Potassium 3.5 - 5.1 mmol/L 3.2(L)  Chloride 98 - 111 mmol/L 106  CO2 22 - 32 mmol/L 23  Calcium 8.9 - 10.3 mg/dL 7.8(L)  Total Protein 6.5 - 8.1 g/dL -  Total Bilirubin 0.3 - 1.2 mg/dL -  Alkaline Phos 38 - 126 U/L -  AST 15 - 41 U/L -  ALT 0 - 44 U/L -   CBC Latest Ref Rng & Units 07/28/2018  WBC 4.0 - 10.5 K/uL 10.7(H)  Hemoglobin 13.0 - 17.0 g/dL 8.9(L)  Hematocrit 39.0 - 52.0 % 29.0(L)  Platelets 150 - 400 K/uL 276      Assessment and plan- Patient is a 57 y.o. male referred for anemia of chronic kidney disease  I discussed the results of the blood work with the patient in detail.  Results of anemia work-up is significant for anemia of chronic kidney disease with some component of iron deficiency given that he has a low iron saturation as well as low serum iron and his ferritin is less than 100.  I would like to give him a trial of IV iron before proceeding with EPO shots.  Discussed risks and benefits of IV iron including all but not limited to headache, leg swelling and possible risk of infusion reaction.  Patient understands and agrees to proceed as planned.  He will be receiving his first dose of Feraheme today and second dose next week.  I will see him back in 2 months time with repeat CBC ferritin and iron studies.  I have asked the patient to discuss his diarrhea with his gastroenterologist.  He has seen GI from [facility] in the past as well as Dr. Allen Norris   Visit Diagnosis 1. Anemia of chronic renal failure,  unspecified CKD stage   2. Iron deficiency anemia, unspecified iron deficiency anemia type      Dr. Randa Evens, MD, MPH [facility] [phone] 07/28/2018 1:21 PM

## 2018-07-28 NOTE — Progress Notes (Signed)
Pt having diarrhea and feels weak. Had low sugar this am and took juice and it is up in 70's now.

## 2018-07-30 ENCOUNTER — Ambulatory Visit: Payer: BLUE CROSS/BLUE SHIELD | Admitting: Certified Registered"

## 2018-07-30 ENCOUNTER — Ambulatory Visit
Admission: RE | Admit: 2018-07-30 | Discharge: 2018-07-30 | Disposition: A | Discharge status: Home / Self Care | Payer: BLUE CROSS/BLUE SHIELD | Source: Ambulatory Visit | Attending: Vascular Surgery | Admitting: Vascular Surgery

## 2018-07-30 ENCOUNTER — Encounter
Admission: RE | Disposition: A | Discharge status: Home / Self Care | Payer: Self-pay | Source: Ambulatory Visit | Attending: Vascular Surgery

## 2018-07-30 ENCOUNTER — Ambulatory Visit: Admit: 2018-07-30 | Payer: BLUE CROSS/BLUE SHIELD | Admitting: Vascular Surgery

## 2018-07-30 DIAGNOSIS — G4730 Sleep apnea, unspecified: Secondary | ICD-10-CM | POA: Diagnosis not present

## 2018-07-30 DIAGNOSIS — Z87891 Personal history of nicotine dependence: Secondary | ICD-10-CM | POA: Diagnosis not present

## 2018-07-30 DIAGNOSIS — Z79899 Other long term (current) drug therapy: Secondary | ICD-10-CM | POA: Insufficient documentation

## 2018-07-30 DIAGNOSIS — Z930 Tracheostomy status: Secondary | ICD-10-CM | POA: Insufficient documentation

## 2018-07-30 DIAGNOSIS — Z8614 Personal history of Methicillin resistant Staphylococcus aureus infection: Secondary | ICD-10-CM | POA: Diagnosis not present

## 2018-07-30 DIAGNOSIS — Z992 Dependence on renal dialysis: Secondary | ICD-10-CM | POA: Diagnosis not present

## 2018-07-30 DIAGNOSIS — E785 Hyperlipidemia, unspecified: Secondary | ICD-10-CM | POA: Insufficient documentation

## 2018-07-30 DIAGNOSIS — Z9889 Other specified postprocedural states: Secondary | ICD-10-CM | POA: Insufficient documentation

## 2018-07-30 DIAGNOSIS — E1122 Type 2 diabetes mellitus with diabetic chronic kidney disease: Secondary | ICD-10-CM | POA: Insufficient documentation

## 2018-07-30 DIAGNOSIS — Z6841 Body Mass Index (BMI) 40.0 and over, adult: Secondary | ICD-10-CM | POA: Insufficient documentation

## 2018-07-30 DIAGNOSIS — E669 Obesity, unspecified: Secondary | ICD-10-CM | POA: Diagnosis not present

## 2018-07-30 DIAGNOSIS — N189 Chronic kidney disease, unspecified: Secondary | ICD-10-CM

## 2018-07-30 DIAGNOSIS — I132 Hypertensive heart and chronic kidney disease with heart failure and with stage 5 chronic kidney disease, or end stage renal disease: Secondary | ICD-10-CM | POA: Insufficient documentation

## 2018-07-30 DIAGNOSIS — Z9049 Acquired absence of other specified parts of digestive tract: Secondary | ICD-10-CM | POA: Diagnosis not present

## 2018-07-30 DIAGNOSIS — F329 Major depressive disorder, single episode, unspecified: Secondary | ICD-10-CM | POA: Insufficient documentation

## 2018-07-30 DIAGNOSIS — Z794 Long term (current) use of insulin: Secondary | ICD-10-CM | POA: Insufficient documentation

## 2018-07-30 DIAGNOSIS — I5032 Chronic diastolic (congestive) heart failure: Secondary | ICD-10-CM | POA: Diagnosis not present

## 2018-07-30 DIAGNOSIS — N186 End stage renal disease: Secondary | ICD-10-CM | POA: Insufficient documentation

## 2018-07-30 DIAGNOSIS — K219 Gastro-esophageal reflux disease without esophagitis: Secondary | ICD-10-CM | POA: Diagnosis not present

## 2018-07-30 DIAGNOSIS — J449 Chronic obstructive pulmonary disease, unspecified: Secondary | ICD-10-CM | POA: Diagnosis not present

## 2018-07-30 HISTORY — PX: AV FISTULA INSERTION W/ RF MAGNETIC GUIDANCE: CATH118308

## 2018-07-30 LAB — GLUCOSE, CAPILLARY
Glucose-Capillary: 138 mg/dL — ABNORMAL HIGH (ref 70–99)
Glucose-Capillary: 130 mg/dL — ABNORMAL HIGH (ref 70–99)

## 2018-07-30 SURGERY — AV FISTULA INSERTION W/RF MAGNETIC GUIDANCE
Anesthesia: General | Laterality: Left

## 2018-07-30 SURGERY — Surgical Case
Anesthesia: *Unknown

## 2018-07-30 MED ORDER — ONDANSETRON HCL 4 MG/2ML IJ SOLN
INTRAMUSCULAR | Status: DC | PRN
  Administered 2018-07-30: 4 mg via INTRAVENOUS

## 2018-07-30 MED ORDER — NITROGLYCERIN 5 MG/ML IV SOLN
INTRAVENOUS | Status: AC
  Filled 2018-07-30: qty 10

## 2018-07-30 MED ORDER — EPHEDRINE SULFATE 50 MG/ML IJ SOLN
INTRAMUSCULAR | Status: DC | PRN
  Administered 2018-07-30: 5 mg via INTRAVENOUS

## 2018-07-30 MED ORDER — FAMOTIDINE 20 MG PO TABS: 40.0000 mg | ORAL_TABLET | ORAL | Status: DC | PRN

## 2018-07-30 MED ORDER — VERAPAMIL HCL 2.5 MG/ML IV SOLN
INTRAVENOUS | Status: AC
  Filled 2018-07-30: qty 2

## 2018-07-30 MED ORDER — OXYCODONE-ACETAMINOPHEN 5-325 MG PO TABS: 1.0000 | ORAL_TABLET | Freq: Four times a day (QID) | ORAL | 0 refills | Status: AC | PRN

## 2018-07-30 MED ORDER — PROPOFOL 10 MG/ML IV BOLUS
INTRAVENOUS | Status: DC | PRN
  Administered 2018-07-30: 150 mg via INTRAVENOUS

## 2018-07-30 MED ORDER — CLINDAMYCIN PHOSPHATE 300 MG/50ML IV SOLN
300.0000 mg | Freq: Once | INTRAVENOUS | Status: AC
  Administered 2018-07-30: 300 mg via INTRAVENOUS

## 2018-07-30 MED ORDER — PHENYLEPHRINE HCL 10 MG/ML IJ SOLN
INTRAMUSCULAR | Status: AC
  Filled 2018-07-30: qty 1

## 2018-07-30 MED ORDER — HEPARIN (PORCINE) IN NACL 1000-0.9 UT/500ML-% IV SOLN
INTRAVENOUS | Status: AC
  Filled 2018-07-30: qty 1000

## 2018-07-30 MED ORDER — CLINDAMYCIN PHOSPHATE 300 MG/50ML IV SOLN
INTRAVENOUS | Status: AC
  Filled 2018-07-30: qty 50

## 2018-07-30 MED ORDER — SODIUM CHLORIDE 0.9 % IV SOLN
INTRAVENOUS | Status: DC
  Administered 2018-07-30: 08:00:00 via INTRAVENOUS

## 2018-07-30 MED ORDER — ONDANSETRON HCL 4 MG/2ML IJ SOLN
INTRAMUSCULAR | Status: AC
  Filled 2018-07-30: qty 2

## 2018-07-30 MED ORDER — MORPHINE SULFATE (PF) 4 MG/ML IV SOLN
2.0000 mg | INTRAVENOUS | Status: DC | PRN
  Administered 2018-07-30: 2 mg via INTRAVENOUS

## 2018-07-30 MED ORDER — IOPAMIDOL (ISOVUE-300) INJECTION 61%
INTRAVENOUS | Status: DC | PRN
  Administered 2018-07-30: 40 mL via INTRAVENOUS

## 2018-07-30 MED ORDER — SODIUM CHLORIDE 0.9 % IV SOLN
INTRAVENOUS | Status: DC | PRN
  Administered 2018-07-30: 20 ug/min via INTRAVENOUS

## 2018-07-30 MED ORDER — HEPARIN SODIUM (PORCINE) 1000 UNIT/ML IJ SOLN
INTRAMUSCULAR | Status: AC
  Filled 2018-07-30: qty 1

## 2018-07-30 MED ORDER — PROPOFOL 10 MG/ML IV BOLUS
INTRAVENOUS | Status: AC
  Filled 2018-07-30: qty 20

## 2018-07-30 MED ORDER — ONDANSETRON HCL 4 MG/2ML IJ SOLN: 4.0000 mg | Freq: Four times a day (QID) | INTRAMUSCULAR | Status: DC | PRN

## 2018-07-30 MED ORDER — FENTANYL CITRATE (PF) 100 MCG/2ML IJ SOLN
INTRAMUSCULAR | Status: AC
  Filled 2018-07-30: qty 2

## 2018-07-30 MED ORDER — FENTANYL CITRATE (PF) 100 MCG/2ML IJ SOLN
25.0000 ug | INTRAMUSCULAR | Status: DC | PRN
  Administered 2018-07-30: 25 ug via INTRAVENOUS
  Administered 2018-07-30: 50 ug via INTRAVENOUS
  Administered 2018-07-30: 25 ug via INTRAVENOUS

## 2018-07-30 MED ORDER — HYDROMORPHONE HCL 1 MG/ML IJ SOLN: 1.0000 mg | Freq: Once | INTRAMUSCULAR | Status: DC | PRN

## 2018-07-30 MED ORDER — EPHEDRINE SULFATE 50 MG/ML IJ SOLN
INTRAMUSCULAR | Status: AC
  Filled 2018-07-30: qty 1

## 2018-07-30 MED ORDER — FENTANYL CITRATE (PF) 100 MCG/2ML IJ SOLN
INTRAMUSCULAR | Status: DC | PRN
  Administered 2018-07-30 (×2): 50 ug via INTRAVENOUS

## 2018-07-30 MED ORDER — LIDOCAINE HCL (CARDIAC) PF 100 MG/5ML IV SOSY
PREFILLED_SYRINGE | INTRAVENOUS | Status: DC | PRN
  Administered 2018-07-30: 100 mg via INTRAVENOUS

## 2018-07-30 MED ORDER — LIDOCAINE HCL (PF) 2 % IJ SOLN
INTRAMUSCULAR | Status: AC
  Filled 2018-07-30: qty 10

## 2018-07-30 MED ORDER — ONDANSETRON HCL 4 MG/2ML IJ SOLN: 4.0000 mg | Freq: Once | INTRAMUSCULAR | Status: DC | PRN

## 2018-07-30 MED ORDER — METHYLPREDNISOLONE SODIUM SUCC 125 MG IJ SOLR: 125.0000 mg | INTRAMUSCULAR | Status: DC | PRN

## 2018-07-30 MED ORDER — MORPHINE SULFATE (PF) 2 MG/ML IV SOLN
INTRAVENOUS | Status: AC
  Filled 2018-07-30: qty 1

## 2018-07-30 MED ORDER — PHENYLEPHRINE HCL 10 MG/ML IJ SOLN
INTRAMUSCULAR | Status: DC | PRN
  Administered 2018-07-30: 100 ug via INTRAVENOUS
  Administered 2018-07-30: 50 ug via INTRAVENOUS
  Administered 2018-07-30: 100 ug via INTRAVENOUS
  Administered 2018-07-30: 50 ug via INTRAVENOUS
  Administered 2018-07-30: 100 ug via INTRAVENOUS
  Administered 2018-07-30 (×2): 50 ug via INTRAVENOUS

## 2018-07-30 MED ORDER — HEPARIN SODIUM (PORCINE) 1000 UNIT/ML IJ SOLN
INTRAMUSCULAR | Status: DC | PRN
  Administered 2018-07-30: 3000 [IU] via INTRAVENOUS

## 2018-07-30 MED ORDER — FENTANYL CITRATE (PF) 100 MCG/2ML IJ SOLN
INTRAMUSCULAR | Status: AC
  Administered 2018-07-30: 50 ug via INTRAVENOUS
  Filled 2018-07-30: qty 2

## 2018-07-30 SURGICAL SUPPLY — 23 items
CATH BEACON 5 .035 40 KMP TP (CATHETERS) ×1 IMPLANT
CATH BEACON 5 .038 40 KMP TP (CATHETERS) ×2
CATH MICROCATH PRGRT 2.8F 110 (CATHETERS) ×1 IMPLANT
CATH WAVELINQ 4FR (CATHETERS) ×3 IMPLANT
COVER DRAPE FLUORO 36X44 (DRAPES) ×3 IMPLANT
DEVICE OCCLUSION POD5 (Vascular Products) ×1 IMPLANT
DEVICE OCCLUSION PODJ30 (Vascular Products) ×1 IMPLANT
DRAPE BRACHIAL (DRAPES) ×3 IMPLANT
GUIDEWIRE ADV .018X180CM (WIRE) ×3 IMPLANT
HANDLE DETACHMENT COIL (MISCELLANEOUS) ×3 IMPLANT
MICROCATH PROGREAT 2.8F 110 CM (CATHETERS) ×3
NEEDLE ENTRY 21GA 7CM ECHOTIP (NEEDLE) ×3 IMPLANT
OCCLUSION DEVICE POD5 (Vascular Products) ×3 IMPLANT
OCCLUSION DEVICE PODJ30 (Vascular Products) ×3 IMPLANT
PACK ANGIOGRAPHY (CUSTOM PROCEDURE TRAY) ×3 IMPLANT
SET INTRO CAPELLA COAXIAL (SET/KITS/TRAYS/PACK) ×6 IMPLANT
SHEATH  7FR 11CM 018 (SHEATH) ×2
SHEATH 7FR 11CM 018 (SHEATH) ×1 IMPLANT
SHEATH BRITE TIP 5FRX11 (SHEATH) ×3 IMPLANT
SHEATH GLIDE SLENDER 4/5FR (SHEATH) ×6 IMPLANT
SHIELD RADPAD DADD DRAPE 4X9 (MISCELLANEOUS) ×3 IMPLANT
VALVE HEMO TOUHY BORST Y (ADAPTER) ×6 IMPLANT
WIRE SPARTACORE .014X190CM (WIRE) ×6 IMPLANT

## 2018-07-30 NOTE — Anesthesia Postprocedure Evaluation (Signed)
Anesthesia Post Note  Patient: Matthew Mayer  Procedure(s) Performed: AV FISTULA INSERTION W/RF MAGNETIC GUIDANCE (Left )  Patient location during evaluation: PACU Anesthesia Type: General Level of consciousness: awake and alert Pain management: pain level controlled Vital Signs Assessment: post-procedure vital signs reviewed and stable Respiratory status: spontaneous breathing, nonlabored ventilation, respiratory function stable and patient connected to nasal cannula oxygen Cardiovascular status: blood pressure returned to baseline and stable Postop Assessment: no apparent nausea or vomiting Anesthetic complications: no     Last Vitals:  Vitals:   07/30/18 1137 07/30/18 1152  BP: (!) 113/43 (!) 124/57  Pulse: 63 65  Resp: (!) 21 14  Temp:    SpO2: 96% 97%    Last Pain:  Vitals:   07/30/18 1152  TempSrc:   PainSc: 3                  Lenard Simmer

## 2018-07-30 NOTE — Anesthesia Procedure Notes (Addendum)
Performed by: Casey Burkitt, CRNA Pre-anesthesia Checklist: Timeout performed, Patient being monitored, Suction available, Emergency Drugs available and Patient identified Patient Re-evaluated:Patient Re-evaluated prior to induction Oxygen Delivery Method: Circle system utilized Preoxygenation: Pre-oxygenation with 100% oxygen Induction Type: IV induction and Tracheostomy Comments: Ventilating through patient's preexisting 8.0 uncuffed shiley. 8.0 cuffed shiley available in room.

## 2018-07-30 NOTE — Discharge Instructions (Signed)
No Lifting > 15lbs with the left arm for the next two weeks  No driving for 48 hours.

## 2018-07-30 NOTE — H&P (Signed)
[facility] History & Physical Update  The patient was interviewed and re-examined.  The patient's previous History and Physical has been reviewed and is unchanged.  There is no change in the plan of care. We plan to proceed with the scheduled procedure.  Levora Dredge, MD  07/30/2018, 7:24 AM

## 2018-07-30 NOTE — Op Note (Signed)
[facility]  Percutaneous Study/Intervention Procedural Note   Date of Surgery: 07/30/2018,10:08 AM  Surgeon: Levora Dredge  Pre-operative Diagnosis: End-stage renal disease requiring hemodialysis  Post-operative diagnosis:  Same  Procedure(s) Performed:  1.  Placement 5French venous sheath left brachial vein with ultrasound guidance  2.  Placement 5 French arterial sheath left brachial artery with ultrasound guidance  3.  Left arm arteriography second-order catheter placement  4.  Left arm venography  5.  Creation of an ulnar ulnar fistula using radiofrequency with the [facility] system  6.  Coil embolization of the left brachial vein    Anesthesia: General anesthesia  Sheath: 7 French sheath left brachial vein and 6 French sheath left brachial artery  Contrast: 40 cc   Fluoroscopy Time: 12.7 minutes  Indications: Patient presents with end-stage renal disease and will require hemodialysis.  Therefore appropriate upper extremity access is being created.  The patient has been preoperatively vein mapped and is found to be a good candidate for the [facility] radiofrequency system.  Risks and benefits of been reviewed all questions been answered patient has been shown a video of the [place].  The patient agrees to proceed with surgery   Procedure:  Matthew Mayer a 57 y.o. male who was identified and appropriate procedural time out was performed.  The patient was then placed supine on the special procedures table with the left arm extended palm upward and secured to the armboard in the standard fashion.  The left arm was then prepped and draped in the usual sterile fashion.    Ultrasound was used to evaluate the left brachial vein.  The brachial vein was echolucent and compressible indicating it is patent .  An ultrasound image was acquired for the permanent record.  A micropuncture needle was used to access the left brachial vein under direct ultrasound  guidance.  The microwire was then advanced under fluoroscopic guidance without difficulty followed by the micro-sheath.    Ultrasound was then used to evaluate the left brachial artery.  The brachial artery was echolucent and pulsatile indicating patency.  An ultrasound image was acquired for the permanent record.  A micropuncture needle was then used to access the left brachial artery.  Microwire was then advanced followed by the micro sheath.  Hand-injection of contrast was then used to create the initial arteriogram.  A [place] core wire was then negotiated under direct visualization down into the more distal brachial artery and the micro-sheath was then upsized to a 5 [place] sheath.  [place] core wire and Kumpe catheter were then utilized to first select the common ulnar and subsequently the proper ulnar artery.  Hand-injection contrast was then used to demonstrate the distal ulnar artery.  This represents second-order catheter placement.  Once hand-injection and confirmed positioning within the ulnar artery the [place] core wire was reintroduced.    The venous sheath was then upsized to a 5 [place] sheath and using a combination of a Kumpe catheter and a advantage wire the wire catheter combination was negotiated down into the ulnar vein.  This was accomplished utilizing multiple hand injections to slowly map your way down to this level.  This is required as the contrast will not reflux distally to a competent valve.  Once the 2 wires had been advanced side-by-side the detector was moved across the field with fluoroscopy to choose the view demonstrating the maximal separation between the wires.  Arteriography was now performed by hand in a magnified image centered over the  common ulnar artery, the [place]Q arterial catheter was advanced down to the level of the common ulnar artery and positioned so that the ceramic plate was facing the venous wire.  The [place]Q venous catheter was then advanced through the 5  French sheath and positioned opposite to the arterial catheter.  The catheters were then visualized in a meticulous fashion to ensure there was no wire wrap that the electrode is facing the ceramic plate and that the magnets are lined up and separated by no greater than 2 mm.  This required advancing both the venous and arterial catheters into the proper ulnar artery at which point appropriate spacing and apposition was achieved.  This was accomplished in multiple different views.  We then returned to the working view that was initially determined as noted above.  Both the arterial and venous wires were then pulled back proximal to the catheters.   Once the catheter position had been verified the RF pulse was delivered the electrode was visually noted to touch the ceramic plate.  A second RF pulse was delivered to ensure coaptation.  Catheter and wire were removed from the arterial sheath.  Hand-injection through the arterial sheath confirmed successful fistula creation.  The venous sheath was then readdressed hand-injection of contrast and a magnified view was used to demonstrate the brachial vein anatomy.  Subsequently, 2 Ruby coils (a 5 x 30 pod followed by a 30 cm packing coil) were deployed successfully occluding the brachial vein and forcing maximal flow through the fistula  Final fistulogram was then performed confirming rapid flow of contrast through the arteriovenous fistula with filling of the superficial system via a large perforator.  The superficial system was filling both with the basilic as well as the cephalic.  Distal arterial flow was preserved.  Occlusion of the basilic vein at the level of the coils was also noted.  Both sheaths were then removed and manual pressure was held for 20 minutes.   Disposition: Patient was taken to the recovery room in stable condition having tolerated the procedure well.  Earl Lites Montzerrat Brunell 07/30/2018,10:08 AM

## 2018-07-30 NOTE — Anesthesia Preprocedure Evaluation (Signed)
Anesthesia Evaluation  Patient identified by MRN, date of birth, ID band Patient awake    Reviewed: Allergy & Precautions, H&P , NPO status , Patient's Chart, lab work & pertinent test results, reviewed documented beta blocker date and time   History of Anesthesia Complications (+) PROLONGED EMERGENCE and history of anesthetic complications  Airway Mallampati: Trach  TM Distance: >3 FB Neck ROM: full    Dental  (+) Missing, Dental Advidsory Given, Teeth Intact   Pulmonary neg shortness of breath, sleep apnea , COPD,  COPD inhaler, neg recent URI, former smoker,           Cardiovascular Exercise Tolerance: Good hypertension, (-) angina+CHF  (-) CAD, (-) Past MI, (-) Cardiac Stents and (-) CABG (-) dysrhythmias (-) Valvular Problems/Murmurs     Neuro/Psych PSYCHIATRIC DISORDERS Depression negative neurological ROS     GI/Hepatic Neg liver ROS, GERD  ,  Endo/Other  diabetesMorbid obesity  Renal/GU CRFRenal disease  negative genitourinary   Musculoskeletal   Abdominal   Peds  Hematology negative hematology ROS (+)   Anesthesia Other Findings Past Medical History: No date: Anemia No date: Chronic kidney disease     Comment:  stage 4 No date: COPD (chronic obstructive pulmonary disease) ([facility]) No date: Depression No date: Diabetes (HCC) No date: GERD (gastroesophageal reflux disease) No date: Hyperlipidemia No date: Hypertension No date: MRSA (methicillin resistant Staphylococcus aureus)     Comment:  pt states had in past No date: Sleep apnea   Reproductive/Obstetrics negative OB ROS                             Anesthesia Physical Anesthesia Plan  ASA: III  Anesthesia Plan: General   Post-op Pain Management:    Induction: Intravenous  PONV Risk Score and Plan: 2 and Ondansetron, Dexamethasone and Treatment may vary due to age or medical condition  Airway Management Planned:  Tracheostomy  Additional Equipment:   Intra-op Plan:   Post-operative Plan:   Informed Consent: I have reviewed the patients History and Physical, chart, labs and discussed the procedure including the risks, benefits and alternatives for the proposed anesthesia with the patient or authorized representative who has indicated his/her understanding and acceptance.   Dental Advisory Given  Plan Discussed with: Anesthesiologist, CRNA and Surgeon  Anesthesia Plan Comments:         Anesthesia Quick Evaluation

## 2018-07-30 NOTE — Transfer of Care (Signed)
Immediate Anesthesia Transfer of Care Note  Patient: Matthew Mayer  Procedure(s) Performed: AV FISTULA INSERTION W/RF MAGNETIC GUIDANCE (Left )  Patient Location: PACU  Anesthesia Type:General  Level of Consciousness: awake, alert  and responds to stimulation  Airway & Oxygen Therapy: Patient Spontanous Breathing and Patient connected to tracheostomy mask oxygen  Post-op Assessment: Report given to RN and Post -op Vital signs reviewed and stable  Post vital signs: Reviewed and stable  Last Vitals:  Vitals Value Taken Time  BP 120/56 07/30/2018 10:05 AM  Temp    Pulse 64 07/30/2018 10:05 AM  Resp 13 07/30/2018 10:05 AM  SpO2 96 % 07/30/2018 10:05 AM  Vitals shown include unvalidated device data.  Last Pain:  Vitals:   07/30/18 0623  TempSrc: Oral  PainSc: 0-No pain         Complications: No apparent anesthesia complications

## 2018-07-30 NOTE — Anesthesia Post-op Follow-up Note (Signed)
Anesthesia QCDR form completed.        

## 2018-07-31 ENCOUNTER — Encounter: Payer: Self-pay | Admitting: Vascular Surgery

## 2018-08-01 ENCOUNTER — Encounter: Payer: Self-pay | Admitting: Internal Medicine

## 2018-08-01 ENCOUNTER — Inpatient Hospital Stay
Admission: EM | Admit: 2018-08-01 | Discharge: 2018-08-24 | DRG: 870 | Disposition: E | Payer: BLUE CROSS/BLUE SHIELD | Attending: Internal Medicine | Admitting: Internal Medicine

## 2018-08-01 ENCOUNTER — Emergency Department: Payer: BLUE CROSS/BLUE SHIELD

## 2018-08-01 ENCOUNTER — Other Ambulatory Visit: Payer: Self-pay

## 2018-08-01 DIAGNOSIS — Z930 Tracheostomy status: Secondary | ICD-10-CM

## 2018-08-01 DIAGNOSIS — Z8249 Family history of ischemic heart disease and other diseases of the circulatory system: Secondary | ICD-10-CM

## 2018-08-01 DIAGNOSIS — F329 Major depressive disorder, single episode, unspecified: Secondary | ICD-10-CM | POA: Diagnosis present

## 2018-08-01 DIAGNOSIS — I69921 Dysphasia following unspecified cerebrovascular disease: Secondary | ICD-10-CM | POA: Diagnosis not present

## 2018-08-01 DIAGNOSIS — Z79890 Hormone replacement therapy (postmenopausal): Secondary | ICD-10-CM

## 2018-08-01 DIAGNOSIS — J9600 Acute respiratory failure, unspecified whether with hypoxia or hypercapnia: Secondary | ICD-10-CM | POA: Diagnosis not present

## 2018-08-01 DIAGNOSIS — J441 Chronic obstructive pulmonary disease with (acute) exacerbation: Secondary | ICD-10-CM | POA: Diagnosis present

## 2018-08-01 DIAGNOSIS — K219 Gastro-esophageal reflux disease without esophagitis: Secondary | ICD-10-CM | POA: Diagnosis present

## 2018-08-01 DIAGNOSIS — A419 Sepsis, unspecified organism: Secondary | ICD-10-CM | POA: Diagnosis present

## 2018-08-01 DIAGNOSIS — Z881 Allergy status to other antibiotic agents status: Secondary | ICD-10-CM

## 2018-08-01 DIAGNOSIS — J9602 Acute respiratory failure with hypercapnia: Secondary | ICD-10-CM | POA: Diagnosis not present

## 2018-08-01 DIAGNOSIS — Z7189 Other specified counseling: Secondary | ICD-10-CM | POA: Diagnosis not present

## 2018-08-01 DIAGNOSIS — E1122 Type 2 diabetes mellitus with diabetic chronic kidney disease: Secondary | ICD-10-CM | POA: Diagnosis present

## 2018-08-01 DIAGNOSIS — J9622 Acute and chronic respiratory failure with hypercapnia: Secondary | ICD-10-CM | POA: Diagnosis present

## 2018-08-01 DIAGNOSIS — E11649 Type 2 diabetes mellitus with hypoglycemia without coma: Secondary | ICD-10-CM | POA: Diagnosis present

## 2018-08-01 DIAGNOSIS — Z823 Family history of stroke: Secondary | ICD-10-CM

## 2018-08-01 DIAGNOSIS — Z87892 Personal history of anaphylaxis: Secondary | ICD-10-CM

## 2018-08-01 DIAGNOSIS — I10 Essential (primary) hypertension: Secondary | ICD-10-CM

## 2018-08-01 DIAGNOSIS — Z836 Family history of other diseases of the respiratory system: Secondary | ICD-10-CM

## 2018-08-01 DIAGNOSIS — R627 Adult failure to thrive: Secondary | ICD-10-CM | POA: Diagnosis present

## 2018-08-01 DIAGNOSIS — E870 Hyperosmolality and hypernatremia: Secondary | ICD-10-CM | POA: Diagnosis not present

## 2018-08-01 DIAGNOSIS — Z9049 Acquired absence of other specified parts of digestive tract: Secondary | ICD-10-CM

## 2018-08-01 DIAGNOSIS — J9601 Acute respiratory failure with hypoxia: Secondary | ICD-10-CM | POA: Diagnosis not present

## 2018-08-01 DIAGNOSIS — J386 Stenosis of larynx: Secondary | ICD-10-CM | POA: Diagnosis present

## 2018-08-01 DIAGNOSIS — I130 Hypertensive heart and chronic kidney disease with heart failure and stage 1 through stage 4 chronic kidney disease, or unspecified chronic kidney disease: Secondary | ICD-10-CM | POA: Diagnosis present

## 2018-08-01 DIAGNOSIS — J189 Pneumonia, unspecified organism: Secondary | ICD-10-CM | POA: Diagnosis present

## 2018-08-01 DIAGNOSIS — Z8262 Family history of osteoporosis: Secondary | ICD-10-CM

## 2018-08-01 DIAGNOSIS — G4733 Obstructive sleep apnea (adult) (pediatric): Secondary | ICD-10-CM | POA: Diagnosis present

## 2018-08-01 DIAGNOSIS — E162 Hypoglycemia, unspecified: Secondary | ICD-10-CM

## 2018-08-01 DIAGNOSIS — D631 Anemia in chronic kidney disease: Secondary | ICD-10-CM | POA: Diagnosis present

## 2018-08-01 DIAGNOSIS — Z66 Do not resuscitate: Secondary | ICD-10-CM | POA: Diagnosis not present

## 2018-08-01 DIAGNOSIS — Z822 Family history of deafness and hearing loss: Secondary | ICD-10-CM

## 2018-08-01 DIAGNOSIS — N179 Acute kidney failure, unspecified: Secondary | ICD-10-CM | POA: Diagnosis present

## 2018-08-01 DIAGNOSIS — R319 Hematuria, unspecified: Secondary | ICD-10-CM | POA: Diagnosis not present

## 2018-08-01 DIAGNOSIS — N184 Chronic kidney disease, stage 4 (severe): Secondary | ICD-10-CM | POA: Diagnosis present

## 2018-08-01 DIAGNOSIS — G9341 Metabolic encephalopathy: Secondary | ICD-10-CM | POA: Diagnosis present

## 2018-08-01 DIAGNOSIS — J9621 Acute and chronic respiratory failure with hypoxia: Secondary | ICD-10-CM | POA: Diagnosis not present

## 2018-08-01 DIAGNOSIS — Z885 Allergy status to narcotic agent status: Secondary | ICD-10-CM

## 2018-08-01 DIAGNOSIS — E785 Hyperlipidemia, unspecified: Secondary | ICD-10-CM | POA: Diagnosis present

## 2018-08-01 DIAGNOSIS — I4581 Long QT syndrome: Secondary | ICD-10-CM | POA: Diagnosis present

## 2018-08-01 DIAGNOSIS — Z6839 Body mass index (BMI) 39.0-39.9, adult: Secondary | ICD-10-CM

## 2018-08-01 DIAGNOSIS — Z515 Encounter for palliative care: Secondary | ICD-10-CM

## 2018-08-01 DIAGNOSIS — R6520 Severe sepsis without septic shock: Secondary | ICD-10-CM | POA: Diagnosis present

## 2018-08-01 DIAGNOSIS — Z880 Allergy status to penicillin: Secondary | ICD-10-CM

## 2018-08-01 DIAGNOSIS — R601 Generalized edema: Secondary | ICD-10-CM | POA: Diagnosis not present

## 2018-08-01 DIAGNOSIS — J440 Chronic obstructive pulmonary disease with acute lower respiratory infection: Secondary | ICD-10-CM | POA: Diagnosis present

## 2018-08-01 DIAGNOSIS — G931 Anoxic brain damage, not elsewhere classified: Secondary | ICD-10-CM | POA: Diagnosis present

## 2018-08-01 DIAGNOSIS — Z79899 Other long term (current) drug therapy: Secondary | ICD-10-CM

## 2018-08-01 DIAGNOSIS — R4182 Altered mental status, unspecified: Secondary | ICD-10-CM

## 2018-08-01 DIAGNOSIS — Z9981 Dependence on supplemental oxygen: Secondary | ICD-10-CM

## 2018-08-01 DIAGNOSIS — Z794 Long term (current) use of insulin: Secondary | ICD-10-CM

## 2018-08-01 DIAGNOSIS — R609 Edema, unspecified: Secondary | ICD-10-CM

## 2018-08-01 DIAGNOSIS — Z4659 Encounter for fitting and adjustment of other gastrointestinal appliance and device: Secondary | ICD-10-CM

## 2018-08-01 DIAGNOSIS — I5033 Acute on chronic diastolic (congestive) heart failure: Secondary | ICD-10-CM | POA: Diagnosis present

## 2018-08-01 DIAGNOSIS — Z8349 Family history of other endocrine, nutritional and metabolic diseases: Secondary | ICD-10-CM

## 2018-08-01 DIAGNOSIS — E1165 Type 2 diabetes mellitus with hyperglycemia: Secondary | ICD-10-CM | POA: Diagnosis not present

## 2018-08-01 DIAGNOSIS — G9340 Encephalopathy, unspecified: Secondary | ICD-10-CM | POA: Diagnosis not present

## 2018-08-01 DIAGNOSIS — I161 Hypertensive emergency: Secondary | ICD-10-CM | POA: Diagnosis not present

## 2018-08-01 DIAGNOSIS — Z8614 Personal history of Methicillin resistant Staphylococcus aureus infection: Secondary | ICD-10-CM

## 2018-08-01 DIAGNOSIS — D509 Iron deficiency anemia, unspecified: Secondary | ICD-10-CM | POA: Diagnosis present

## 2018-08-01 DIAGNOSIS — Z833 Family history of diabetes mellitus: Secondary | ICD-10-CM

## 2018-08-01 DIAGNOSIS — Z8371 Family history of colonic polyps: Secondary | ICD-10-CM

## 2018-08-01 DIAGNOSIS — Z87891 Personal history of nicotine dependence: Secondary | ICD-10-CM

## 2018-08-01 DIAGNOSIS — R0602 Shortness of breath: Secondary | ICD-10-CM

## 2018-08-01 DIAGNOSIS — I160 Hypertensive urgency: Secondary | ICD-10-CM

## 2018-08-01 LAB — COMPREHENSIVE METABOLIC PANEL
ALT: 17 U/L (ref 0–44)
ANION GAP: 11 (ref 5–15)
AST: 30 U/L (ref 15–41)
Albumin: 2.5 g/dL — ABNORMAL LOW (ref 3.5–5.0)
Alkaline Phosphatase: 124 U/L (ref 38–126)
BUN: 37 mg/dL — AB (ref 6–20)
CALCIUM: 8 mg/dL — AB (ref 8.9–10.3)
CHLORIDE: 108 mmol/L (ref 98–111)
CO2: 20 mmol/L — ABNORMAL LOW (ref 22–32)
CREATININE: 3.94 mg/dL — AB (ref 0.61–1.24)
GFR calc non Af Amer: 16 mL/min — ABNORMAL LOW (ref >60)
GFR, EST AFRICAN AMERICAN: 18 mL/min — AB (ref >60)
Glucose, Bld: 143 mg/dL — ABNORMAL HIGH (ref 70–99)
POTASSIUM: 3.3 mmol/L — AB (ref 3.5–5.1)
SODIUM: 139 mmol/L (ref 135–145)
Total Bilirubin: 0.7 mg/dL (ref 0.3–1.2)
Total Protein: 7.7 g/dL (ref 6.5–8.1)

## 2018-08-01 LAB — BLOOD GAS, VENOUS
Acid-base deficit: 7.4 mmol/L — ABNORMAL HIGH (ref 0.0–2.0)
BICARBONATE: 20.1 mmol/L (ref 20.0–28.0)
FIO2: 0.35
O2 Saturation: 47.4 %
PATIENT TEMPERATURE: 37
pCO2, Ven: 49 mmHg (ref 44.0–60.0)
pH, Ven: 7.22 — ABNORMAL LOW (ref 7.250–7.430)
pO2, Ven: 32 mmHg (ref 32.0–45.0)

## 2018-08-01 LAB — URINALYSIS, COMPLETE (UACMP) WITH MICROSCOPIC
Bilirubin Urine: NEGATIVE
KETONES UR: 5 mg/dL — AB
Leukocytes, UA: NEGATIVE
Nitrite: NEGATIVE
PH: 5 (ref 5.0–8.0)
PROTEIN: 100 mg/dL — AB
Specific Gravity, Urine: 1.01 (ref 1.005–1.030)

## 2018-08-01 LAB — GLUCOSE, CAPILLARY
GLUCOSE-CAPILLARY: 147 mg/dL — AB (ref 70–99)
GLUCOSE-CAPILLARY: 166 mg/dL — AB (ref 70–99)
GLUCOSE-CAPILLARY: 167 mg/dL — AB (ref 70–99)
Glucose-Capillary: 110 mg/dL — ABNORMAL HIGH (ref 70–99)
Glucose-Capillary: 138 mg/dL — ABNORMAL HIGH (ref 70–99)

## 2018-08-01 LAB — CBC WITH DIFFERENTIAL/PLATELET
ABS IMMATURE GRANULOCYTES: 0.15 10*3/uL — AB (ref 0.00–0.07)
BASOS PCT: 0 %
Basophils Absolute: 0 10*3/uL (ref 0.0–0.1)
EOS ABS: 0 10*3/uL (ref 0.0–0.5)
EOS PCT: 0 %
HCT: 32.1 % — ABNORMAL LOW (ref 39.0–52.0)
Hemoglobin: 9.7 g/dL — ABNORMAL LOW (ref 13.0–17.0)
Immature Granulocytes: 1 %
Lymphocytes Relative: 5 %
Lymphs Abs: 0.8 10*3/uL (ref 0.7–4.0)
MCH: 26.1 pg (ref 26.0–34.0)
MCHC: 30.2 g/dL (ref 30.0–36.0)
MCV: 86.5 fL (ref 80.0–100.0)
Monocytes Absolute: 0.4 10*3/uL (ref 0.1–1.0)
Monocytes Relative: 2 %
NEUTROS ABS: 16.9 10*3/uL — AB (ref 1.7–7.7)
Neutrophils Relative %: 92 %
PLATELETS: 291 10*3/uL (ref 150–400)
RBC: 3.71 MIL/uL — AB (ref 4.22–5.81)
RDW: 15.8 % — AB (ref 11.5–15.5)
WBC: 18.4 10*3/uL — AB (ref 4.0–10.5)
nRBC: 0 % (ref 0.0–0.2)

## 2018-08-01 LAB — MRSA PCR SCREENING: MRSA by PCR: NEGATIVE

## 2018-08-01 LAB — LACTIC ACID, PLASMA
LACTIC ACID, VENOUS: 0.9 mmol/L (ref 0.5–1.9)
LACTIC ACID, VENOUS: 0.7 mmol/L (ref 0.5–1.9)

## 2018-08-01 LAB — BLOOD GAS, ARTERIAL
Acid-base deficit: 7.4 mmol/L — ABNORMAL HIGH (ref 0.0–2.0)
Bicarbonate: 18.1 mmol/L — ABNORMAL LOW (ref 20.0–28.0)
FIO2: 0.4
O2 SAT: 96.6 %
Patient temperature: 37
Pressure support: 20 cmH2O
pCO2 arterial: 36 mmHg (ref 32.0–48.0)
pH, Arterial: 7.31 — ABNORMAL LOW (ref 7.350–7.450)
pO2, Arterial: 95 mmHg (ref 83.0–108.0)

## 2018-08-01 LAB — BRAIN NATRIURETIC PEPTIDE: B Natriuretic Peptide: 448 pg/mL — ABNORMAL HIGH (ref 0.0–100.0)

## 2018-08-01 LAB — AMMONIA: AMMONIA: 19 umol/L (ref 9–35)

## 2018-08-01 LAB — LIPASE, BLOOD: Lipase: 35 U/L (ref 11–51)

## 2018-08-01 LAB — TROPONIN I: TROPONIN I: 0.03 ng/mL — AB (ref <0.03)

## 2018-08-01 MED ORDER — MIDAZOLAM HCL 2 MG/2ML IJ SOLN: 2.0000 mg | INTRAMUSCULAR | Status: DC | PRN

## 2018-08-01 MED ORDER — VANCOMYCIN HCL IN DEXTROSE 1-5 GM/200ML-% IV SOLN
1000.0000 mg | Freq: Once | INTRAVENOUS | Status: AC
  Administered 2018-08-01: 1000 mg via INTRAVENOUS
  Filled 2018-08-01: qty 200

## 2018-08-01 MED ORDER — METRONIDAZOLE IN NACL 5-0.79 MG/ML-% IV SOLN
500.0000 mg | Freq: Three times a day (TID) | INTRAVENOUS | Status: DC
  Administered 2018-08-01 – 2018-08-02 (×3): 500 mg via INTRAVENOUS
  Filled 2018-08-01 (×5): qty 100

## 2018-08-01 MED ORDER — ALBUTEROL SULFATE (2.5 MG/3ML) 0.083% IN NEBU: 2.5000 mg | INHALATION_SOLUTION | RESPIRATORY_TRACT | Status: DC | PRN

## 2018-08-01 MED ORDER — SODIUM CHLORIDE 0.9 % IV SOLN
2.0000 g | Freq: Once | INTRAVENOUS | Status: AC
  Administered 2018-08-01 (×2): 2 g via INTRAVENOUS
  Filled 2018-08-01: qty 2

## 2018-08-01 MED ORDER — SODIUM BICARBONATE 8.4 % IV SOLN
150.0000 meq | Freq: Once | INTRAVENOUS | Status: AC
  Administered 2018-08-01: 150 meq via INTRAVENOUS
  Filled 2018-08-01: qty 150

## 2018-08-01 MED ORDER — ONDANSETRON HCL 4 MG PO TABS: 4.0000 mg | ORAL_TABLET | Freq: Four times a day (QID) | ORAL | Status: DC | PRN

## 2018-08-01 MED ORDER — AZTREONAM 1 G IJ SOLR
1.0000 g | Freq: Three times a day (TID) | INTRAMUSCULAR | Status: DC
  Filled 2018-08-01: qty 1

## 2018-08-01 MED ORDER — ACETAMINOPHEN 325 MG PO TABS
650.0000 mg | ORAL_TABLET | Freq: Four times a day (QID) | ORAL | Status: DC | PRN
  Administered 2018-08-03 (×2): 650 mg via ORAL
  Filled 2018-08-01 (×2): qty 2

## 2018-08-01 MED ORDER — FUROSEMIDE 10 MG/ML IJ SOLN
80.0000 mg | INTRAMUSCULAR | Status: AC
  Administered 2018-08-01: 80 mg via INTRAVENOUS

## 2018-08-01 MED ORDER — DEXTROSE 10 % IV SOLN: INTRAVENOUS | Status: DC

## 2018-08-01 MED ORDER — VANCOMYCIN HCL 10 G IV SOLR
1500.0000 mg | INTRAVENOUS | Status: DC
  Administered 2018-08-02: 1500 mg via INTRAVENOUS
  Filled 2018-08-01: qty 1500

## 2018-08-01 MED ORDER — FUROSEMIDE 10 MG/ML IJ SOLN
INTRAMUSCULAR | Status: AC
  Administered 2018-08-01: 80 mg via INTRAVENOUS
  Filled 2018-08-01: qty 8

## 2018-08-01 MED ORDER — FENTANYL CITRATE (PF) 100 MCG/2ML IJ SOLN: 100.0000 ug | INTRAMUSCULAR | Status: DC | PRN

## 2018-08-01 MED ORDER — LABETALOL HCL 5 MG/ML IV SOLN
20.0000 mg | Freq: Once | INTRAVENOUS | Status: DC
  Filled 2018-08-01: qty 4

## 2018-08-01 MED ORDER — ACETAMINOPHEN 650 MG RE SUPP: 650.0000 mg | Freq: Four times a day (QID) | RECTAL | Status: DC | PRN

## 2018-08-01 MED ORDER — ONDANSETRON HCL 4 MG/2ML IJ SOLN: 4.0000 mg | Freq: Four times a day (QID) | INTRAMUSCULAR | Status: DC | PRN

## 2018-08-01 MED ORDER — MIDAZOLAM HCL 2 MG/2ML IJ SOLN
1.0000 mg | Freq: Once | INTRAMUSCULAR | Status: AC
  Administered 2018-08-01: 1 mg via INTRAVENOUS

## 2018-08-01 MED ORDER — BISACODYL 10 MG RE SUPP
10.0000 mg | Freq: Every day | RECTAL | Status: DC | PRN
  Filled 2018-08-01: qty 1

## 2018-08-01 MED ORDER — NITROGLYCERIN IN D5W 200-5 MCG/ML-% IV SOLN
0.0000 ug/min | INTRAVENOUS | Status: DC
  Administered 2018-08-01 – 2018-08-03 (×2): 5 ug/min via INTRAVENOUS
  Filled 2018-08-01 (×2): qty 250

## 2018-08-01 MED ORDER — SENNOSIDES 8.8 MG/5ML PO SYRP
5.0000 mL | ORAL_SOLUTION | Freq: Two times a day (BID) | ORAL | Status: DC | PRN
  Filled 2018-08-01: qty 5

## 2018-08-01 MED ORDER — SODIUM CHLORIDE 0.9% FLUSH
3.0000 mL | Freq: Two times a day (BID) | INTRAVENOUS | Status: DC
  Administered 2018-08-01 – 2018-08-05 (×7): 3 mL via INTRAVENOUS
  Administered 2018-08-05: 6 mL via INTRAVENOUS
  Administered 2018-08-06 – 2018-08-13 (×15): 3 mL via INTRAVENOUS

## 2018-08-01 MED ORDER — FENTANYL CITRATE (PF) 100 MCG/2ML IJ SOLN
100.0000 ug | INTRAMUSCULAR | Status: DC | PRN
  Administered 2018-08-01 – 2018-08-02 (×2): 100 ug via INTRAVENOUS
  Filled 2018-08-01 (×2): qty 2

## 2018-08-01 MED ORDER — FUROSEMIDE 10 MG/ML IJ SOLN: 100.0000 mg | Freq: Once | INTRAVENOUS | Status: DC

## 2018-08-01 MED ORDER — MIDAZOLAM HCL 2 MG/2ML IJ SOLN
INTRAMUSCULAR | Status: AC
  Administered 2018-08-01: 1 mg via INTRAVENOUS
  Filled 2018-08-01: qty 2

## 2018-08-01 MED ORDER — HEPARIN SODIUM (PORCINE) 5000 UNIT/ML IJ SOLN
5000.0000 [IU] | Freq: Three times a day (TID) | INTRAMUSCULAR | Status: DC
  Administered 2018-08-01 – 2018-08-11 (×29): 5000 [IU] via SUBCUTANEOUS
  Filled 2018-08-01 (×29): qty 1

## 2018-08-01 MED ORDER — HYDRALAZINE HCL 20 MG/ML IJ SOLN
10.0000 mg | Freq: Four times a day (QID) | INTRAMUSCULAR | Status: DC | PRN
  Administered 2018-08-01 – 2018-08-03 (×6): 10 mg via INTRAVENOUS
  Filled 2018-08-01 (×6): qty 1

## 2018-08-01 NOTE — Consult Note (Signed)
PULMONARY / CRITICAL CARE MEDICINE  Name: Matthew Mayer MRN: [id number] DOB: 07/15/1961    LOS: 1  Referring Provider:  Dr Elpidio Anis Reason for Referral: Acute change in mental status, and respiratory failure Brief patient description: 57 year old male with severe COPD, vocal cord paralysis, CKD status post AV fistula 2 days ago and chronic trach found unresponsive, hypoglycemic and hypoxic.  Unknown downtime.  Febrile upon ED presentation with leukocytosis, ruled in for sepsis of unknown source- pulmonary versus recently placed AV fistula.  Also found to be in hypertensive emergency.  Admitted to the ICU for vent support   SIGNIFICANT EVENTS: 07/30/2018: AV fistula placed in LU arm 2018/08/07: admitted with sepsis, hypoglycemia, acute encephalopathy and respiratory failure 08/02/2018: Remains on full vent support  HPI: This is a 57 year old male with a past medical history as indicated below who presented to the ED after being found unresponsive.  Of note, patient had an AV fistula placed 2 days ago [07/30/2018]  in preparation for hemodialysis down the road.  He has a history of CKD and is being seen by nephrology.  History is obtained from EMS and ED records as patient is currently on full vent support and unresponsive.  Per ED records, EMS was called after shunt was found unresponsive at home.  When EMS arrived, his blood sugar was 45 and spouse reported that patient had a seizure-like activity and EMS noted posturing.  They administered some dextrose and Versed and his blood sugar came up to 200s.  Upon arrival in the ED, his blood sugar was down to 135.  His SPO2 upon EMS arrival was in the 70s and he was off his oxygen.  He uses oxygen via tracheostomy around-the-clock at home. His ED work-up included a CT head that was negative.  His chest x-ray showed bilateral pulmonary infiltrates with concern for pneumonia versus pulmonary edema, leukocytosis with WBC of 18.4K, creatinine of 3.94, and a  potassium level of 3.3.  He was ruled in for sepsis, respiratory failure, altered mental status and hypoglycemia.  He is being admitted to the ICU for further management.  He remains unresponsive but will withdraw to pain.  He has been placed on full vent support. Per patient's wife, he has had diarrhea for the last week and was not eating or drinking much.  Past Medical History:  Diagnosis Date  . Anemia   . Chronic kidney disease    stage 4  . COPD (chronic obstructive pulmonary disease) (HCC)   . Depression   . Diabetes (HCC)   . GERD (gastroesophageal reflux disease)   . Hyperlipidemia   . Hypertension   . MRSA (methicillin resistant Staphylococcus aureus)    pt states had in past  . Sleep apnea    Past Surgical History:  Procedure Laterality Date  . AV FISTULA INSERTION W/ RF MAGNETIC GUIDANCE Left 07/30/2018   Procedure: AV FISTULA INSERTION W/RF MAGNETIC GUIDANCE;  Surgeon: Renford Dills, MD;  Location: [facility] INVASIVE CV LAB;  Service: Cardiovascular;  Laterality: Left;  . CHOLECYSTECTOMY    . EXPLORATORY LAPAROTOMY    . Hand ganglion cyst Left   . laryngoscopy w/excision &/or aspiration  12/04/2013  . TRACHEOSTOMY     Prior to Admission medications   Medication Sig Start Date End Date Taking? Authorizing Provider  amLODipine (NORVASC) 5 MG tablet Take 5 mg by mouth daily.   Yes [provider]  clopidogrel (PLAVIX) 75 MG tablet Take 75 mg by mouth daily.   Yes [provider]  donepezil (ARICEPT) 5 MG tablet Take 1 tablet (5 mg total) by mouth at bedtime. 05/19/18 06/28/18 Yes Sowles, Danna Hefty, MD  empagliflozin (JARDIANCE) 25 MG TABS tablet Take 25 mg by mouth daily.   Yes [provider]  glycopyrrolate (ROBINUL) 1 MG tablet Take 1 mg by mouth 2 (two) times daily.   Yes [provider]  insulin aspart (NOVOLOG FLEXPEN) 100 UNIT/ML FlexPen Inject 12 Units into the skin 2 (two) times daily.   Yes [provider]  insulin aspart  (NOVOLOG) 100 UNIT/ML FlexPen Inject 18 Units into the skin daily. At 1700   Yes [provider]  Insulin Degludec-Liraglutide (XULTOPHY) 100-3.6 UNIT-MG/ML SOPN Inject 50 Units into the skin daily.   Yes [provider]  levETIRAcetam (KEPPRA) 500 MG tablet Take 500 mg by mouth 2 (two) times daily.   Yes [provider]  lipase/protease/amylase (CREON) 12000 units CPEP capsule Take 6,000 Units by mouth 3 (three) times daily before meals.   Yes [provider]  lipase/protease/amylase (CREON) 12000 units CPEP capsule Take 3,000 Units by mouth at bedtime. With snack   Yes [provider]  lisinopril (PRINIVIL,ZESTRIL) 5 MG tablet Take 5 mg by mouth daily.   Yes [provider]  metoprolol succinate (TOPROL-XL) 25 MG 24 hr tablet Take 1 tablet (25 mg total) by mouth daily. 05/19/18  Yes Sowles, Danna Hefty, MD  rosuvastatin (CRESTOR) 40 MG tablet Take 1 tablet (40 mg total) by mouth daily. 05/19/18 06/28/18 Yes Alba Cory, MD  aspirin EC 81 MG tablet Take 81 mg by mouth daily.    [provider]  famotidine (PEPCID) 20 MG tablet Take 1 tablet (20 mg total) by mouth 2 (two) times daily. 05/19/18 06/18/18  Alba Cory, MD  gabapentin (NEURONTIN) 300 MG capsule Take 1 capsule (300 mg total) by mouth 2 (two) times daily. 05/19/18 06/18/18  Alba Cory, MD  insulin glargine (LANTUS) 100 UNIT/ML injection Inject 0.1 mLs (10 Units total) into the skin daily. 05/19/18 06/18/18  Alba Cory, MD  lacosamide 100 MG TABS Take 1 tablet (100 mg total) by mouth 2 (two) times daily. Patient not taking: Reported on 06/28/2018 10/04/17   Enedina Finner, MD  promethazine (PHENERGAN) 12.5 MG tablet Take 1 tablet (12.5 mg total) by mouth every 6 (six) hours as needed for nausea or vomiting. Patient not taking: Reported on 06/28/2018 07/23/17   Almond Lint, MD  sertraline (ZOLOFT) 25 MG tablet Take 1 tablet (25 mg total) by mouth daily. Patient not taking:  Reported on 06/28/2018 05/19/18   Alba Cory, MD   Allergies Allergies  Allergen Reactions  . Augmentin [Amoxicillin-Pot Clavulanate] Anaphylaxis  . Penicillins Anaphylaxis    Has patient had a PCN reaction causing immediate rash, facial/tongue/throat swelling, SOB or lightheadedness with hypotension: Yes Has patient had a PCN reaction causing severe rash involving mucus membranes or skin necrosis: No Has patient had a PCN reaction that required hospitalization: Yes Has patient had a PCN reaction occurring within the last 10 years: Yes If all of the above answers are "NO", then may proceed with Cephalosporin use.   . Codeine Itching  . Biaxin [Clarithromycin] Rash    Family History Family History  Problem Relation Age of Onset  . Coronary artery disease Mother   . Hypertension Mother   . Osteoporosis Mother   . Heart attack Mother   . Diabetes Father   . Hypertension Father   . CVA Father   . Stroke Father   .  Coronary artery disease Sister   . Diabetes Sister   . Hearing loss Sister   . Hyperlipidemia Sister   . Lung disease Sister   . Stroke Sister   . Colon polyps Brother   . Diabetes Brother   . Hearing loss Brother   . Hypertension Brother   . Cancer Maternal Grandmother   . Heart disease Maternal Grandfather   . Diabetes Paternal Grandmother   . Diabetes Paternal Grandfather    Social History  reports that he quit smoking about 5 years ago. His smoking use included cigarettes. He has a 114.00 pack-year smoking history. He has never used smokeless tobacco. He reports that he drank alcohol. He reports that he does not use drugs.  Review Of Systems: Unable to obtain as patient remains unresponsive  VITAL SIGNS: BP (!) 160/67   Pulse 80   Temp 99.5 F (37.5 C)   Resp 16   Ht 5\' 8"  (1.727 m)   Wt 118.5 kg   SpO2 96%   BMI 39.72 kg/m   HEMODYNAMICS:    VENTILATOR SETTINGS: Vent Mode: PRVC FiO2 (%):  [40 %] 40 % Set Rate:  [12 bmp] 12 bmp Vt Set:   [450 mL] 450 mL PEEP:  [5 cmH20] 5 cmH20 Pressure Support:  [20 cmH20] 20 cmH20 Plateau Pressure:  [14 cmH20-20 cmH20] 14 cmH20  INTAKE / OUTPUT: No intake/output data recorded.  PHYSICAL EXAMINATION: General: Chronically ill looking, in no distress HEENT: PERRLA, tracheostomy tube in place with mild serosanguineous drainage Neuro: Unresponsive to voice and touch, withdraws to noxious stimulus, lower extremities flexed Cardiovascular: Apical pulse regular, S1-S2, no murmur regurg or gallop, +2 pulses bilaterally, +1 edema bilaterally, left upper AV shunt with positive thrill on auscultation Lungs: Tracheostomy tube in place, bilateral breath sounds, diminished in the bases, with diffuse rhonchi in the bases Abdomen: Obese, positive bowel sounds in all 4 quadrants, palpation reveals no organomegaly. Musculoskeletal: Positive range of motion in upper and lower extremities no joint swelling Skin: Warm and dry  LABS:  BMET Recent Labs  Lab 08/07/2018 1730  NA 139  K 3.3*  CL 108  CO2 20*  BUN 37*  CREATININE 3.94*  GLUCOSE 143*    Electrolytes Recent Labs  Lab 08/04/2018 1730  CALCIUM 8.0*    CBC Recent Labs  Lab 07/28/18 0941 08/16/2018 1730  WBC 10.7* 18.4*  HGB 8.9* 9.7*  HCT 29.0* 32.1*  PLT 276 291    Coag's No results for input(s): APTT, INR in the last 168 hours.  Sepsis Markers Recent Labs  Lab 08/09/2018 1730 07/31/2018 1911  LATICACIDVEN 0.9 0.7    ABG Recent Labs  Lab 08/07/2018 2114  PHART 7.31*  PCO2ART 36  PO2ART 95    Liver Enzymes Recent Labs  Lab 08/17/2018 1730  AST 30  ALT 17  ALKPHOS 124  BILITOT 0.7  ALBUMIN 2.5*    Cardiac Enzymes Recent Labs  Lab 08/02/2018 1730  TROPONINI 0.03*    Glucose Recent Labs  Lab 07/30/2018 1914 08/12/2018 2100 08/03/2018 2204 08/14/2018 2307 08/22/2018 2357 08/02/18 0342  GLUCAP 110* 138* 147* 166* 167* 216*    Imaging Dg Chest 1 View  Result Date: 08/03/2018 CLINICAL DATA:  Altered mental  status. EXAM: CHEST  1 VIEW COMPARISON:  Radiographs of July 30, 2013. FINDINGS: Mild cardiomegaly is noted with increased diffuse interstitial densities throughout both lungs concerning for pulmonary edema or possibly inflammation. Tracheostomy tube is in grossly good position. No pneumothorax or pleural effusion is noted.  Bony thorax is unremarkable. IMPRESSION: Cardiomegaly is noted with diffuse interstitial densities throughout both lungs concerning for pulmonary edema or possibly inflammation. Electronically Signed   By: Lupita Raider, M.D.   On: 08/03/2018 18:07   Ct Head Wo Contrast  Result Date: 08/19/2018 CLINICAL DATA:  57 y/o M; found at home unconscious and unresponsive. Seizure-like activity. EXAM: CT HEAD WITHOUT CONTRAST CT CERVICAL SPINE WITHOUT CONTRAST TECHNIQUE: Multidetector CT imaging of the head and cervical spine was performed following the standard protocol without intravenous contrast. Multiplanar CT image reconstructions of the cervical spine were also generated. COMPARISON:  05/17/2013 CT head FINDINGS: CT HEAD FINDINGS Brain: No evidence of acute infarction, hemorrhage, hydrocephalus, extra-axial collection or mass lesion/mass effect. 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 Vascular: Calcific atherosclerosis of carotid siphons. No hyperdense vessel identified. Skull: Normal. Negative for fracture or focal lesion. Sinuses/Orbits: Mild paranasal sinus mucosal thickening. Normal aeration of mastoid air cells. Other: None. CT CERVICAL SPINE FINDINGS Alignment: Normal. Skull base and vertebrae: No acute fracture. No primary bone lesion or focal pathologic process. Soft tissues and spinal canal: No prevertebral fluid or swelling. No visible canal hematoma. Disc levels: Mild cervical spondylosis with discogenic degenerative changes greatest at the C6-7 level. No significant bony foraminal or canal stenosis. Upper chest: Negative. Other: Tracheostomy tube in  situ. Calcific atherosclerosis of carotid bifurcations. IMPRESSION: CT head: 1. No acute intracranial abnormality identified. 2. Mild paranasal sinus disease. CT cervical spine: 1. No acute fracture or dislocation identified. 2. Mild cervical spondylosis greatest at C6-7 level. 3. Calcific atherosclerosis of carotid bifurcations. Electronically Signed   By: Mitzi Hansen M.D.   On: 08/16/2018 18:12   Ct Cervical Spine Wo Contrast  Result Date: 08/07/2018 CLINICAL DATA:  57 y/o M; found at home unconscious and unresponsive. Seizure-like activity. EXAM: CT HEAD WITHOUT CONTRAST CT CERVICAL SPINE WITHOUT CONTRAST TECHNIQUE: Multidetector CT imaging of the head and cervical spine was performed following the standard protocol without intravenous contrast. Multiplanar CT image reconstructions of the cervical spine were also generated. COMPARISON:  05/17/2013 CT head FINDINGS: CT HEAD FINDINGS Brain: No evidence of acute infarction, hemorrhage, hydrocephalus, extra-axial collection or mass lesion/mass effect. 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 Vascular: Calcific atherosclerosis of carotid siphons. No hyperdense vessel identified. Skull: Normal. Negative for fracture or focal lesion. Sinuses/Orbits: Mild paranasal sinus mucosal thickening. Normal aeration of mastoid air cells. Other: None. CT CERVICAL SPINE FINDINGS Alignment: Normal. Skull base and vertebrae: No acute fracture. No primary bone lesion or focal pathologic process. Soft tissues and spinal canal: No prevertebral fluid or swelling. No visible canal hematoma. Disc levels: Mild cervical spondylosis with discogenic degenerative changes greatest at the C6-7 level. No significant bony foraminal or canal stenosis. Upper chest: Negative. Other: Tracheostomy tube in situ. Calcific atherosclerosis of carotid bifurcations. IMPRESSION: CT head: 1. No acute intracranial abnormality identified. 2. Mild paranasal sinus disease.  CT cervical spine: 1. No acute fracture or dislocation identified. 2. Mild cervical spondylosis greatest at C6-7 level. 3. Calcific atherosclerosis of carotid bifurcations. Electronically Signed   By: Mitzi Hansen M.D.   On: 08/10/2018 18:12   STUDIES:  EEG pending  CULTURES: Blood cultures x2 Urine culture  ANTIBIOTICS: 08/06/2018 Aztreonam> Vancomycin>  LINES/TUBES: Tracheostomy tube Peripheral IVs Foley catheter NG tube   DISCUSSION: 57 year old male presenting with sepsis of unknown source, acute encephalopathy, questionable seizure activity, hypoglycemia and acute hypoxic respiratory failure requiring full vent support  ASSESSMENT / PLAN:  PULMONARY A: Acute  hypoxic respiratory failure Acute pulmonary edema versus multifocal pneumonia, now with ARDS pattern on chest x-ray Chronic trach Acute on chronic COPD exacerbation Obstructive sleep apnea P:   Full vent support with current settings ABG and chest x-ray in the morning Nebulized bronchodilators and steroids Trach care per protocol VAP protocol Weaning trials as tolerated IV diuresis DC IV fluids  CARDIOVASCULAR A:  Hypertensive emergency P:  Nitroglycerin infusion to maintain systolic blood pressure between 140 and 160 mmHg IV diuretics  RENAL A:   CKD stage IV-baseline creatinine 3.4 P:   Nephrology consulted Trend creatinine Monitor and correct electrolytes  GASTROINTESTINAL A:   Reported diarrhea- no loose stools observed since admission P:   Continue to monitor Pepcid 20 mg IV every 12 for GI prophylaxis  HEMATOLOGIC A:   Anemia-combination of iron deficiency and chronic disease P:  Patient was receiving IV iron infusions as outpatient. Trend hemoglobin and hematocrit Transfuse for hemoglobin less than 7  INFECTIOUS A:   Sepsis of unknown source-pulmonary versus new AV shunt source P:   Blood cultures pending Empiric antibiotics as above Trend procalcitonin Monitor  fever curve  ENDOCRINE A:   Severe hypoglycemia Type 2 diabetes P:   Continue blood glucose monitoring every 4 hours and titrate D10 to keep blood glucose level greater than 100 mg/dL Hold all antidiabetic medications Hemoglobin A1c with morning labs  NEUROLOGIC A:   Acute encephalopathy-multifactorial: Sepsis, hypoxemia, and hypoglycemia Questionable seizure activity P:   RASS goal: 0 to -1 PRN Versed and fentanyl for vent discomfort and sedation Neurochecks protocol Neurology consulted, awaiting input EEG Seizure precautions Monitor and correct electrolytes  Best Practice: Code Status: Full code Diet: N.p.o. GI prophylaxis: Pepcid VTE prophylaxis: Subcu heparin  FAMILY  - Updates: Wife updated at bedside.  All questions answered  Remedy Corporan S. [facility] ANP-BC Pulmonary and Critical Care Medicine [facility] Pager [phone] or [phone]  NB: This document was prepared using Dragon voice recognition software and may include unintentional dictation errors.    08/02/2018, 4:59 AM

## 2018-08-01 NOTE — ED Provider Notes (Addendum)
[facility] Emergency Department Provider Note  ____________________________________________   First MD Initiated Contact with Patient 08/12/2018 1728     (approximate)  I have reviewed the triage vital signs and the nursing notes.   HISTORY  Chief Complaint Altered Mental Status and Seizures   HPI Matthew Mayer is a 57 y.o. male with a history of end-stage renal disease as well as diabetes with a recently placed left upper cavity fistula was presented to the emergency department altered mental status.  EMS reports that he was last seen normal this morning.  EMS reports that he usually sleeps all day and this is why family did not check on him sooner.  However, he was unable to be awoken and was found to have an initial glucose of 42.  When fire rescue arrived he was also with an oxygen saturation in the 70s and off of his home oxygen per EMS.  EMS reports that he is supposed to be on oxygen ATC via his tracheostomy.  EMS placed him on oxygen as well as gave him D10 which brought his glucose up to the 200s.  However, despite the increase in the glucose level the patient's mental status did not improve.  Patient was also noted by EMS to have posturing just in his feet and was given a dose of Versed.  He was witnessed to be plantarflexed.  No known seizure history.   Past Medical History:  Diagnosis Date  . Anemia   . Chronic kidney disease    stage 4  . COPD (chronic obstructive pulmonary disease) (HCC)   . Depression   . Diabetes (HCC)   . GERD (gastroesophageal reflux disease)   . Hyperlipidemia   . Hypertension   . MRSA (methicillin resistant Staphylococcus aureus)    pt states had in past  . Sleep apnea     Patient Active Problem List   Diagnosis Date Noted  . Iron deficiency anemia 07/24/2018  . Hypertriglyceridemia 01/27/2017  . Acute renal failure superimposed on stage 4 chronic kidney disease (HCC) 07/07/2016  . Chronic respiratory failure  with hypoxia and hypercapnia (HCC) 07/07/2016  . Acute pancreatitis 06/23/2016  . Stage 4 chronic kidney disease (HCC) 04/06/2015  . Chronic diastolic CHF (congestive heart failure), NYHA class 3 (HCC) 02/14/2015  . Benign essential hypertension 02/07/2015  . Depression 10/27/2014  . Type 2 diabetes mellitus with diabetic chronic kidney disease (HCC) 07/29/2014  . Chronic low back pain 07/23/2014  . Exertional chest pain 07/23/2014  . Insulin resistance 05/13/2014  . Long-term insulin use (HCC) 05/13/2014  . Bilateral edema of lower extremity 04/05/2014  . 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 GERD (gastroesophageal reflux disease) 11/27/2013  . Obesity, unspecified 11/27/2013  . Tracheostomy in place [facility]) 11/27/2013  . Vocal cord paralysis 08/12/2013  . COPD (chronic obstructive pulmonary disease) (HCC) 07/28/2013  . Tracheostomy status (HCC) 07/28/2013  . Nocturnal hypoxemia 07/28/2013  . HCAP (healthcare-associated pneumonia) 07/28/2013    Past Surgical History:  Procedure Laterality Date  . AV FISTULA INSERTION W/ RF MAGNETIC GUIDANCE Left 07/30/2018   Procedure: AV FISTULA INSERTION W/RF MAGNETIC GUIDANCE;  Surgeon: Renford Dills, MD;  Location: [facility] INVASIVE CV LAB;  Service: Cardiovascular;  Laterality: Left;  . CHOLECYSTECTOMY    . EXPLORATORY LAPAROTOMY    . Hand ganglion cyst Left   . laryngoscopy w/excision &/or aspiration  12/04/2013  . TRACHEOSTOMY      Prior to Admission medications   Medication Sig Start Date End Date  Taking? Authorizing Provider  albuterol (ACCUNEB) 1.25 MG/3ML nebulizer solution Inhale 1 ampule into the lungs every 6 (six) hours as needed for wheezing or shortness of breath.    Yes [provider]  arformoterol (BROVANA) 15 MCG/2ML NEBU Take 15 mcg by nebulization 2 (two) times daily.    Yes [provider]  calcitRIOL (ROCALTROL) 0.25 MCG capsule Take 0.25 mcg by mouth every evening.  04/15/18  Yes [provider]    carvedilol (COREG) 25 MG tablet Take 25 mg by mouth 2 (two) times daily with a meal.   Yes [provider]  clindamycin (CLEOCIN T) 1 % external solution Apply 1 application topically 2 (two) times daily as needed (rash).    Yes [provider]  Continuous Blood Gluc Sensor (FREESTYLE LIBRE SENSOR SYSTEM) MISC USE 1 EACH EVERY 10 (TEN) DAYS E11.65 02/05/18  Yes [provider]  cyclobenzaprine (FLEXERIL) 10 MG tablet Take 10 mg by mouth at bedtime.  12/09/17  Yes [provider]  docusate sodium (COLACE) 100 MG capsule Take 100 mg by mouth at bedtime.   Yes [provider]  esomeprazole (NEXIUM) 20 MG capsule Take 40 mg by mouth daily.    Yes [provider]  fenofibrate (TRICOR) 145 MG tablet Take 145 mg by mouth every evening.  12/06/17  Yes [provider]  furosemide (LASIX) 40 MG tablet Take 40 mg by mouth See admin instructions. Take 40 mg daily may take a second 40 mg dose as needed for swelling or weight gain 2lbs in 24 hours   Yes [provider]  HUMULIN R 500 UNIT/ML injection Inject 25 Units into the skin 3 (three) times daily with meals.    Yes [provider]  hydrocortisone valerate cream (WESTCORT) 0.2 % Apply 1 application topically 2 (two) times daily as needed (itching / rash).  10/15/17  Yes [provider]  hydrOXYzine (ATARAX/VISTARIL) 25 MG tablet Take 25 mg by mouth at bedtime.    Yes [provider]  lisinopril (PRINIVIL,ZESTRIL) 20 MG tablet Take 20 mg by mouth at bedtime.    Yes [provider]  Melatonin 10 MG TABS Take 20 mg by mouth at bedtime.   Yes [provider]  Multiple Vitamin (MULTIVITAMIN) tablet Take 1 tablet by mouth daily.   Yes [provider]  naproxen sodium (ALEVE) 220 MG tablet Take 220 mg by mouth 2 (two) times daily as needed (for pain).    Yes [provider]  oxyCODONE-acetaminophen (PERCOCET) 5-325 MG tablet Take 1-2  tablets by mouth every 6 (six) hours as needed for moderate pain or severe pain. 07/30/18 07/30/19 Yes Schnier, Latina Craver, MD  PARoxetine (PAXIL) 30 MG tablet Take 30 mg by mouth at bedtime.    Yes [provider]  promethazine (PHENERGAN) 25 MG tablet Take 25 mg by mouth every 8 (eight) hours as needed for nausea or vomiting.  01/30/17  Yes [provider]  Revefenacin 175 MCG/3ML SOLN Inhale 175 mcg into the lungs daily.   Yes [provider]  rosuvastatin (CRESTOR) 20 MG tablet Take 20 mg by mouth daily.  01/02/18  Yes [provider]  sodium chloride HYPERTONIC 3 % nebulizer solution Take 3 mLs by nebulization as needed for cough.   Yes [provider]  Vitamin D, Ergocalciferol, (DRISDOL) 50000 units CAPS capsule Take 50,000 Units by mouth every 30 (thirty) days.  12/09/17  Yes [provider]    Allergies Augmentin [amoxicillin-pot clavulanate]; Penicillins;  Codeine; and Biaxin [clarithromycin]  Family History  Problem Relation Age of Onset  . Coronary artery disease Mother   . Hypertension Mother   . Osteoporosis Mother   . Heart attack Mother   . Diabetes Father   . Hypertension Father   . CVA Father   . Stroke Father   . Coronary artery disease Sister   . Diabetes Sister   . Hearing loss Sister   . Hyperlipidemia Sister   . Lung disease Sister   . Stroke Sister   . Colon polyps Brother   . Diabetes Brother   . Hearing loss Brother   . Hypertension Brother   . Cancer Maternal Grandmother   . Heart disease Maternal Grandfather   . Diabetes Paternal Grandmother   . Diabetes Paternal Grandfather     Social History Social History   Tobacco Use  . Smoking status: Former Smoker    Packs/day: 3.00    Years: 38.00    Pack years: 114.00    Types: Cigarettes    Last attempt to quit: 04/30/2013    Years since quitting: 5.2  . Smokeless tobacco: Never Used  Substance Use Topics  . Alcohol use: Not Currently  . Drug use: No      Review of Systems Level 5 caveat secondary to altered mental status.  ____________________________________________   PHYSICAL EXAM:  VITAL SIGNS: ED Triage Vitals  Enc Vitals Group     BP 08/04/2018 1727 (!) 170/93     Pulse Rate 08/02/2018 1727 85     Resp 07/27/2018 1727 (!) 25     Temp --      Temp src --      SpO2 07/31/2018 1727 95 %     Weight 08/12/2018 1728 258 lb (117 kg)     Height --      Head Circumference --      Peak Flow --      Pain Score --      Pain Loc --      Pain Edu? --      Excl. in GC? --     Constitutional: Patient with GCS of 3. Eyes: Conjunctivae are normal.  Pupils are 3 mm, bilaterally.  No fixed gaze. Head: Atraumatic. Nose: No congestion/rhinnorhea. Mouth/Throat: Mucous membranes are moist.  Neck: No stridor.  Tracheostomy in place without inner cannula.  On trach collar and saturating 97%. Cardiovascular: Normal rate, regular rhythm. Grossly normal heart sounds.  Good peripheral circulation without palpable thrill left upper extremity surgical site.  No warmth or induration at the site. Respiratory: Normal respiratory effort.  No retractions. Lungs CTAB. Gastrointestinal: Soft. No distention.  Musculoskeletal: Minimal bilateral lower extremity edema. Neurologic:   No gross focal neurologic deficits are appreciated.  However, exam is confounded by patient's GCS of 3.  No posturing or rigidity. Skin:  Skin is warm, dry and intact. No rash noted.  ____________________________________________   LABS (all labs ordered are listed, but only abnormal results are displayed)  Labs Reviewed  BLOOD GAS, VENOUS - Abnormal; Notable for the following components:      Result Value   pH, Ven 7.22 (*)    Acid-base deficit 7.4 (*)    All other components within normal limits  CBC WITH DIFFERENTIAL/PLATELET - Abnormal; Notable for the following components:   WBC 18.4 (*)    RBC 3.71 (*)    Hemoglobin 9.7 (*)    HCT 32.1 (*)    RDW 15.8 (*)    Neutro  Abs 16.9 (*)    Abs Immature Granulocytes 0.15 (*)    All other components within normal limits  COMPREHENSIVE METABOLIC PANEL - Abnormal; Notable for the following components:   Potassium 3.3 (*)    CO2 20 (*)    Glucose, Bld 143 (*)    BUN 37 (*)    Creatinine, Ser 3.94 (*)    Calcium 8.0 (*)    Albumin 2.5 (*)    GFR calc non Af Amer 16 (*)    GFR calc Af Amer 18 (*)    All other components within normal limits  TROPONIN I - Abnormal; Notable for the following components:   Troponin I 0.03 (*)    All other components within normal limits  URINALYSIS, COMPLETE (UACMP) WITH MICROSCOPIC - Abnormal; Notable for the following components:   Color, Urine YELLOW (*)    APPearance HAZY (*)    Glucose, UA >=500 (*)    Hgb urine dipstick MODERATE (*)    Ketones, ur 5 (*)    Protein, ur 100 (*)    RBC / HPF >50 (*)    Bacteria, UA FEW (*)    All other components within normal limits  CULTURE, BLOOD (ROUTINE X 2)  CULTURE, BLOOD (ROUTINE X 2)  URINE CULTURE  LIPASE, BLOOD  AMMONIA  LACTIC ACID, PLASMA  LACTIC ACID, PLASMA  BRAIN NATRIURETIC PEPTIDE  CBG MONITORING, ED  CBG MONITORING, ED  CBG MONITORING, ED  CBG MONITORING, ED  CBG MONITORING, ED   ____________________________________________  EKG  ED ECG REPORT I, Arelia Longest, the attending physician, personally viewed and interpreted this ECG.   Date: 07/25/2018  EKG Time: 1726  Rate: 85  Rhythm: normal sinus rhythm  Axis: Normal  Intervals:Prolonged QTC at 515  ST&T Change: No ST segment elevation or depression.  No abnormal T wave inversion.  ____________________________________________  RADIOLOGY  Cardiomegaly on the chest x-ray with diffuse interstitial densities concerning for pulmonary edema or possibly inflammation.  CT head and CT cervical spine without acute finding. ____________________________________________   PROCEDURES  Procedure(s) performed:   .Critical Care Performed by: Myrna Blazer, MD Authorized by: Myrna Blazer, MD   Critical care provider statement:    Critical care time (minutes):  35   Critical care time was exclusive of:  Separately billable procedures and treating other patients   Critical care was necessary to treat or prevent imminent or life-threatening deterioration of the following conditions:  CNS failure or compromise   Critical care was time spent personally by me on the following activities:  Development of treatment plan with patient or surrogate, discussions with consultants, evaluation of patient's response to treatment, examination of patient, obtaining history from patient or surrogate, ordering and performing treatments and interventions, ordering and review of laboratory studies, ordering and review of radiographic studies, pulse oximetry, re-evaluation of patient's condition and review of old charts    Critical Care performed:    ____________________________________________   INITIAL IMPRESSION / ASSESSMENT AND PLAN / ED COURSE  Pertinent labs & imaging results that were available during my care of the patient were reviewed by me and considered in my medical decision making (see chart for details).  Differential diagnosis includes, but is not limited to, alcohol, illicit or prescription medications, or other toxic ingestion; intracranial pathology such as stroke or intracerebral hemorrhage; fever or infectious causes including sepsis; hypoxemia and/or hypercarbia; uremia; trauma; endocrine related disorders such as diabetes, hypoglycemia, and thyroid-related diseases; hypertensive encephalopathy; etc. As part  of my medical decision making, I reviewed the following data within the electronic MEDICAL RECORD NUMBER Notes from from recent fistula surgery as well as outpatient visits.  ----------------------------------------- 6:43 PM on 07/25/2018 -----------------------------------------  Patient came in with a tracheostomy  without inner cannula.  However, the patient appears to be ventilating adequately.  Maintaining oxygen saturations at 96-97%.  Unfortunately, the patient is still altered with a GCS of 3.  Unclear source at this time.  Possibly pulmonary versus urine versus bacteremia from recent surgical procedure.  Patient placed on broad-spectrum antibiotics.  Will be admitted to the hospital.  Signed out to Dr. Elpidio Anis. ____________________________________________   FINAL CLINICAL IMPRESSION(S) / ED DIAGNOSES  Altered mental status.  Hypoglycemia.  Sepsis.  NEW MEDICATIONS STARTED DURING THIS VISIT:  New Prescriptions   No medications on file     Note:  This document was prepared using Dragon voice recognition software and may include unintentional dictation errors.     Myrna Blazer, MD 08/09/2018 1847  Patient with pink frothy sputum.  Started on nitro drip.  Also called ENT and Dr. Willeen Cass was able to switch out the tracheostomy in place and 8, Shiley which is coughed and the patient is now on the ventilator.  He was having increasingly labored respirations and desaturations down to the low 90s of 90 to 93% on the trach collar.  I also discussed the case with the patient's wife who is aware of the need for admission to the hospital.  Patient at this time will open his eyes to painful stimuli and coughs when the trach is changed.    Myrna Blazer, MD 08/19/2018 2003

## 2018-08-01 NOTE — Consult Note (Signed)
Matthew Mayer [id number] July 12, 1961 Matthew Nearing, MD  Reason for Consult: Assist with tracheostomy tube replacement for ventilation Requesting Physician: Matthew Bow, MD Consulting Physician: Matthew Nearing, MD  HPI: Matthew Mayer is a 57 y.o. male with a history of end-stage renal disease as well as diabetes with a recently placed left upper cavity fistula was presented to the emergency department altered mental status.  EMS reports that he was last seen normal this morning.  EMS reports that he usually sleeps all day and this is why family did not check on him sooner.  However, he was unable to be awoken and was found to have an initial glucose of 42.  When fire rescue arrived he was also with an oxygen saturation in the 70s and off of his home oxygen per EMS.  EMS reports that he is supposed to be on oxygen ATC via his tracheostomy.  EMS placed him on oxygen as well as gave him D10 which brought his glucose up to the 200s.  However, despite the increase in the glucose level the patient's mental status did not improve.  Patient was also noted by EMS to have posturing just in his feet and was given a dose of Versed.  He was witnessed to be plantarflexed.  No known seizure history.  The family reported difficulties with replacing his tracheostomy tube, which is a #8 Shiley uncuffed trach without an inner cannula, therefore the ER consulted me to assist with replacement of his tracheostomy tube with a cuffed tube to allow ventilation support.   Medications:  Current Facility-Administered Medications  Medication Dose Route Frequency Provider Last Rate Last Dose  . acetaminophen (TYLENOL) tablet 650 mg  650 mg Oral Q6H PRN Matthew Bow, MD       Or  . acetaminophen (TYLENOL) suppository 650 mg  650 mg Rectal Q6H PRN Sudini, Srikar, MD      . albuterol (PROVENTIL) (2.5 MG/3ML) 0.083% nebulizer solution 2.5 mg  2.5 mg Nebulization Q2H PRN Sudini, Alveta Heimlich, MD      . bisacodyl (DULCOLAX)  suppository 10 mg  10 mg Rectal Daily PRN Sudini, Alveta Heimlich, MD      . dextrose 10 % infusion   Intravenous Continuous Sudini, Srikar, MD 30 mL/hr at 08/19/2018 1952    . heparin injection 5,000 Units  5,000 Units Subcutaneous Q8H Sudini, Srikar, MD      . hydrALAZINE (APRESOLINE) injection 10 mg  10 mg Intravenous Q6H PRN Sudini, Srikar, MD      . metroNIDAZOLE (FLAGYL) IVPB 500 mg  500 mg Intravenous Q8H Matthew Pyo, MD   Stopped at 08/15/2018 1914  . nitroGLYCERIN 50 mg in dextrose 5 % 250 mL (0.2 mg/mL) infusion  0-200 mcg/min Intravenous Titrated Matthew Pyo, MD 3 mL/hr at 08/08/2018 1954 10 mcg/min at 08/12/2018 1954  . ondansetron (ZOFRAN) tablet 4 mg  4 mg Oral Q6H PRN Matthew Bow, MD       Or  . ondansetron (ZOFRAN) injection 4 mg  4 mg Intravenous Q6H PRN Sudini, Srikar, MD      . sodium chloride flush (NS) 0.9 % injection 3 mL  3 mL Intravenous Q12H Sudini, Srikar, MD      . vancomycin (VANCOCIN) IVPB 1000 mg/200 mL premix  1,000 mg Intravenous Once Matthew Pyo, MD       Current Outpatient Medications  Medication Sig Dispense Refill  . albuterol (ACCUNEB) 1.25 MG/3ML nebulizer solution Inhale 1 ampule into the lungs every 6 (six) hours  as needed for wheezing or shortness of breath.     Marland Kitchen arformoterol (BROVANA) 15 MCG/2ML NEBU Take 15 mcg by nebulization 2 (two) times daily.     . calcitRIOL (ROCALTROL) 0.25 MCG capsule Take 0.25 mcg by mouth every evening.   12  . carvedilol (COREG) 25 MG tablet Take 25 mg by mouth 2 (two) times daily with a meal.    . clindamycin (CLEOCIN T) 1 % external solution Apply 1 application topically 2 (two) times daily as needed (rash).     . Continuous Blood Gluc Sensor (FREESTYLE LIBRE SENSOR SYSTEM) MISC USE 1 EACH EVERY 10 (TEN) DAYS E11.65  11  . cyclobenzaprine (FLEXERIL) 10 MG tablet Take 10 mg by mouth at bedtime.   0  . docusate sodium (COLACE) 100 MG capsule Take 100 mg by mouth at bedtime.    Marland Kitchen esomeprazole  (NEXIUM) 20 MG capsule Take 40 mg by mouth daily.     . fenofibrate (TRICOR) 145 MG tablet Take 145 mg by mouth every evening.   0  . furosemide (LASIX) 40 MG tablet Take 40 mg by mouth See admin instructions. Take 40 mg daily may take a second 40 mg dose as needed for swelling or weight gain 2lbs in 24 hours    . HUMULIN R 500 UNIT/ML injection Inject 25 Units into the skin 3 (three) times daily with meals.   5  . hydrocortisone valerate cream (WESTCORT) 0.2 % Apply 1 application topically 2 (two) times daily as needed (itching / rash).     . hydrOXYzine (ATARAX/VISTARIL) 25 MG tablet Take 25 mg by mouth at bedtime.     Marland Kitchen lisinopril (PRINIVIL,ZESTRIL) 20 MG tablet Take 20 mg by mouth at bedtime.     . Melatonin 10 MG TABS Take 20 mg by mouth at bedtime.    . Multiple Vitamin (MULTIVITAMIN) tablet Take 1 tablet by mouth daily.    . naproxen sodium (ALEVE) 220 MG tablet Take 220 mg by mouth 2 (two) times daily as needed (for pain).     Marland Kitchen oxyCODONE-acetaminophen (PERCOCET) 5-325 MG tablet Take 1-2 tablets by mouth every 6 (six) hours as needed for moderate pain or severe pain. 40 tablet 0  . PARoxetine (PAXIL) 30 MG tablet Take 30 mg by mouth at bedtime.     . promethazine (PHENERGAN) 25 MG tablet Take 25 mg by mouth every 8 (eight) hours as needed for nausea or vomiting.     . Revefenacin 175 MCG/3ML SOLN Inhale 175 mcg into the lungs daily.    . rosuvastatin (CRESTOR) 20 MG tablet Take 20 mg by mouth daily.   2  . sodium chloride HYPERTONIC 3 % nebulizer solution Take 3 mLs by nebulization as needed for cough.    . Vitamin D, Ergocalciferol, (DRISDOL) 50000 units CAPS capsule Take 50,000 Units by mouth every 30 (thirty) days.   6  .  (Not in a hospital admission)  Allergies:  Allergies  Allergen Reactions  . Augmentin [Amoxicillin-Pot Clavulanate] Anaphylaxis  . Penicillins Anaphylaxis    Has patient had a PCN reaction causing immediate rash, facial/tongue/throat swelling, SOB or  lightheadedness with hypotension: Yes Has patient had a PCN reaction causing severe rash involving mucus membranes or skin necrosis: No Has patient had a PCN reaction that required hospitalization: Yes Has patient had a PCN reaction occurring within the last 10 years: Yes If all of the above answers are "NO", then may proceed with Cephalosporin use.   . Codeine Itching  .  Biaxin [Clarithromycin] Rash    PMH:  Past Medical History:  Diagnosis Date  . Anemia   . Chronic kidney disease    stage 4  . COPD (chronic obstructive pulmonary disease) ([place])   . Depression   . Diabetes ([place])   . GERD (gastroesophageal reflux disease)   . Hyperlipidemia   . Hypertension   . MRSA (methicillin resistant Staphylococcus aureus)    pt states had in past  . Sleep apnea     Fam Hx:  Family History  Problem Relation Age of Onset  . Coronary artery disease Mother   . Hypertension Mother   . Osteoporosis Mother   . Heart attack Mother   . Diabetes Father   . Hypertension Father   . CVA Father   . Stroke Father   . Coronary artery disease Sister   . Diabetes Sister   . Hearing loss Sister   . Hyperlipidemia Sister   . Lung disease Sister   . Stroke Sister   . Colon polyps Brother   . Diabetes Brother   . Hearing loss Brother   . Hypertension Brother   . Cancer Maternal Grandmother   . Heart disease Maternal Grandfather   . Diabetes Paternal Grandmother   . Diabetes Paternal Grandfather     Soc Hx:  Social History   Socioeconomic History  . Marital status: Married    Spouse name: Not on file  . Number of children: Not on file  . Years of education: Not on file  . Highest education level: Not on file  Occupational History  . Not on file  Social Needs  . Financial resource strain: Not on file  . Food insecurity:    Worry: Not on file    Inability: Not on file  . Transportation needs:    Medical: Not on file    Non-medical: Not on file  Tobacco Use  . Smoking status:  Former Smoker    Packs/day: 3.00    Years: 38.00    Pack years: 114.00    Types: Cigarettes    Last attempt to quit: 04/30/2013    Years since quitting: 5.2  . Smokeless tobacco: Never Used  Substance and Sexual Activity  . Alcohol use: Not Currently  . Drug use: No  . Sexual activity: Not on file  Lifestyle  . Physical activity:    Days per week: Not on file    Minutes per session: Not on file  . Stress: Not on file  Relationships  . Social connections:    Talks on phone: Not on file    Gets together: Not on file    Attends religious service: Not on file    Active member of club or organization: Not on file    Attends meetings of clubs or organizations: Not on file    Relationship status: Not on file  . Intimate partner violence:    Fear of current or ex partner: Not on file    Emotionally abused: Not on file    Physically abused: Not on file    Forced sexual activity: Not on file  Other Topics Concern  . Not on file  Social History Narrative  . Not on file    PSH:  Past Surgical History:  Procedure Laterality Date  . AV FISTULA INSERTION W/ RF MAGNETIC GUIDANCE Left 07/30/2018   Procedure: AV FISTULA INSERTION W/RF MAGNETIC GUIDANCE;  Surgeon: Katha Cabal, MD;  Location: [facility];  Service: Cardiovascular;  Laterality: Left;  . CHOLECYSTECTOMY    . EXPLORATORY LAPAROTOMY    . Hand ganglion cyst Left   . laryngoscopy w/excision &/or aspiration  12/04/2013  . TRACHEOSTOMY    . Procedures since admission: No admission procedures for hospital encounter.  ROS: Review of systems normal other than 12 systems except per HPI.  PHYSICAL EXAM  Vitals: Blood pressure (!) 188/64, pulse 86, temperature 100.1 F (37.8 C), resp. rate (!) 24, weight 117 kg, SpO2 94 %.. General: Morbidly obese male, obtunded Mood: Patient is obtunded Orientation: Patient is obtunded, not oriented Vocal Quality: Unable to communicate head and Face: NCAT. No facial asymmetry. No  visible skin lesions. No significant facial scars. No tenderness with sinus percussion. Facial strength normal and symmetric. Ears: External ears with normal landmarks, no lesions. External auditory canals free of infection, cerumen impaction or lesions. Tympanic membranes intact with good landmarks and normal mobility on pneumatic otoscopy. No middle ear effusion. Hearing: Unable to assess Nose: External nose normal with midline dorsum and no lesions or deformity. Nasal Cavity reveals essentially midline septum with normal inferior turbinates. No significant mucosal congestion or erythema. Nasal secretions are minimal and clear. No polyps seen on anterior rhinoscopy. Oral Cavity/ Oropharynx: Lips are normal with no lesions. Teeth no frank dental caries. Gingiva healthy with no lesions or gingivitis. Oropharynx including tongue, buccal mucosa, floor of mouth, hard and soft palate, uvula and posterior pharynx free of exudates, erythema or lesions with normal symmetry and hydration.  Indirect Laryngoscopy/Nasopharyngoscopy: Visualization of the larynx, hypopharynx and nasopharynx is not possible in this setting with routine examination. Neck: Supple and symmetric with no palpable masses, tenderness or crepitance.  There is a tracheostomy tube in place which is a #8 Shiley tube with a well-healed tracheostomy wound.   Lymphatic: Cervical lymph nodes are without palpable lymphadenopathy or tenderness. Respiratory: Labored respirations. Cardiovascular: Carotid pulse shows regular rate and rhythm Neurologic: Unable to assess  eyes: Unable to assess  MEDICAL DECISION MAKING: Data Review:  Results for orders placed or performed during the hospital encounter of 08/15/2018 (from the past 48 hour(s))  Blood gas, venous     Status: Abnormal   Collection Time: 08/17/2018  5:29 PM  Result Value Ref Range   FIO2 0.35    Delivery systems  AEROSOL    pH, Ven 7.22 (L) 7.250 - 7.430   pCO2, Ven 49 44.0 - 60.0 mmHg    pO2, Ven 32.0 32.0 - 45.0 mmHg   Bicarbonate 20.1 20.0 - 28.0 mmol/L   Acid-base deficit 7.4 (H) 0.0 - 2.0 mmol/L   O2 Saturation 47.4 %   Patient temperature 37.0    Collection site VENOUS    Sample type VENOUS     Comment: Performed at [facility], [address] [place], [place] [address]  CBC with Differential     Status: Abnormal   Collection Time: 08/05/2018  5:30 PM  Result Value Ref Range   WBC 18.4 (H) 4.0 - 10.5 K/uL   RBC 3.71 (L) 4.22 - 5.81 MIL/uL   Hemoglobin 9.7 (L) 13.0 - 17.0 g/dL   HCT 32.1 (L) 39.0 - 52.0 %   MCV 86.5 80.0 - 100.0 fL   MCH 26.1 26.0 - 34.0 pg   MCHC 30.2 30.0 - 36.0 g/dL   RDW 15.8 (H) 11.5 - 15.5 %   Platelets 291 150 - 400 K/uL   nRBC 0.0 0.0 - 0.2 %   Neutrophils Relative % 92 %   Neutro Abs  16.9 (H) 1.7 - 7.7 K/uL   Lymphocytes Relative 5 %   Lymphs Abs 0.8 0.7 - 4.0 K/uL   Monocytes Relative 2 %   Monocytes Absolute 0.4 0.1 - 1.0 K/uL   Eosinophils Relative 0 %   Eosinophils Absolute 0.0 0.0 - 0.5 K/uL   Basophils Relative 0 %   Basophils Absolute 0.0 0.0 - 0.1 K/uL   Immature Granulocytes 1 %   Abs Immature Granulocytes 0.15 (H) 0.00 - 0.07 K/uL    Comment: Performed at [facility], [address] [place], [place] [address]  Comprehensive metabolic panel     Status: Abnormal   Collection Time: 08/03/2018  5:30 PM  Result Value Ref Range   Sodium 139 135 - 145 mmol/L   Potassium 3.3 (L) 3.5 - 5.1 mmol/L   Chloride 108 98 - 111 mmol/L   CO2 20 (L) 22 - 32 mmol/L   Glucose, Bld 143 (H) 70 - 99 mg/dL   BUN 37 (H) 6 - 20 mg/dL   Creatinine, Ser 3.94 (H) 0.61 - 1.24 mg/dL   Calcium 8.0 (L) 8.9 - 10.3 mg/dL   Total Protein 7.7 6.5 - 8.1 g/dL   Albumin 2.5 (L) 3.5 - 5.0 g/dL   AST 30 15 - 41 U/L   ALT 17 0 - 44 U/L   Alkaline Phosphatase 124 38 - 126 U/L   Total Bilirubin 0.7 0.3 - 1.2 mg/dL   GFR calc non Af Amer 16 (L) >60 mL/min   GFR calc Af Amer 18 (L) >60 mL/min    Comment: (NOTE) The eGFR has been  calculated using the CKD EPI equation. This calculation has not been validated in all clinical situations. eGFR's persistently <60 mL/min signify possible Chronic Kidney Disease.    Anion gap 11 5 - 15    Comment: Performed at [facility], [address] [place], [place] [address]  Troponin I STAT - ONCE     Status: Abnormal   Collection Time: 07/29/2018  5:30 PM  Result Value Ref Range   Troponin I 0.03 (HH) <0.03 ng/mL    Comment: CRITICAL RESULT CALLED TO, READ BACK BY AND VERIFIED WITH JESSICA REED AT [facility] 07/31/2018.  TFK Performed at [facility], [address] [place], [place] [address]   Lipase, blood     Status: None   Collection Time: 08/21/2018  5:30 PM  Result Value Ref Range   Lipase 35 11 - 51 U/L    Comment: Performed at [facility], [address] [place], [place] [address]  Ammonia     Status: None   Collection Time: 08/21/2018  5:30 PM  Result Value Ref Range   Ammonia 19 9 - 35 umol/L    Comment: Performed at [facility], [address] [place], [place] [address]  Lactic acid, plasma     Status: None   Collection Time: 08/17/2018  5:30 PM  Result Value Ref Range   Lactic Acid, Venous 0.9 0.5 - 1.9 mmol/L    Comment: Performed at [facility], [address] [place], [place] [address]  Brain natriuretic peptide     Status: Abnormal   Collection Time: 08/15/2018  5:30 PM  Result Value Ref Range   B Natriuretic Peptide 448.0 (H) 0.0 - 100.0 pg/mL    Comment: Performed at [facility], [address] [place], [place] [address]  Urinalysis, Complete w Microscopic     Status: Abnormal   Collection Time: 08/21/2018  5:31 PM  Result Value Ref Range   Color, Urine YELLOW (A) YELLOW   APPearance HAZY (A) CLEAR   Specific Gravity, Urine 1.010 1.005 - 1.030   pH 5.0 5.0 - 8.0   Glucose, UA >=500 (A) NEGATIVE mg/dL   Hgb urine dipstick MODERATE (A) NEGATIVE   Bilirubin Urine NEGATIVE NEGATIVE   Ketones, ur 5  (A) NEGATIVE mg/dL   Protein, ur 100 (A) NEGATIVE mg/dL   Nitrite NEGATIVE NEGATIVE   Leukocytes, UA NEGATIVE NEGATIVE   RBC / HPF >50 (H) 0 - 5 RBC/hpf   WBC, UA 11-20 0 - 5 WBC/hpf   Bacteria, UA FEW (A) NONE SEEN   Squamous Epithelial / LPF 0-5 0 - 5   Mucus PRESENT     Comment: Performed at [facility], [address] [place], [place] [address]  Lactic acid, plasma     Status: None   Collection Time: 08/08/2018  7:11 PM  Result Value Ref Range   Lactic Acid, Venous 0.7 0.5 - 1.9 mmol/L    Comment: Performed at [facility], [address] [place], [place] [address]  Glucose, capillary     Status: Abnormal   Collection Time: 07/29/2018  7:14 PM  Result Value Ref Range   Glucose-Capillary 110 (H) 70 - 99 mg/dL  . Dg Chest 1 View  Result Date: 08/09/2018 CLINICAL DATA:  Altered mental status. EXAM: CHEST  1 VIEW COMPARISON:  Radiographs of July 30, 2013. FINDINGS: Mild cardiomegaly is noted with increased diffuse interstitial densities throughout both lungs concerning for pulmonary edema or possibly inflammation. Tracheostomy tube is in grossly good position. No pneumothorax or pleural effusion is noted. Bony thorax is unremarkable. IMPRESSION: Cardiomegaly is noted with diffuse interstitial densities throughout both lungs concerning for pulmonary edema or possibly inflammation. Electronically Signed   By: Marijo Conception, M.D.   On: 08/09/2018 18:07   Ct Head Wo Contrast  Result Date: 07/27/2018 CLINICAL DATA:  57 y/o M; found at home unconscious and unresponsive. Seizure-like activity. EXAM: CT HEAD WITHOUT CONTRAST CT CERVICAL SPINE WITHOUT CONTRAST TECHNIQUE: Multidetector CT imaging of the head and cervical spine was performed following the standard protocol without intravenous contrast. Multiplanar CT image reconstructions of the cervical spine were also generated. COMPARISON:  05/17/2013 CT head FINDINGS: CT HEAD FINDINGS Brain: No evidence of acute infarction,  hemorrhage, hydrocephalus, extra-axial collection or mass lesion/mass effect. 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 Vascular: Calcific atherosclerosis of carotid siphons. No hyperdense vessel identified. Skull: Normal. Negative for fracture or focal lesion. Sinuses/Orbits: Mild paranasal sinus mucosal thickening. Normal aeration of mastoid air cells. Other: None. CT CERVICAL SPINE FINDINGS Alignment: Normal. Skull base and vertebrae: No acute fracture. No primary bone lesion or focal pathologic process. Soft tissues and spinal canal: No prevertebral fluid or swelling. No visible canal hematoma. Disc levels: Mild cervical spondylosis with discogenic degenerative changes greatest at the C6-7 level. No significant bony foraminal or canal stenosis. Upper chest: Negative. Other: Tracheostomy tube in situ. Calcific atherosclerosis of carotid bifurcations. IMPRESSION: CT head: 1. No acute intracranial abnormality identified. 2. Mild paranasal sinus disease. CT cervical spine: 1. No acute fracture or dislocation identified. 2. Mild cervical spondylosis greatest at C6-7 level. 3. Calcific atherosclerosis of carotid bifurcations. Electronically Signed   By: Kristine Garbe M.D.   On: 08/22/2018 18:12   Ct Cervical Spine Wo Contrast  Result Date: 07/28/2018 CLINICAL DATA:  57 y/o M; found at home unconscious and unresponsive. Seizure-like activity. EXAM:  CT HEAD WITHOUT CONTRAST CT CERVICAL SPINE WITHOUT CONTRAST TECHNIQUE: Multidetector CT imaging of the head and cervical spine was performed following the standard protocol without intravenous contrast. Multiplanar CT image reconstructions of the cervical spine were also generated. COMPARISON:  05/17/2013 CT head FINDINGS: CT HEAD FINDINGS Brain: No evidence of acute infarction, hemorrhage, hydrocephalus, extra-axial collection or mass lesion/mass effect. 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 Vascular:  Calcific atherosclerosis of carotid siphons. No hyperdense vessel identified. Skull: Normal. Negative for fracture or focal lesion. Sinuses/Orbits: Mild paranasal sinus mucosal thickening. Normal aeration of mastoid air cells. Other: None. CT CERVICAL SPINE FINDINGS Alignment: Normal. Skull base and vertebrae: No acute fracture. No primary bone lesion or focal pathologic process. Soft tissues and spinal canal: No prevertebral fluid or swelling. No visible canal hematoma. Disc levels: Mild cervical spondylosis with discogenic degenerative changes greatest at the C6-7 level. No significant bony foraminal or canal stenosis. Upper chest: Negative. Other: Tracheostomy tube in situ. Calcific atherosclerosis of carotid bifurcations. IMPRESSION: CT head: 1. No acute intracranial abnormality identified. 2. Mild paranasal sinus disease. CT cervical spine: 1. No acute fracture or dislocation identified. 2. Mild cervical spondylosis greatest at C6-7 level. 3. Calcific atherosclerosis of carotid bifurcations. Electronically Signed   By: Kristine Garbe M.D.   On: 08/17/2018 18:12  .   Procedure: Preoperative diagnosis: Respiratory distress Postoperative diagnosis: Same Procedure: Replacement of tracheostomy tube with tracheoscopy Description of procedure: The patient's endotracheal tube was assessed and found to be a #8 Shiley uncuffed tube.  There was no gross granulation tissue of the surrounding tracheostomy stoma.  A #8 cuffed tracheostomy tube was prepared diarrhea and lubricated for placement.  The existing tube was removed without difficulty, and the #8 cuffed Shiley tube was placed and appeared to catch initially on entering the airway.  A flexible fiberoptic scope was passed through the tube to visualize the trachea, and by maneuvering the tube a bit more anteriorly I was able to easily pass the tube at this point.  Proper placement into the tracheal airway was confirmed visually.  The scope was removed  and the cuff inflated and the airway suctioned.  Care of the patient was then returned to the emergency room physician.  ASSESSMENT: Patient with respiratory distress due to possible sepsis  PLAN: His airway is secured through his previously existing tracheostomy.  I was able to pass a #8 cuffed Shiley tube to allow ventilatory support without too much difficulty using assistance with the fiberoptic scope.  Will defer all work-up and management of his underlying conditions to the primary service.

## 2018-08-01 NOTE — Progress Notes (Signed)
CODE SEPSIS - PHARMACY COMMUNICATION  **Broad Spectrum Antibiotics should be administered within 1 hour of Sepsis diagnosis**  Time Code Sepsis Called/Page Received: 1801  Antibiotics Ordered: Flagyl, vancomycin and aztreonam  Time of 1st antibiotic administration: 1835  Additional action taken by pharmacy: none required  If necessary, Name of Provider/Nurse Contacted: N/A    Lowella Bandy ,PharmD Clinical Pharmacist  August 26, 2018  6:54 PM

## 2018-08-01 NOTE — Progress Notes (Signed)
eLink Physician-Brief Progress Note Patient Name: JERMARI TAMARGO DOB: [birth year] MRN: [id number]   Date of Service  08/04/2018  HPI/Events of Note  COPD, vocal chord paralysis, chronic trach, CKD s/p AVF 2 days ago found unresponsive, hypoglycemia, hypoxic, unknown down time.  Febrile on presentation with leukocytosis, pulm edema vs infiltrates on CXR.  Hypertensive SBP >200.  eICU Interventions  Broad spectrum antibiotics Diurese, may need dialysis BP control Ventilated post trach change     Intervention Category Major Interventions: Airway management;Sepsis - evaluation and management;Respiratory failure - evaluation and management Intermediate Interventions: Hypertension - evaluation and management Evaluation Type: New Patient Evaluation  Darl Pikes 07/25/2018, 10:32 PM

## 2018-08-01 NOTE — H&P (Signed)
[employer] [facility] [place] [facility]   PATIENT NAME: Matthew Mayer    MR#:  [id number]  DATE OF BIRTH:  [birth year]  DATE OF ADMISSION:  Aug 23, 2018  PRIMARY CARE PHYSICIAN: Marina Goodell, MD   REQUESTING/REFERRING PHYSICIAN: Dr. Pershing Proud  CHIEF COMPLAINT:   Chief Complaint  Patient presents with  . Altered Mental Status  . Seizures    HISTORY OF PRESENT ILLNESS:  Matthew Mayer  is a 57 y.o. male with a known history of CKD stage IV, hypertension, diabetes mellitus, COPD with tracheostomy who had AV fistula surgery 2 days back presents to the hospital brought in by EMS after he was found unresponsive by family.  He was found to have blood glucose of 42 along with oxygen saturations in the 70s.  Unclear of how long patient was down for.  He was given dextrose and put on oxygen and brought to the emergency room.  Patient continues to be unresponsive.  He had a chest x-ray and CT scan of the head.  Chest x-ray shows pulmonary edema and bilateral infiltrates concerning for pneumonia.  CT scan of the head shows nothing acute.  Patient has blood pressure up to 200.  On 10 L nasal cannula being extremely tachypneic. Fever of 102, leukocytosis.  Patient is being admitted for sepsis, acute on chronic hypoxic respiratory failure and encephalopathy.  Patient is critically ill.  History obtained by reviewing old records, ED staff and discussing with wife at bedside.  PAST MEDICAL HISTORY:   Past Medical History:  Diagnosis Date  . Anemia   . Chronic kidney disease    stage 4  . COPD (chronic obstructive pulmonary disease) (HCC)   . Depression   . Diabetes (HCC)   . GERD (gastroesophageal reflux disease)   . Hyperlipidemia   . Hypertension   . MRSA (methicillin resistant Staphylococcus aureus)    pt states had in past  . Sleep apnea     PAST SURGICAL HISTORY:   Past Surgical History:  Procedure Laterality Date  . AV FISTULA INSERTION W/ RF MAGNETIC GUIDANCE  Left 07/30/2018   Procedure: AV FISTULA INSERTION W/RF MAGNETIC GUIDANCE;  Surgeon: Renford Dills, MD;  Location: [facility] INVASIVE CV LAB;  Service: Cardiovascular;  Laterality: Left;  . CHOLECYSTECTOMY    . EXPLORATORY LAPAROTOMY    . Hand ganglion cyst Left   . laryngoscopy w/excision &/or aspiration  12/04/2013  . TRACHEOSTOMY      SOCIAL HISTORY:   Social History   Tobacco Use  . Smoking status: Former Smoker    Packs/day: 3.00    Years: 38.00    Pack years: 114.00    Types: Cigarettes    Last attempt to quit: 04/30/2013    Years since quitting: 5.2  . Smokeless tobacco: Never Used  Substance Use Topics  . Alcohol use: Not Currently    FAMILY HISTORY:   Family History  Problem Relation Age of Onset  . Coronary artery disease Mother   . Hypertension Mother   . Osteoporosis Mother   . Heart attack Mother   . Diabetes Father   . Hypertension Father   . CVA Father   . Stroke Father   . Coronary artery disease Sister   . Diabetes Sister   . Hearing loss Sister   . Hyperlipidemia Sister   . Lung disease Sister   . Stroke Sister   . Colon polyps Brother   . Diabetes Brother   . Hearing loss Brother   .  Hypertension Brother   . Cancer Maternal Grandmother   . Heart disease Maternal Grandfather   . Diabetes Paternal Grandmother   . Diabetes Paternal Grandfather     DRUG ALLERGIES:   Allergies  Allergen Reactions  . Augmentin [Amoxicillin-Pot Clavulanate] Anaphylaxis  . Penicillins Anaphylaxis    Has patient had a PCN reaction causing immediate rash, facial/tongue/throat swelling, SOB or lightheadedness with hypotension: Yes Has patient had a PCN reaction causing severe rash involving mucus membranes or skin necrosis: No Has patient had a PCN reaction that required hospitalization: Yes Has patient had a PCN reaction occurring within the last 10 years: Yes If all of the above answers are "NO", then may proceed with Cephalosporin use.   . Codeine Itching  .  Biaxin [Clarithromycin] Rash    REVIEW OF SYSTEMS:   Review of Systems  Unable to perform ROS: Mental status change    MEDICATIONS AT HOME:   Prior to Admission medications   Medication Sig Start Date End Date Taking? Authorizing Provider  albuterol (ACCUNEB) 1.25 MG/3ML nebulizer solution Inhale 1 ampule into the lungs every 6 (six) hours as needed for wheezing or shortness of breath.    Yes [provider]  arformoterol (BROVANA) 15 MCG/2ML NEBU Take 15 mcg by nebulization 2 (two) times daily.    Yes [provider]  calcitRIOL (ROCALTROL) 0.25 MCG capsule Take 0.25 mcg by mouth every evening.  04/15/18  Yes [provider]  carvedilol (COREG) 25 MG tablet Take 25 mg by mouth 2 (two) times daily with a meal.   Yes [provider]  clindamycin (CLEOCIN T) 1 % external solution Apply 1 application topically 2 (two) times daily as needed (rash).    Yes [provider]  Continuous Blood Gluc Sensor (FREESTYLE LIBRE SENSOR SYSTEM) MISC USE 1 EACH EVERY 10 (TEN) DAYS E11.65 02/05/18  Yes [provider]  cyclobenzaprine (FLEXERIL) 10 MG tablet Take 10 mg by mouth at bedtime.  12/09/17  Yes [provider]  docusate sodium (COLACE) 100 MG capsule Take 100 mg by mouth at bedtime.   Yes [provider]  esomeprazole (NEXIUM) 20 MG capsule Take 40 mg by mouth daily.    Yes [provider]  fenofibrate (TRICOR) 145 MG tablet Take 145 mg by mouth every evening.  12/06/17  Yes [provider]  furosemide (LASIX) 40 MG tablet Take 40 mg by mouth See admin instructions. Take 40 mg daily may take a second 40 mg dose as needed for swelling or weight gain 2lbs in 24 hours   Yes [provider]  HUMULIN R 500 UNIT/ML injection Inject 25 Units into the skin 3 (three) times daily with meals.    Yes [provider]  hydrocortisone valerate cream (WESTCORT) 0.2 % Apply 1 application topically 2 (two) times  daily as needed (itching / rash).  10/15/17  Yes [provider]  hydrOXYzine (ATARAX/VISTARIL) 25 MG tablet Take 25 mg by mouth at bedtime.    Yes [provider]  lisinopril (PRINIVIL,ZESTRIL) 20 MG tablet Take 20 mg by mouth at bedtime.    Yes [provider]  Melatonin 10 MG TABS Take 20 mg by mouth at bedtime.   Yes [provider]  Multiple Vitamin (MULTIVITAMIN) tablet Take 1 tablet by mouth daily.   Yes [provider]  naproxen sodium (ALEVE) 220 MG tablet Take 220 mg by mouth 2 (two) times daily as needed (for pain).    Yes [provider]  oxyCODONE-acetaminophen (PERCOCET) 5-325 MG tablet Take 1-2 tablets by mouth every 6 (six) hours as needed for moderate pain or severe pain. 07/30/18 07/30/19 Yes Schnier, Latina Craver, MD  PARoxetine (PAXIL) 30 MG tablet Take 30 mg by mouth at bedtime.    Yes [provider]  promethazine (PHENERGAN) 25 MG tablet Take 25 mg by mouth every 8 (eight) hours as needed for nausea or vomiting.  01/30/17  Yes [provider]  Revefenacin 175 MCG/3ML SOLN Inhale 175 mcg into the lungs daily.   Yes [provider]  rosuvastatin (CRESTOR) 20 MG tablet Take 20 mg by mouth daily.  01/02/18  Yes [provider]  sodium chloride HYPERTONIC 3 % nebulizer solution Take 3 mLs by nebulization as needed for cough.   Yes [provider]  Vitamin D, Ergocalciferol, (DRISDOL) 50000 units CAPS capsule Take 50,000 Units by mouth every 30 (thirty) days.  12/09/17  Yes [provider]     VITAL SIGNS:  Blood pressure (!) 205/88, pulse 83, temperature 100.2 F (37.9 C), resp. rate (!) 23, weight 117 kg, SpO2 96 %.  PHYSICAL EXAMINATION:  Physical Exam  GENERAL:  57 y.o.-year-old patient lying in the bed , obese.  Unresponsive EYES: Pupils equal, round, reactive to light and accommodation. No scleral icterus. Extraocular muscles intact.  HEENT: Head atraumatic, normocephalic.  Oropharynx and nasopharynx clear. No oropharyngeal erythema, moist oral mucosa  NECK:  Supple, no jugular venous distention. No thyroid enlargement Tracheostomy in place LUNGS:  bilateral coarse breath sounds.  Increased work of breathing using accessory muscles CARDIOVASCULAR: S1, S2 normal. No murmurs, rubs, or gallops.  ABDOMEN: Soft, nontender, nondistended. Bowel sounds present. No organomegaly or mass.  EXTREMITIES: No pedal edema, cyanosis NEUROLOGIC: Babinski is downgoing bilaterally.  LABORATORY PANEL:   CBC Recent Labs  Lab 08/15/2018 1730  WBC 18.4*  HGB 9.7*  HCT 32.1*  PLT 291   ------------------------------------------------------------------------------------------------------------------  Chemistries  Recent Labs  Lab 07/30/2018 1730  NA 139  K 3.3*  CL 108  CO2 20*  GLUCOSE 143*  BUN 37*  CREATININE 3.94*  CALCIUM 8.0*  AST 30  ALT 17  ALKPHOS 124  BILITOT 0.7   ------------------------------------------------------------------------------------------------------------------  Cardiac Enzymes Recent Labs  Lab 08/03/2018 1730  TROPONINI 0.03*   ------------------------------------------------------------------------------------------------------------------  RADIOLOGY:  Dg Chest 1 View  Result Date: 07/30/2018 CLINICAL DATA:  Altered mental status. EXAM: CHEST  1 VIEW COMPARISON:  Radiographs of July 30, 2013. FINDINGS: Mild cardiomegaly is noted with increased diffuse interstitial densities throughout both lungs concerning for pulmonary edema or possibly inflammation. Tracheostomy tube is in grossly good position. No pneumothorax or pleural effusion is noted. Bony thorax is unremarkable. IMPRESSION: Cardiomegaly is noted with diffuse interstitial densities throughout both lungs concerning for pulmonary edema or possibly inflammation. Electronically Signed   By: Lupita Raider, M.D.   On: 08/23/2018 18:07   Ct Head Wo Contrast  Result Date:  08/10/2018 CLINICAL DATA:  57 y/o M; found at home unconscious and unresponsive. Seizure-like activity. EXAM: CT HEAD WITHOUT CONTRAST CT CERVICAL SPINE WITHOUT CONTRAST TECHNIQUE: Multidetector CT imaging of the head and cervical spine was performed following the standard protocol without intravenous contrast. Multiplanar CT image reconstructions of the cervical spine were also generated. COMPARISON:  05/17/2013 CT head FINDINGS: CT HEAD FINDINGS Brain: No evidence of acute infarction, hemorrhage, hydrocephalus, extra-axial collection or mass lesion/mass effect. 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 Vascular: Calcific atherosclerosis of carotid siphons. No hyperdense vessel identified. Skull: Normal.  Negative for fracture or focal lesion. Sinuses/Orbits: Mild paranasal sinus mucosal thickening. Normal aeration of mastoid air cells. Other: None. CT CERVICAL SPINE FINDINGS Alignment: Normal. Skull base and vertebrae: No acute fracture. No primary bone lesion or focal pathologic process. Soft tissues and spinal canal: No prevertebral fluid or swelling. No visible canal hematoma. Disc levels: Mild cervical spondylosis with discogenic degenerative changes greatest at the C6-7 level. No significant bony foraminal or canal stenosis. Upper chest: Negative. Other: Tracheostomy tube in situ. Calcific atherosclerosis of carotid bifurcations. IMPRESSION: CT head: 1. No acute intracranial abnormality identified. 2. Mild paranasal sinus disease. CT cervical spine: 1. No acute fracture or dislocation identified. 2. Mild cervical spondylosis greatest at C6-7 level. 3. Calcific atherosclerosis of carotid bifurcations. Electronically Signed   By: Mitzi Hansen M.D.   On: 07/25/2018 18:12   Ct Cervical Spine Wo Contrast  Result Date: 07/30/2018 CLINICAL DATA:  57 y/o M; found at home unconscious and unresponsive. Seizure-like activity. EXAM: CT HEAD WITHOUT CONTRAST CT CERVICAL SPINE  WITHOUT CONTRAST TECHNIQUE: Multidetector CT imaging of the head and cervical spine was performed following the standard protocol without intravenous contrast. Multiplanar CT image reconstructions of the cervical spine were also generated. COMPARISON:  05/17/2013 CT head FINDINGS: CT HEAD FINDINGS Brain: No evidence of acute infarction, hemorrhage, hydrocephalus, extra-axial collection or mass lesion/mass effect. 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 Vascular: Calcific atherosclerosis of carotid siphons. No hyperdense vessel identified. Skull: Normal. Negative for fracture or focal lesion. Sinuses/Orbits: Mild paranasal sinus mucosal thickening. Normal aeration of mastoid air cells. Other: None. CT CERVICAL SPINE FINDINGS Alignment: Normal. Skull base and vertebrae: No acute fracture. No primary bone lesion or focal pathologic process. Soft tissues and spinal canal: No prevertebral fluid or swelling. No visible canal hematoma. Disc levels: Mild cervical spondylosis with discogenic degenerative changes greatest at the C6-7 level. No significant bony foraminal or canal stenosis. Upper chest: Negative. Other: Tracheostomy tube in situ. Calcific atherosclerosis of carotid bifurcations. IMPRESSION: CT head: 1. No acute intracranial abnormality identified. 2. Mild paranasal sinus disease. CT cervical spine: 1. No acute fracture or dislocation identified. 2. Mild cervical spondylosis greatest at C6-7 level. 3. Calcific atherosclerosis of carotid bifurcations. Electronically Signed   By: Mitzi Hansen M.D.   On: 08/04/2018 18:12     IMPRESSION AND PLAN:   *Severe sepsis with bilateral pneumonia and acute on chronic hypoxic respiratory failure with acute encephalopathy Patient is critically ill.  Will be admitted to ICU.  Discussed with ED physician Dr. Pershing Proud.  ENT will be called to switch the tracheostomy tube to start patient on full ventilatory support.  He is using  accessory muscles and is unresponsive to respiratory failure. No IV fluids due to pulmonary edema. Consult intensivist.  Discussed with [facility] attending. Broad-spectrum IV antibiotics  *Acute on chronic diastolic congestive heart failure.  Will give stat dose of IV Lasix.  Monitor renal function.  Monitor input and output.  Foley catheter placed.  *CKD stage IV.  Stable.  Consult nephrology if dialysis is needed.  *Hypoglycemia with diabetes mellitus.  Will start patient on every hour Accu-Cheks.  Start D10.  *Hypertension.  IV hydralazine.  Patient also started on nitro drip in the emergency room.  *DVT prophylaxis with heparin  All the records are reviewed and case discussed with ED provider. Management plans discussed with the patient, family and they are in agreement.  CODE STATUS: Full code  Patient at baseline has tracheostomy.  Ambulates on his own.  Has been following up with nephrology and oncology recently.  Wife is the healthcare power of attorney.  Discussed critical nature of illness and explained high risk for deterioration, cardiac arrest, death.  She understands.  All questions answered.  We discussed regarding CODE STATUS and patient is full code at this time with aggressive treatment.  TOTAL TIME TAKING CARE OF THIS PATIENT: 80 minutes.   Orie Fisherman M.D on 29-Aug-2018 at 7:35 PM  Between 7am to 6pm - Pager - [phone]  After 6pm go to [email or web address] - password [email or web address] [facility]  [employer] [facility]  Office  [phone]  CC: Primary care physician; Marina Goodell, MD  Note: This dictation was prepared with Dragon dictation along with smaller phrase technology. Any transcriptional errors that result from this process are unintentional.

## 2018-08-01 NOTE — ED Triage Notes (Addendum)
Pt to ED after being found at home unconcious and unresponsive per EMS. CBG was reported 45 upon arrival to EMS. After adminisitering dextrose CBG rose to the 200s. CBG 135 when checked in ED. EMS also reports seizure like activity that stopped after EMS administered versed. Pt has a trach in place and breathing independently. Pt is pale in color and not answering questions

## 2018-08-01 NOTE — Consult Note (Signed)
Pharmacy Antibiotic Note  Matthew Mayer is a 57 y.o. male admitted on 08/09/2018 with sepsis.  Pharmacy has been consulted for vancomycin and aztreonam dosing (penicillin anaphylactic allergy). 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 He recently had the AV fistula placed for HD by Dr Gilda Crease. There is no recent vancomycin dosing to guide therapy. His SCr is approximately 4 with a baseline of approximately 3 mg/dL. There is a possibility that nephrology will begin hemodialysis, in which case we will adjust therapy.  Plan: 1) Vancomycin 1500mg  IV every 24 hours beginning 6 hours after 1st 1000 mg dose  K 0.033  Vd 82L  T1/2 21h  Calculated concentrations at steady-state: 34.6/16.2 mcg/mL  Goal trough 15-20 mcg/mL.  Vt prior to the 4th dose  2) aztreonam 1 gram IV every 8 hours  Weight: 258 lb (117 kg)  Temp (24hrs), Avg:100.2 F (37.9 C), Min:100.2 F (37.9 C), Max:100.2 F (37.9 C)  Recent Labs  Lab 07/28/18 0941 08/22/2018 1730  WBC 10.7* 18.4*  CREATININE  --  3.94*    Estimated Creatinine Clearance: 24.9 mL/min (A) (by C-G formula based on SCr of 3.94 mg/dL (H)).    Allergies  Allergen Reactions  . Augmentin [Amoxicillin-Pot Clavulanate] Anaphylaxis  . Penicillins Anaphylaxis    Has patient had a PCN reaction causing immediate rash, facial/tongue/throat swelling, SOB or lightheadedness with hypotension: Yes Has patient had a PCN reaction causing severe rash involving mucus membranes or skin necrosis: No Has patient had a PCN reaction that required hospitalization: Yes Has patient had a PCN reaction occurring within the last 10 years: Yes If all of the above answers are "NO", then may proceed with Cephalosporin use.   . Codeine Itching  . Biaxin [Clarithromycin] Rash    Antimicrobials this admission: vancomycin 11/8 >>  Flagyl 11/8 >>  Aztreonam 11/8  Microbiology results: 11/8 BCx: pending 11/8 UCx: pending  11/9 MRSA PCR:   Thank you for allowing pharmacy to be  a part of this patient's care.  Lowella Bandy, PharmD 08/22/2018 7:00 PM

## 2018-08-01 NOTE — Progress Notes (Signed)
Patient seen at the beginning of shift.  He at that time was on a 35% aerosol trach collar with o2 saturations in low 90s.  Received verbal order from Dr Langston Masker to tracheal suction patient while waiting on ENT to arrive for trach change.  Able to pass 14 french catheter for moderate amount of thick pink tinged secretions..(Pre-post 100% o2 with suction). (1945). Patient tolerated interventions well.  Dr Willeen Cass arrived and changed trach from 8 shiley cuffless to 8 shiley cuffed to facilitate positive pressure ventilation.  Bilateral breath sounds verified by RT and MD.  Co2 detector confirmed positive color change. Placed patient on pressure support ventilation per Dr Langston Masker. Patient was not on any sedation.  Tolerated efforts well. Patient was transferred to icu without incident by RT and RN on vent. ABG obtained once settled. See results and vent flowsheet.

## 2018-08-01 NOTE — ED Notes (Signed)
Blood cultures obtained prior to antibiotic admin.  

## 2018-08-02 ENCOUNTER — Inpatient Hospital Stay: Payer: BLUE CROSS/BLUE SHIELD

## 2018-08-02 DIAGNOSIS — I161 Hypertensive emergency: Secondary | ICD-10-CM

## 2018-08-02 DIAGNOSIS — J9601 Acute respiratory failure with hypoxia: Secondary | ICD-10-CM

## 2018-08-02 LAB — COMPREHENSIVE METABOLIC PANEL
ALBUMIN: 2 g/dL — AB (ref 3.5–5.0)
ALK PHOS: 99 U/L (ref 38–126)
ALT: 16 U/L (ref 0–44)
AST: 22 U/L (ref 15–41)
Anion gap: 13 (ref 5–15)
BILIRUBIN TOTAL: 1 mg/dL (ref 0.3–1.2)
BUN: 39 mg/dL — AB (ref 6–20)
CO2: 23 mmol/L (ref 22–32)
CREATININE: 3.78 mg/dL — AB (ref 0.61–1.24)
Calcium: 7.8 mg/dL — ABNORMAL LOW (ref 8.9–10.3)
Chloride: 108 mmol/L (ref 98–111)
GFR calc Af Amer: 19 mL/min — ABNORMAL LOW (ref >60)
GFR, EST NON AFRICAN AMERICAN: 16 mL/min — AB (ref >60)
Glucose, Bld: 244 mg/dL — ABNORMAL HIGH (ref 70–99)
Potassium: 3.1 mmol/L — ABNORMAL LOW (ref 3.5–5.1)
Sodium: 144 mmol/L (ref 135–145)
TOTAL PROTEIN: 6.8 g/dL (ref 6.5–8.1)

## 2018-08-02 LAB — URINE CULTURE: CULTURE: NO GROWTH

## 2018-08-02 LAB — PROCALCITONIN: Procalcitonin: 0.11 ng/mL

## 2018-08-02 LAB — URINE DRUG SCREEN, QUALITATIVE (ARMC ONLY)
Amphetamines, Ur Screen: NOT DETECTED
BARBITURATES, UR SCREEN: NOT DETECTED
Benzodiazepine, Ur Scrn: POSITIVE — AB
CANNABINOID 50 NG, UR ~~LOC~~: NOT DETECTED
Cocaine Metabolite,Ur ~~LOC~~: NOT DETECTED
MDMA (ECSTASY) UR SCREEN: NOT DETECTED
Methadone Scn, Ur: NOT DETECTED
OPIATE, UR SCREEN: NOT DETECTED
Phencyclidine (PCP) Ur S: NOT DETECTED
Tricyclic, Ur Screen: NOT DETECTED

## 2018-08-02 LAB — HEMOGLOBIN A1C
Hgb A1c MFr Bld: 9.2 % — ABNORMAL HIGH (ref 4.8–5.6)
MEAN PLASMA GLUCOSE: 217.34 mg/dL

## 2018-08-02 LAB — BASIC METABOLIC PANEL
Anion gap: 11 (ref 5–15)
BUN: 48 mg/dL — ABNORMAL HIGH (ref 6–20)
CALCIUM: 8.1 mg/dL — AB (ref 8.9–10.3)
CHLORIDE: 106 mmol/L (ref 98–111)
CO2: 23 mmol/L (ref 22–32)
CREATININE: 3.63 mg/dL — AB (ref 0.61–1.24)
GFR calc Af Amer: 20 mL/min — ABNORMAL LOW (ref >60)
GFR calc non Af Amer: 17 mL/min — ABNORMAL LOW (ref >60)
GLUCOSE: 387 mg/dL — AB (ref 70–99)
Potassium: 3.3 mmol/L — ABNORMAL LOW (ref 3.5–5.1)
Sodium: 140 mmol/L (ref 135–145)

## 2018-08-02 LAB — GLUCOSE, CAPILLARY
GLUCOSE-CAPILLARY: 309 mg/dL — AB (ref 70–99)
GLUCOSE-CAPILLARY: 335 mg/dL — AB (ref 70–99)
GLUCOSE-CAPILLARY: 395 mg/dL — AB (ref 70–99)
GLUCOSE-CAPILLARY: 282 mg/dL — AB (ref 70–99)
Glucose-Capillary: 216 mg/dL — ABNORMAL HIGH (ref 70–99)
Glucose-Capillary: 286 mg/dL — ABNORMAL HIGH (ref 70–99)
Glucose-Capillary: 393 mg/dL — ABNORMAL HIGH (ref 70–99)
Glucose-Capillary: 313 mg/dL — ABNORMAL HIGH (ref 70–99)

## 2018-08-02 LAB — MAGNESIUM
MAGNESIUM: 2.1 mg/dL (ref 1.7–2.4)
Magnesium: 1.4 mg/dL — ABNORMAL LOW (ref 1.7–2.4)
Magnesium: 2.5 mg/dL — ABNORMAL HIGH (ref 1.7–2.4)

## 2018-08-02 LAB — CBC
HCT: 26.4 % — ABNORMAL LOW (ref 39.0–52.0)
Hemoglobin: 8.1 g/dL — ABNORMAL LOW (ref 13.0–17.0)
MCH: 26.5 pg (ref 26.0–34.0)
MCHC: 30.7 g/dL (ref 30.0–36.0)
MCV: 86.3 fL (ref 80.0–100.0)
Platelets: 233 10*3/uL (ref 150–400)
RBC: 3.06 MIL/uL — ABNORMAL LOW (ref 4.22–5.81)
RDW: 16 % — ABNORMAL HIGH (ref 11.5–15.5)
WBC: 12.1 10*3/uL — AB (ref 4.0–10.5)
nRBC: 0 % (ref 0.0–0.2)

## 2018-08-02 LAB — TROPONIN I: TROPONIN I: 0.06 ng/mL — AB (ref <0.03)

## 2018-08-02 LAB — PHOSPHORUS
Phosphorus: 5.9 mg/dL — ABNORMAL HIGH (ref 2.5–4.6)
Phosphorus: 5.5 mg/dL — ABNORMAL HIGH (ref 2.5–4.6)
Phosphorus: 5.5 mg/dL — ABNORMAL HIGH (ref 2.5–4.6)

## 2018-08-02 LAB — LACTIC ACID, PLASMA: LACTIC ACID, VENOUS: 1.3 mmol/L (ref 0.5–1.9)

## 2018-08-02 MED ORDER — FAMOTIDINE IN NACL 20-0.9 MG/50ML-% IV SOLN
20.0000 mg | Freq: Two times a day (BID) | INTRAVENOUS | Status: DC
  Administered 2018-08-02: 20 mg via INTRAVENOUS
  Filled 2018-08-02: qty 50

## 2018-08-02 MED ORDER — POTASSIUM CHLORIDE 20 MEQ PO PACK
60.0000 meq | PACK | Freq: Once | ORAL | Status: AC
  Administered 2018-08-02: 60 meq
  Filled 2018-08-02: qty 3

## 2018-08-02 MED ORDER — INSULIN ASPART 100 UNIT/ML ~~LOC~~ SOLN: 0.0000 [IU] | Freq: Every day | SUBCUTANEOUS | Status: DC

## 2018-08-02 MED ORDER — VANCOMYCIN HCL 10 G IV SOLR
1500.0000 mg | INTRAVENOUS | Status: DC
  Administered 2018-08-04: 1500 mg via INTRAVENOUS
  Filled 2018-08-02: qty 1500

## 2018-08-02 MED ORDER — VITAL HIGH PROTEIN PO LIQD
1000.0000 mL | ORAL | Status: DC
  Administered 2018-08-02: 1000 mL

## 2018-08-02 MED ORDER — POTASSIUM CHLORIDE 10 MEQ/100ML IV SOLN
10.0000 meq | INTRAVENOUS | Status: AC
  Administered 2018-08-02 (×4): 10 meq via INTRAVENOUS
  Filled 2018-08-02 (×4): qty 100

## 2018-08-02 MED ORDER — IPRATROPIUM-ALBUTEROL 0.5-2.5 (3) MG/3ML IN SOLN: 3.0000 mL | RESPIRATORY_TRACT | Status: DC

## 2018-08-02 MED ORDER — MAGNESIUM SULFATE 4 GM/100ML IV SOLN
4.0000 g | Freq: Once | INTRAVENOUS | Status: AC
  Administered 2018-08-02: 4 g via INTRAVENOUS
  Filled 2018-08-02: qty 100

## 2018-08-02 MED ORDER — INSULIN REGULAR(HUMAN) IN NACL 100-0.9 UT/100ML-% IV SOLN
INTRAVENOUS | Status: DC
  Administered 2018-08-02: 2.5 [IU]/h via INTRAVENOUS
  Administered 2018-08-03: 2.8 [IU]/h via INTRAVENOUS
  Filled 2018-08-02 (×3): qty 100

## 2018-08-02 MED ORDER — PRO-STAT SUGAR FREE PO LIQD
30.0000 mL | Freq: Two times a day (BID) | ORAL | Status: DC
  Administered 2018-08-02 – 2018-08-03 (×3): 30 mL

## 2018-08-02 MED ORDER — BUDESONIDE 0.25 MG/2ML IN SUSP
0.2500 mg | Freq: Two times a day (BID) | RESPIRATORY_TRACT | Status: DC
  Administered 2018-08-02 – 2018-08-04 (×5): 0.25 mg via RESPIRATORY_TRACT
  Filled 2018-08-02 (×5): qty 2

## 2018-08-02 MED ORDER — CHLORHEXIDINE GLUCONATE 0.12% ORAL RINSE (MEDLINE KIT)
15.0000 mL | Freq: Two times a day (BID) | OROMUCOSAL | Status: DC
  Administered 2018-08-02 – 2018-08-10 (×17): 15 mL via OROMUCOSAL

## 2018-08-02 MED ORDER — MIDAZOLAM HCL 2 MG/2ML IJ SOLN: 1.0000 mg | INTRAMUSCULAR | Status: DC | PRN

## 2018-08-02 MED ORDER — SODIUM CHLORIDE 0.9 % IV SOLN
INTRAVENOUS | Status: DC
  Administered 2018-08-02: 04:00:00 via INTRAVENOUS

## 2018-08-02 MED ORDER — ORAL CARE MOUTH RINSE
15.0000 mL | OROMUCOSAL | Status: DC
  Administered 2018-08-02 – 2018-08-10 (×83): 15 mL via OROMUCOSAL

## 2018-08-02 MED ORDER — INSULIN ASPART 100 UNIT/ML ~~LOC~~ SOLN
0.0000 [IU] | Freq: Three times a day (TID) | SUBCUTANEOUS | Status: DC
  Administered 2018-08-02: 11 [IU] via SUBCUTANEOUS
  Filled 2018-08-02: qty 1

## 2018-08-02 MED ORDER — FENTANYL CITRATE (PF) 100 MCG/2ML IJ SOLN: 50.0000 ug | INTRAMUSCULAR | Status: DC | PRN

## 2018-08-02 MED ORDER — CARVEDILOL 25 MG PO TABS
25.0000 mg | ORAL_TABLET | Freq: Two times a day (BID) | ORAL | Status: DC
  Administered 2018-08-02 – 2018-08-04 (×5): 25 mg via ORAL
  Filled 2018-08-02 (×5): qty 1

## 2018-08-02 MED ORDER — FUROSEMIDE 10 MG/ML IJ SOLN
40.0000 mg | Freq: Once | INTRAMUSCULAR | Status: AC
  Administered 2018-08-02: 40 mg via INTRAVENOUS
  Filled 2018-08-02: qty 4

## 2018-08-02 MED ORDER — IPRATROPIUM-ALBUTEROL 0.5-2.5 (3) MG/3ML IN SOLN
3.0000 mL | RESPIRATORY_TRACT | Status: DC
  Administered 2018-08-02 – 2018-08-04 (×13): 3 mL via RESPIRATORY_TRACT
  Filled 2018-08-02 (×13): qty 3

## 2018-08-02 MED ORDER — FAMOTIDINE IN NACL 20-0.9 MG/50ML-% IV SOLN
20.0000 mg | INTRAVENOUS | Status: DC
  Administered 2018-08-03: 20 mg via INTRAVENOUS
  Filled 2018-08-02: qty 50

## 2018-08-02 MED ORDER — LISINOPRIL 20 MG PO TABS
20.0000 mg | ORAL_TABLET | Freq: Every day | ORAL | Status: DC
  Administered 2018-08-02 – 2018-08-03 (×2): 20 mg via ORAL
  Filled 2018-08-02 (×2): qty 1

## 2018-08-02 MED ORDER — SODIUM CHLORIDE 0.9 % IV SOLN
1.0000 g | Freq: Three times a day (TID) | INTRAVENOUS | Status: DC
  Administered 2018-08-02 – 2018-08-04 (×7): 1 g via INTRAVENOUS
  Filled 2018-08-02 (×9): qty 1

## 2018-08-02 NOTE — Progress Notes (Signed)
[facility], [place] 08/02/18  Subjective:   Patient known to our practice from outpatient follow-up.  He was last seen in October.  On October 14, his creatinine was 3.13, GFR 21 Patient recently had an AV fistula placed under general anesthesia on November 6.  According to EMS notes, patient was found unresponsive on the evening of November 8.  He was last seen that morning. EMS noted patient was hypoglycemic in the 40s and hypoxic with oxygen saturation in the 70s.  Patient has chronic tracheostomy.  He was brought to the emergency room where tracheostomy was changed by ENT and patient was placed on ventilator Patient currently remains poorly responsive  Objective:  Vital signs in last 24 hours:  Temp:  [99.3 F (37.4 C)-100.3 F (37.9 C)] 99.5 F (37.5 C) (11/09 1200) Pulse Rate:  [73-104] 81 (11/09 1200) Resp:  [8-34] 20 (11/09 1200) BP: (119-205)/(41-165) 147/54 (11/09 1145) SpO2:  [93 %-100 %] 96 % (11/09 1206) FiO2 (%):  [40 %] 40 % (11/09 1206) Weight:  [117 kg-118.5 kg] 118.5 kg (11/08 2100)  Weight change:  Filed Weights   07/27/2018 1728 08/14/2018 2100  Weight: 117 kg 118.5 kg    Intake/Output:    Intake/Output Summary (Last 24 hours) at 08/02/2018 1228 Last data filed at 08/02/2018 1112 Gross per 24 hour  Intake 1395.39 ml  Output 3800 ml  Net -2404.61 ml     Physical Exam: General:  Critically ill-appearing, laying in the bed  HEENT  mouth open, dry tongue  Neck  tracheostomy in place  Pulm/lungs  ventilator assisted.  FiO2 40%, no crackles  CVS/Heart  regular, no rub  Abdomen:   Soft, obese  Extremities:  No peripheral edema  Neurologic:  Did not respond to verbal or tactile stimuli  Skin:  Warm, dry  Access:  Left upper extremity developing new  AV fistula, good bruit, thrill could not be felt       Basic Metabolic Panel:  Recent Labs  Lab 07/31/2018 1730 08/02/18 0356 08/02/18 0522 08/02/18 1145  NA 139 144  --   --    K 3.3* 3.1*  --   --   CL 108 108  --   --   CO2 20* 23  --   --   GLUCOSE 143* 244*  --   --   BUN 37* 39*  --   --   CREATININE 3.94* 3.78*  --   --   CALCIUM 8.0* 7.8*  --   --   MG  --   --  1.4* 2.5*  PHOS  --   --  5.9* 5.5*     CBC: Recent Labs  Lab 07/28/18 0941 08/16/2018 1730 08/02/18 0356  WBC 10.7* 18.4* 12.1*  NEUTROABS  --  16.9*  --   HGB 8.9* 9.7* 8.1*  HCT 29.0* 32.1* 26.4*  MCV 86.3 86.5 86.3  PLT 276 291 233     No results found for: HEPBSAG, HEPBSAB, HEPBIGM    Microbiology:  Recent Results (from the past 240 hour(s))  Blood culture (routine x 2)     Status: None (Preliminary result)   Collection Time: 08/16/2018  7:10 PM  Result Value Ref Range Status   Specimen Description BLOOD RIGHT ANTECUBITAL  Final   Special Requests   Final    BOTTLES DRAWN AEROBIC AND ANAEROBIC Blood Culture adequate volume   Culture   Final    NO GROWTH < 12 HOURS Performed at [facility], [place], [place] [address]    Report Status PENDING  Incomplete  Blood culture (routine x 2)     Status: None (Preliminary result)   Collection Time: 08/01/2018  7:10 PM  Result Value Ref Range Status   Specimen Description BLOOD BLOOD RIGHT FOREARM  Final   Special Requests   Final    BOTTLES DRAWN AEROBIC AND ANAEROBIC Blood Culture adequate volume   Culture   Final    NO GROWTH < 12 HOURS Performed at [facility], [address], [place], [place] [address]    Report Status PENDING  Incomplete  MRSA PCR Screening     Status: None   Collection Time: 08/05/2018  9:05 PM  Result Value Ref Range Status   MRSA by PCR NEGATIVE NEGATIVE Final    Comment:        The GeneXpert MRSA Assay (FDA approved for NASAL specimens only), is one component of a comprehensive MRSA colonization surveillance program. It is not intended to diagnose MRSA infection nor to guide or monitor treatment for MRSA infections. Performed at [facility], [address] [place], [place] [address]     Coagulation Studies: No results for input(s): LABPROT, INR in the last 72 hours.  Urinalysis: Recent Labs    08/16/2018 1731  COLORURINE YELLOW*  LABSPEC 1.010  PHURINE 5.0  GLUCOSEU >=500*  HGBUR MODERATE*  BILIRUBINUR NEGATIVE  KETONESUR 5*  PROTEINUR 100*  NITRITE NEGATIVE  LEUKOCYTESUR NEGATIVE      Imaging: Dg Chest 1 View  Result Date: 07/31/2018 CLINICAL DATA:  Altered mental status. EXAM: CHEST  1 VIEW COMPARISON:  Radiographs of July 30, 2013. FINDINGS: Mild cardiomegaly is noted with increased diffuse interstitial densities throughout both lungs concerning for pulmonary edema or possibly inflammation. Tracheostomy tube is in grossly good position. No pneumothorax or pleural effusion is noted. Bony thorax is unremarkable. IMPRESSION: Cardiomegaly is noted with diffuse interstitial densities throughout both lungs concerning for pulmonary edema or possibly inflammation. Electronically Signed   By: Marijo Conception, M.D.   On: 08/07/2018 18:07   Dg Abd 1 View  Result Date: 08/02/2018 CLINICAL DATA:  Nasogastric tube placement. EXAM: ABDOMEN - 1 VIEW COMPARISON:  CT of the abdomen and pelvis from 12/15/2010 FINDINGS: The patient's enteric tube is noted ending overlying the body of the stomach. The visualized bowel gas pattern is grossly unremarkable. The lung bases are difficult to fully characterize. No acute osseous abnormalities are seen. IMPRESSION: Enteric tube noted ending overlying the body of the stomach. Electronically Signed   By: Garald Balding M.D.   On: 08/02/2018 06:23   Ct Head Wo Contrast  Result Date: 07/27/2018 CLINICAL DATA:  57 y/o M; found at home unconscious and unresponsive. Seizure-like activity. EXAM: CT HEAD WITHOUT CONTRAST CT CERVICAL SPINE WITHOUT CONTRAST TECHNIQUE: Multidetector CT imaging of the head and cervical spine was performed following the standard protocol without intravenous contrast.  Multiplanar CT image reconstructions of the cervical spine were also generated. COMPARISON:  05/17/2013 CT head FINDINGS: CT HEAD FINDINGS Brain: No evidence of acute infarction, hemorrhage, hydrocephalus, extra-axial collection or mass lesion/mass effect. 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 Vascular: Calcific atherosclerosis of carotid siphons. No hyperdense vessel identified. Skull: Normal. Negative for fracture or focal lesion. Sinuses/Orbits: Mild paranasal sinus mucosal thickening. Normal aeration of mastoid air cells. Other: None. CT CERVICAL SPINE FINDINGS Alignment: Normal. Skull base and vertebrae: No acute fracture. No primary bone lesion or focal pathologic  process. Soft tissues and spinal canal: No prevertebral fluid or swelling. No visible canal hematoma. Disc levels: Mild cervical spondylosis with discogenic degenerative changes greatest at the C6-7 level. No significant bony foraminal or canal stenosis. Upper chest: Negative. Other: Tracheostomy tube in situ. Calcific atherosclerosis of carotid bifurcations. IMPRESSION: CT head: 1. No acute intracranial abnormality identified. 2. Mild paranasal sinus disease. CT cervical spine: 1. No acute fracture or dislocation identified. 2. Mild cervical spondylosis greatest at C6-7 level. 3. Calcific atherosclerosis of carotid bifurcations. Electronically Signed   By: Kristine Garbe M.D.   On: 07/26/2018 18:12   Ct Cervical Spine Wo Contrast  Result Date: 08/04/2018 CLINICAL DATA:  57 y/o M; found at home unconscious and unresponsive. Seizure-like activity. EXAM: CT HEAD WITHOUT CONTRAST CT CERVICAL SPINE WITHOUT CONTRAST TECHNIQUE: Multidetector CT imaging of the head and cervical spine was performed following the standard protocol without intravenous contrast. Multiplanar CT image reconstructions of the cervical spine were also generated. COMPARISON:  05/17/2013 CT head FINDINGS: CT HEAD FINDINGS Brain: No evidence  of acute infarction, hemorrhage, hydrocephalus, extra-axial collection or mass lesion/mass effect. 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 Vascular: Calcific atherosclerosis of carotid siphons. No hyperdense vessel identified. Skull: Normal. Negative for fracture or focal lesion. Sinuses/Orbits: Mild paranasal sinus mucosal thickening. Normal aeration of mastoid air cells. Other: None. CT CERVICAL SPINE FINDINGS Alignment: Normal. Skull base and vertebrae: No acute fracture. No primary bone lesion or focal pathologic process. Soft tissues and spinal canal: No prevertebral fluid or swelling. No visible canal hematoma. Disc levels: Mild cervical spondylosis with discogenic degenerative changes greatest at the C6-7 level. No significant bony foraminal or canal stenosis. Upper chest: Negative. Other: Tracheostomy tube in situ. Calcific atherosclerosis of carotid bifurcations. IMPRESSION: CT head: 1. No acute intracranial abnormality identified. 2. Mild paranasal sinus disease. CT cervical spine: 1. No acute fracture or dislocation identified. 2. Mild cervical spondylosis greatest at C6-7 level. 3. Calcific atherosclerosis of carotid bifurcations. Electronically Signed   By: Kristine Garbe M.D.   On: 07/25/2018 18:12   Dg Chest Port 1 View  Result Date: 08/02/2018 CLINICAL DATA:  Acute onset of respiratory failure. EXAM: PORTABLE CHEST 1 VIEW COMPARISON:  Chest radiograph performed 07/31/2018 FINDINGS: The patient's tracheostomy tube is seen ending 7 cm above the carina. The lungs are mildly hypoexpanded. Vascular congestion is seen. Patchy interstitial airspace opacities may reflect interstitial edema or pneumonia, mildly worsened from the prior study. There is no evidence of pleural effusion or pneumothorax. The cardiomediastinal silhouette is borderline normal in size. No acute osseous abnormalities are seen. IMPRESSION: Lungs mildly hypoexpanded. Vascular congestion noted.  Patchy interstitial airspace opacities may reflect interstitial edema or pneumonia, mildly worsened from the prior study. Electronically Signed   By: Garald Balding M.D.   On: 08/02/2018 06:24     Medications:   . aztreonam 1 g (08/02/18 3875)  . famotidine (PEPCID) IV 20 mg (08/02/18 1032)  . metronidazole 500 mg (08/02/18 1036)  . nitroGLYCERIN 15 mcg/min (08/02/18 0700)  . vancomycin Stopped (08/02/18 0536)   . budesonide (PULMICORT) nebulizer solution  0.25 mg Nebulization Q12H  . carvedilol  25 mg Oral BID WC  . chlorhexidine gluconate (MEDLINE KIT)  15 mL Mouth Rinse BID  . feeding supplement (PRO-STAT SUGAR FREE 64)  30 mL Per Tube BID  . feeding supplement (VITAL HIGH PROTEIN)  1,000 mL Per Tube Q24H  . heparin  5,000 Units Subcutaneous Q8H  . insulin aspart  0-15 Units Subcutaneous TID  WC  . insulin aspart  0-5 Units Subcutaneous QHS  . ipratropium-albuterol  3 mL Nebulization Q4H  . lisinopril  20 mg Oral Daily  . mouth rinse  15 mL Mouth Rinse 10 times per day  . sodium chloride flush  3 mL Intravenous Q12H   acetaminophen **OR** acetaminophen, bisacodyl, fentaNYL (SUBLIMAZE) injection, fentaNYL (SUBLIMAZE) injection, hydrALAZINE, midazolam, midazolam, ondansetron **OR** ondansetron (ZOFRAN) IV, sennosides  Assessment/ Plan:  57 y.o. 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  Acute on chronic kidney disease stage IV Baseline creatinine 3.13, GFR 21 from July 07, 2018 Creatinine has now increased to 3.78, Patient is nonoliguric with good urine output noted in the Foley bag Continue hemodynamic support and avoid hypotension and nephrotoxins No acute indication for hemodialysis at present Renal function is close to baseline.  Do not suspect that altered  mental status is due to uremia  2.  Altered mental status Differential includes hypoglycemia, hypoxia, seizure Urine drug screen is positive for benzodiazepines neurology evaluation is ongoing  3.  Diabetes type 2, insulin-dependent Poorly controlled Hemoglobin A1c 9.2% on 08/02/2018   LOS: [place] 11/9/201912:28 PM  [place], [place]  Note: This note was prepared with Dragon dictation. Any transcription errors are unintentional

## 2018-08-02 NOTE — Consult Note (Signed)
Pharmacy Antibiotic Note  Matthew Mayer is a 57 y.o. male admitted on 07/31/2018 with sepsis.  Pharmacy has been consulted for vancomycin and aztreonam dosing (penicillin anaphylactic allergy). 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 He recently had the AV fistula placed for HD by Dr Gilda Crease. There is no recent vancomycin dosing to guide therapy. His SCr is approximately 4 with a baseline of approximately 3 mg/dL. There is a possibility that nephrology will begin hemodialysis, in which case we will adjust therapy.  Plan: Due to concern over accumulation due to patient size and his current renal status- I am opting to change interval to q 48 hours.  Patient MRSA PCR is neg- consider d/c vancomycin In this case- would recommend changing aztreonam to levofloxacin for gram positive coverage.  2) aztreonam 1 gram IV every 8 hours  Height: 5\' 8"  (172.7 cm) Weight: 261 lb 3.9 oz (118.5 kg) IBW/kg (Calculated) : 68.4  Temp (24hrs), Avg:99.7 F (37.6 C), Min:99.3 F (37.4 C), Max:100.3 F (37.9 C)  Recent Labs  Lab 07/28/18 0941 08/08/2018 1730 08/09/2018 1911 08/02/18 0356 08/02/18 0522  WBC 10.7* 18.4*  --  12.1*  --   CREATININE  --  3.94*  --  3.78*  --   LATICACIDVEN  --  0.9 0.7  --  1.3    Estimated Creatinine Clearance: 27 mL/min (A) (by C-G formula based on SCr of 3.78 mg/dL (H)).    Allergies  Allergen Reactions  . Augmentin [Amoxicillin-Pot Clavulanate] Anaphylaxis  . Penicillins Anaphylaxis    Has patient had a PCN reaction causing immediate rash, facial/tongue/throat swelling, SOB or lightheadedness with hypotension: Yes Has patient had a PCN reaction causing severe rash involving mucus membranes or skin necrosis: No Has patient had a PCN reaction that required hospitalization: Yes Has patient had a PCN reaction occurring within the last 10 years: Yes If all of the above answers are "NO", then may proceed with Cephalosporin use.   . Codeine Itching  . Biaxin  [Clarithromycin] Rash    Antimicrobials this admission: vancomycin 11/8 >>  Flagyl 11/8 >>  Aztreonam 11/8  Microbiology results: 11/8 BCx: pending 11/8 UCx: pending  11/9 MRSA PCR:   Thank you for allowing pharmacy to be a part of this patient's care.  Olene Floss, PharmD 08/02/2018 2:16 PM

## 2018-08-02 NOTE — Progress Notes (Signed)
[facility] [place] [facility]   PATIENT NAME: Matthew Mayer    MR#:  [id number]  DATE OF BIRTH:  [birth year]  SUBJECTIVE:   Remains unresponsive. Wife in the room. REVIEW OF SYSTEMS:   Review of Systems  Unable to perform ROS: Intubated   DRUG ALLERGIES:   Allergies  Allergen Reactions  . Augmentin [Amoxicillin-Pot Clavulanate] Anaphylaxis  . Penicillins Anaphylaxis    Has patient had a PCN reaction causing immediate rash, facial/tongue/throat swelling, SOB or lightheadedness with hypotension: Yes Has patient had a PCN reaction causing severe rash involving mucus membranes or skin necrosis: No Has patient had a PCN reaction that required hospitalization: Yes Has patient had a PCN reaction occurring within the last 10 years: Yes If all of the above answers are "NO", then may proceed with Cephalosporin use.   . Codeine Itching  . Biaxin [Clarithromycin] Rash    VITALS:  Blood pressure (!) 157/51, pulse 76, temperature 99.5 F (37.5 C), resp. rate 19, height 5\' 8"  (1.727 m), weight 118.5 kg, SpO2 93 %.  PHYSICAL EXAMINATION:   Physical Exam  GENERAL:  57 y.o.-year-old patient lying in the bed with no acute distress. Critically and chronically ill EYES: Pupils equal, round, reactive to light and accommodation. No scleral icterus. Extraocular muscles intact.  HEENT: Head atraumatic, normocephalic. Oropharynx and nasopharynx clear. Tracheostomy++ NECK:  Supple, no jugular venous distention. No thyroid enlargementTrach ++ LUNGS: Normal breath sounds bilaterally, no wheezing, rales, rhonchi. No use of accessory muscles of respiration.  CARDIOVASCULAR: S1, S2 normal. No murmurs, rubs, or gallops.  ABDOMEN: Soft, nontender, nondistended. Bowel sounds present. No organomegaly or mass.  EXTREMITIES: No cyanosis, clubbing or edema b/l.    NEUROLOGIC: intubated PSYCHIATRIC:  Intubated SKIN: No obvious rash, lesion, or ulcer.   LABORATORY PANEL:   CBC Recent Labs  Lab 08/02/18 0356  WBC 12.1*  HGB 8.1*  HCT 26.4*  PLT 233    Chemistries  Recent Labs  Lab 08/02/18 0356  08/02/18 1145  NA 144  --   --   K 3.1*  --   --   CL 108  --   --   CO2 23  --   --   GLUCOSE 244*  --   --   BUN 39*  --   --   CREATININE 3.78*  --   --   CALCIUM 7.8*  --   --   MG  --    < > 2.5*  AST 22  --   --   ALT 16  --   --   ALKPHOS 99  --   --   BILITOT 1.0  --   --    < > = values in this interval not displayed.   Cardiac Enzymes Recent Labs  Lab 08/02/18 0522  TROPONINI 0.06*   RADIOLOGY:  Dg Chest 1 View  Result Date: 08/08/2018 CLINICAL DATA:  Altered mental status. EXAM: CHEST  1 VIEW COMPARISON:  Radiographs of July 30, 2013. FINDINGS: Mild cardiomegaly is noted with increased diffuse interstitial densities throughout both lungs concerning for pulmonary edema or possibly inflammation. Tracheostomy tube is in grossly good position. No pneumothorax or pleural effusion is noted. Bony thorax is unremarkable. IMPRESSION: Cardiomegaly is noted with diffuse interstitial densities throughout both lungs concerning for pulmonary edema or possibly inflammation. Electronically Signed   By: Lupita Raider, M.D.   On: 08/01/2018 18:07   Dg Abd 1 View  Result Date: 08/02/2018 CLINICAL DATA:  Nasogastric tube placement. EXAM: ABDOMEN - 1 VIEW COMPARISON:  CT of the abdomen and pelvis from 12/15/2010 FINDINGS: The patient's enteric tube is noted ending overlying the body of the stomach. The visualized bowel gas pattern is grossly unremarkable. The lung bases are difficult to fully characterize. No acute osseous abnormalities are seen. IMPRESSION: Enteric tube noted ending overlying the body of the stomach. Electronically Signed   By: Roanna Raider M.D.   On: 08/02/2018 06:23   Ct Head Wo Contrast  Result Date: 08/02/2018 CLINICAL DATA:  57 y/o M; found at home unconscious and unresponsive. Seizure-like activity. EXAM: CT HEAD WITHOUT  CONTRAST CT CERVICAL SPINE WITHOUT CONTRAST TECHNIQUE: Multidetector CT imaging of the head and cervical spine was performed following the standard protocol without intravenous contrast. Multiplanar CT image reconstructions of the cervical spine were also generated. COMPARISON:  05/17/2013 CT head FINDINGS: CT HEAD FINDINGS Brain: No evidence of acute infarction, hemorrhage, hydrocephalus, extra-axial collection or mass lesion/mass effect. 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 Vascular: Calcific atherosclerosis of carotid siphons. No hyperdense vessel identified. Skull: Normal. Negative for fracture or focal lesion. Sinuses/Orbits: Mild paranasal sinus mucosal thickening. Normal aeration of mastoid air cells. Other: None. CT CERVICAL SPINE FINDINGS Alignment: Normal. Skull base and vertebrae: No acute fracture. No primary bone lesion or focal pathologic process. Soft tissues and spinal canal: No prevertebral fluid or swelling. No visible canal hematoma. Disc levels: Mild cervical spondylosis with discogenic degenerative changes greatest at the C6-7 level. No significant bony foraminal or canal stenosis. Upper chest: Negative. Other: Tracheostomy tube in situ. Calcific atherosclerosis of carotid bifurcations. IMPRESSION: CT head: 1. No acute intracranial abnormality identified. 2. Mild paranasal sinus disease. CT cervical spine: 1. No acute fracture or dislocation identified. 2. Mild cervical spondylosis greatest at C6-7 level. 3. Calcific atherosclerosis of carotid bifurcations. Electronically Signed   By: Mitzi Hansen M.D.   On: 07/31/2018 18:12   Ct Cervical Spine Wo Contrast  Result Date: 08/10/2018 CLINICAL DATA:  57 y/o M; found at home unconscious and unresponsive. Seizure-like activity. EXAM: CT HEAD WITHOUT CONTRAST CT CERVICAL SPINE WITHOUT CONTRAST TECHNIQUE: Multidetector CT imaging of the head and cervical spine was performed following the standard protocol  without intravenous contrast. Multiplanar CT image reconstructions of the cervical spine were also generated. COMPARISON:  05/17/2013 CT head FINDINGS: CT HEAD FINDINGS Brain: No evidence of acute infarction, hemorrhage, hydrocephalus, extra-axial collection or mass lesion/mass effect. 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 Vascular: Calcific atherosclerosis of carotid siphons. No hyperdense vessel identified. Skull: Normal. Negative for fracture or focal lesion. Sinuses/Orbits: Mild paranasal sinus mucosal thickening. Normal aeration of mastoid air cells. Other: None. CT CERVICAL SPINE FINDINGS Alignment: Normal. Skull base and vertebrae: No acute fracture. No primary bone lesion or focal pathologic process. Soft tissues and spinal canal: No prevertebral fluid or swelling. No visible canal hematoma. Disc levels: Mild cervical spondylosis with discogenic degenerative changes greatest at the C6-7 level. No significant bony foraminal or canal stenosis. Upper chest: Negative. Other: Tracheostomy tube in situ. Calcific atherosclerosis of carotid bifurcations. IMPRESSION: CT head: 1. No acute intracranial abnormality identified. 2. Mild paranasal sinus disease. CT cervical spine: 1. No acute fracture or dislocation identified. 2. Mild cervical spondylosis greatest at C6-7 level. 3. Calcific atherosclerosis of carotid bifurcations. Electronically Signed   By: Mitzi Hansen M.D.   On: 08/03/2018 18:12   Dg Chest Port 1 View  Result Date: 08/02/2018 CLINICAL DATA:  Acute onset of respiratory failure. EXAM:  PORTABLE CHEST 1 VIEW COMPARISON:  Chest radiograph performed 26-Aug-2018 FINDINGS: The patient's tracheostomy tube is seen ending 7 cm above the carina. The lungs are mildly hypoexpanded. Vascular congestion is seen. Patchy interstitial airspace opacities may reflect interstitial edema or pneumonia, mildly worsened from the prior study. There is no evidence of pleural effusion  or pneumothorax. The cardiomediastinal silhouette is borderline normal in size. No acute osseous abnormalities are seen. IMPRESSION: Lungs mildly hypoexpanded. Vascular congestion noted. Patchy interstitial airspace opacities may reflect interstitial edema or pneumonia, mildly worsened from the prior study. Electronically Signed   By: Roanna Raider M.D.   On: 08/02/2018 06:24   ASSESSMENT AND PLAN:  Matthew Mayer  is a 57 y.o. male with a known history of CKD stage IV, hypertension, diabetes mellitus, COPD with tracheostomy who had AV fistula surgery 2 days back presents to the hospital brought in by EMS after he was found unresponsive by family.  He was found to have blood glucose of 42 along with oxygen saturations in the 70s.  Unclear of how long patient was down for.  He was given dextrose and put on oxygen and brought to the emergency room.   *Severe sepsis with bilateral pneumonia and acute on chronic hypoxic respiratory failure with acute encephalopathy Patient is critically ill.  -  Patient no on full ventilatory support via his tracheostomy since he was using accessory muscles and is unresponsive to respiratory failure. -No IV fluids due to pulmonary edema. --Broad-spectrum IV antibiotics  *Acute on chronic diastolic congestive heart failure. -  Will give stat dose of IV Lasix.  Monitor renal function.  Monitor input and output.  Foley catheter placed.  *CKD stage IV.  Stable.  Consult nephrology if dialysis is needed. -Patient had fistula placement in preparation for hemodialysis two days ago. -Received nephrology input  *Hypoglycemia with diabetes mellitus.  Will start patient on every hour Accu-Cheks.  Start D10.  *Hypertension.  IV hydralazine.  Patient also started on nitro drip in the emergency room.  *DVT prophylaxis with heparin  Discussed with wife in the room  Case discussed with Care Management/Social Worker. Management plans discussed with the patient, family and  they are in agreement.  CODE STATUS:full  DVT Prophylaxis: heparin  TOTAL TIME TAKING CARE OF THIS PATIENT: *30* minutes.  >50% time spent on counselling and coordination of care  POSSIBLE D/C IN few* DAYS, DEPENDING ON CLINICAL CONDITION.  Note: This dictation was prepared with Dragon dictation along with smaller phrase technology. Any transcriptional errors that result from this process are unintentional.  Enedina Finner M.D on 08/02/2018 at 2:56 PM  Between 7am to 6pm - Pager - [phone]  After 6pm go to [email or web address] - Social research officer, government  [facility]  Office  [phone]  CC: Primary care physician; Marina Goodell, MDPatient ID: Matthew Mayer, male   DOB: 1960-10-07, 57 y.o.   MRN: [id number]

## 2018-08-02 NOTE — Consult Note (Signed)
Reason for Consult:confusion   Referring Physician: Dr. Allena Katz   CC: confusion   HPI: Matthew Mayer is an 57 y.o. male with a known history of CKD stage IV, hypertension, diabetes mellitus, COPD with tracheostomy who had AV fistula presents to the hospital brought in by EMS after he was found unresponsive by family.  He was found to have blood glucose of 42 along with oxygen saturations in the 70s.  Unclear of how long patient was down for.  He was given dextrose and put on oxygen and brought to the emergency room.  Patient continues to be unresponsive.  He had a chest x-ray and CT scan of the head.  Chest x-ray shows pulmonary edema and bilateral infiltrates concerning for pneumonia.  CT scan of the head shows nothing acute.  Patient has blood pressure up to 200.  There was a concern for seizure activity and pt did receive benzodiazepine on route to [facility]  Past Medical History:  Diagnosis Date  . Anemia   . Chronic kidney disease    stage 4  . COPD (chronic obstructive pulmonary disease) (HCC)   . Depression   . Diabetes (HCC)   . GERD (gastroesophageal reflux disease)   . Hyperlipidemia   . Hypertension   . MRSA (methicillin resistant Staphylococcus aureus)    pt states had in past  . Sleep apnea     Past Surgical History:  Procedure Laterality Date  . AV FISTULA INSERTION W/ RF MAGNETIC GUIDANCE Left 07/30/2018   Procedure: AV FISTULA INSERTION W/RF MAGNETIC GUIDANCE;  Surgeon: Renford Dills, MD;  Location: [facility] INVASIVE CV LAB;  Service: Cardiovascular;  Laterality: Left;  . CHOLECYSTECTOMY    . EXPLORATORY LAPAROTOMY    . Hand ganglion cyst Left   . laryngoscopy w/excision &/or aspiration  12/04/2013  . TRACHEOSTOMY      Family History  Problem Relation Age of Onset  . Coronary artery disease Mother   . Hypertension Mother   . Osteoporosis Mother   . Heart attack Mother   . Diabetes Father   . Hypertension Father   . CVA Father   . Stroke Father   . Coronary  artery disease Sister   . Diabetes Sister   . Hearing loss Sister   . Hyperlipidemia Sister   . Lung disease Sister   . Stroke Sister   . Colon polyps Brother   . Diabetes Brother   . Hearing loss Brother   . Hypertension Brother   . Cancer Maternal Grandmother   . Heart disease Maternal Grandfather   . Diabetes Paternal Grandmother   . Diabetes Paternal Grandfather     Social History:  reports that he quit smoking about 5 years ago. His smoking use included cigarettes. He has a 114.00 pack-year smoking history. He has never used smokeless tobacco. He reports that he drank alcohol. He reports that he does not use drugs.  Allergies  Allergen Reactions  . Augmentin [Amoxicillin-Pot Clavulanate] Anaphylaxis  . Penicillins Anaphylaxis    Has patient had a PCN reaction causing immediate rash, facial/tongue/throat swelling, SOB or lightheadedness with hypotension: Yes Has patient had a PCN reaction causing severe rash involving mucus membranes or skin necrosis: No Has patient had a PCN reaction that required hospitalization: Yes Has patient had a PCN reaction occurring within the last 10 years: Yes If all of the above answers are "NO", then may proceed with Cephalosporin use.   . Codeine Itching  . Biaxin [Clarithromycin] Rash    Medications:  I have reviewed the patient's current medications.  ROS: Unable to obtain as not following commands   Physical Examination: Blood pressure (!) 147/54, pulse 81, temperature 99.5 F (37.5 C), resp. rate 20, height 5\' 8"  (1.727 m), weight 118.5 kg, SpO2 95 %.  Does not open eyes Withdrawal from painful stimuli Positive cough and gag  Laboratory Studies:   Basic Metabolic Panel: Recent Labs  Lab 08/08/2018 1730 08/02/18 0356 08/02/18 0522  NA 139 144  --   K 3.3* 3.1*  --   CL 108 108  --   CO2 20* 23  --   GLUCOSE 143* 244*  --   BUN 37* 39*  --   CREATININE 3.94* 3.78*  --   CALCIUM 8.0* 7.8*  --   MG  --   --  1.4*  PHOS  --    --  5.9*    Liver Function Tests: Recent Labs  Lab 08/03/2018 1730 08/02/18 0356  AST 30 22  ALT 17 16  ALKPHOS 124 99  BILITOT 0.7 1.0  PROT 7.7 6.8  ALBUMIN 2.5* 2.0*   Recent Labs  Lab 07/31/2018 1730  LIPASE 35   Recent Labs  Lab 08/05/2018 1730  AMMONIA 19    CBC: Recent Labs  Lab 07/28/18 0941 08/21/2018 1730 08/02/18 0356  WBC 10.7* 18.4* 12.1*  NEUTROABS  --  16.9*  --   HGB 8.9* 9.7* 8.1*  HCT 29.0* 32.1* 26.4*  MCV 86.3 86.5 86.3  PLT 276 291 233    Cardiac Enzymes: Recent Labs  Lab 08/19/2018 1730 08/02/18 0522  TROPONINI 0.03* 0.06*    BNP: Invalid input(s): POCBNP  CBG: Recent Labs  Lab 07/31/2018 2307 08/20/2018 2357 08/02/18 0342 08/02/18 0724 08/02/18 1132  GLUCAP 166* 167* 216* 286* 309*    Microbiology: Results for orders placed or performed during the hospital encounter of 08/05/2018  Blood culture (routine x 2)     Status: None (Preliminary result)   Collection Time: 08/14/2018  7:10 PM  Result Value Ref Range Status   Specimen Description BLOOD RIGHT ANTECUBITAL  Final   Special Requests   Final    BOTTLES DRAWN AEROBIC AND ANAEROBIC Blood Culture adequate volume   Culture   Final    NO GROWTH < 12 HOURS Performed at [facility], [address], [place], [place] [address]    Report Status PENDING  Incomplete  Blood culture (routine x 2)     Status: None (Preliminary result)   Collection Time: 08/20/2018  7:10 PM  Result Value Ref Range Status   Specimen Description BLOOD BLOOD RIGHT FOREARM  Final   Special Requests   Final    BOTTLES DRAWN AEROBIC AND ANAEROBIC Blood Culture adequate volume   Culture   Final    NO GROWTH < 12 HOURS Performed at [facility], [address], [place], [place] [address]    Report Status PENDING  Incomplete  MRSA PCR Screening     Status: None   Collection Time: 07/26/2018  9:05 PM  Result Value Ref Range Status   MRSA by PCR NEGATIVE NEGATIVE Final    Comment:        The  GeneXpert MRSA Assay (FDA approved for NASAL specimens only), is one component of a comprehensive MRSA colonization surveillance program. It is not intended to diagnose MRSA infection nor to guide or monitor treatment for MRSA infections. Performed at [facility], [address], [place], [place] [address]  Coagulation Studies: No results for input(s): LABPROT, INR in the last 72 hours.  Urinalysis:  Recent Labs  Lab 07/26/2018 1731  COLORURINE YELLOW*  LABSPEC 1.010  PHURINE 5.0  GLUCOSEU >=500*  HGBUR MODERATE*  BILIRUBINUR NEGATIVE  KETONESUR 5*  PROTEINUR 100*  NITRITE NEGATIVE  LEUKOCYTESUR NEGATIVE    Lipid Panel:     Component Value Date/Time   TRIG 171 05/25/2013 0410    HgbA1C:  Lab Results  Component Value Date   HGBA1C 9.2 (H) 08/02/2018    Urine Drug Screen:      Component Value Date/Time   LABOPIA NONE DETECTED 08/02/2018 1026   COCAINSCRNUR NONE DETECTED 08/02/2018 1026   LABBENZ POSITIVE (A) 08/02/2018 1026   AMPHETMU NONE DETECTED 08/02/2018 1026   THCU NONE DETECTED 08/02/2018 1026   LABBARB NONE DETECTED 08/02/2018 1026    Alcohol Level: No results for input(s): ETH in the last 168 hours.    Imaging: Dg Chest 1 View  Result Date: 08/12/2018 CLINICAL DATA:  Altered mental status. EXAM: CHEST  1 VIEW COMPARISON:  Radiographs of July 30, 2013. FINDINGS: Mild cardiomegaly is noted with increased diffuse interstitial densities throughout both lungs concerning for pulmonary edema or possibly inflammation. Tracheostomy tube is in grossly good position. No pneumothorax or pleural effusion is noted. Bony thorax is unremarkable. IMPRESSION: Cardiomegaly is noted with diffuse interstitial densities throughout both lungs concerning for pulmonary edema or possibly inflammation. Electronically Signed   By: Lupita Raider, M.D.   On: 08/04/2018 18:07   Dg Abd 1 View  Result Date: 08/02/2018 CLINICAL DATA:  Nasogastric tube placement.  EXAM: ABDOMEN - 1 VIEW COMPARISON:  CT of the abdomen and pelvis from 12/15/2010 FINDINGS: The patient's enteric tube is noted ending overlying the body of the stomach. The visualized bowel gas pattern is grossly unremarkable. The lung bases are difficult to fully characterize. No acute osseous abnormalities are seen. IMPRESSION: Enteric tube noted ending overlying the body of the stomach. Electronically Signed   By: Roanna Raider M.D.   On: 08/02/2018 06:23   Ct Head Wo Contrast  Result Date: 07/28/2018 CLINICAL DATA:  57 y/o M; found at home unconscious and unresponsive. Seizure-like activity. EXAM: CT HEAD WITHOUT CONTRAST CT CERVICAL SPINE WITHOUT CONTRAST TECHNIQUE: Multidetector CT imaging of the head and cervical spine was performed following the standard protocol without intravenous contrast. Multiplanar CT image reconstructions of the cervical spine were also generated. COMPARISON:  05/17/2013 CT head FINDINGS: CT HEAD FINDINGS Brain: No evidence of acute infarction, hemorrhage, hydrocephalus, extra-axial collection or mass lesion/mass effect. 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 Vascular: Calcific atherosclerosis of carotid siphons. No hyperdense vessel identified. Skull: Normal. Negative for fracture or focal lesion. Sinuses/Orbits: Mild paranasal sinus mucosal thickening. Normal aeration of mastoid air cells. Other: None. CT CERVICAL SPINE FINDINGS Alignment: Normal. Skull base and vertebrae: No acute fracture. No primary bone lesion or focal pathologic process. Soft tissues and spinal canal: No prevertebral fluid or swelling. No visible canal hematoma. Disc levels: Mild cervical spondylosis with discogenic degenerative changes greatest at the C6-7 level. No significant bony foraminal or canal stenosis. Upper chest: Negative. Other: Tracheostomy tube in situ. Calcific atherosclerosis of carotid bifurcations. IMPRESSION: CT head: 1. No acute intracranial abnormality  identified. 2. Mild paranasal sinus disease. CT cervical spine: 1. No acute fracture or dislocation identified. 2. Mild cervical spondylosis greatest at C6-7 level. 3. Calcific atherosclerosis of carotid bifurcations. Electronically Signed   By: Mitzi Hansen M.D.   On:  08/19/2018 18:12   Ct Cervical Spine Wo Contrast  Result Date: 08/12/2018 CLINICAL DATA:  57 y/o M; found at home unconscious and unresponsive. Seizure-like activity. EXAM: CT HEAD WITHOUT CONTRAST CT CERVICAL SPINE WITHOUT CONTRAST TECHNIQUE: Multidetector CT imaging of the head and cervical spine was performed following the standard protocol without intravenous contrast. Multiplanar CT image reconstructions of the cervical spine were also generated. COMPARISON:  05/17/2013 CT head FINDINGS: CT HEAD FINDINGS Brain: No evidence of acute infarction, hemorrhage, hydrocephalus, extra-axial collection or mass lesion/mass effect. 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 Vascular: Calcific atherosclerosis of carotid siphons. No hyperdense vessel identified. Skull: Normal. Negative for fracture or focal lesion. Sinuses/Orbits: Mild paranasal sinus mucosal thickening. Normal aeration of mastoid air cells. Other: None. CT CERVICAL SPINE FINDINGS Alignment: Normal. Skull base and vertebrae: No acute fracture. No primary bone lesion or focal pathologic process. Soft tissues and spinal canal: No prevertebral fluid or swelling. No visible canal hematoma. Disc levels: Mild cervical spondylosis with discogenic degenerative changes greatest at the C6-7 level. No significant bony foraminal or canal stenosis. Upper chest: Negative. Other: Tracheostomy tube in situ. Calcific atherosclerosis of carotid bifurcations. IMPRESSION: CT head: 1. No acute intracranial abnormality identified. 2. Mild paranasal sinus disease. CT cervical spine: 1. No acute fracture or dislocation identified. 2. Mild cervical spondylosis greatest at C6-7  level. 3. Calcific atherosclerosis of carotid bifurcations. Electronically Signed   By: Mitzi Hansen M.D.   On: 08/22/2018 18:12   Dg Chest Port 1 View  Result Date: 08/02/2018 CLINICAL DATA:  Acute onset of respiratory failure. EXAM: PORTABLE CHEST 1 VIEW COMPARISON:  Chest radiograph performed Aug 13, 2018 FINDINGS: The patient's tracheostomy tube is seen ending 7 cm above the carina. The lungs are mildly hypoexpanded. Vascular congestion is seen. Patchy interstitial airspace opacities may reflect interstitial edema or pneumonia, mildly worsened from the prior study. There is no evidence of pleural effusion or pneumothorax. The cardiomediastinal silhouette is borderline normal in size. No acute osseous abnormalities are seen. IMPRESSION: Lungs mildly hypoexpanded. Vascular congestion noted. Patchy interstitial airspace opacities may reflect interstitial edema or pneumonia, mildly worsened from the prior study. Electronically Signed   By: Roanna Raider M.D.   On: 08/02/2018 06:24     Assessment/Plan:   57 y.o. male with a known history of CKD stage IV, hypertension, diabetes mellitus, COPD with tracheostomy who had AV fistula presents to the hospital brought in by EMS after he was found unresponsive by family.  He was found to have blood glucose of 42 along with oxygen saturations in the 70s.  Unclear of how long patient was down for.  He was given dextrose and put on oxygen and brought to the emergency room.  Patient continues to be unresponsive.  He had a chest x-ray and CT scan of the head.  Chest x-ray shows pulmonary edema and bilateral infiltrates concerning for pneumonia.  CT scan of the head shows nothing acute.  Patient has blood pressure up to 200.  There was a concern for seizure activity and pt did receive benzodiazepine on route to [facility]  - Confusion/AMS is likely related to multiple metabolic problems which he is being treated for - Brief possible seizure likely in setting of  hypoglycemia on arrival which has been improved.  - CTh reviewed -Given found unresponsive and hypoxic there is possibility of anoxia - Treat metabolic problems but crit care team at this time, if does not improve would likely repeat [facility] or MRI brain early next week -  No anti epileptic medications at this time.   Pauletta Browns  08/02/2018, 12:01 PM

## 2018-08-03 LAB — GLUCOSE, CAPILLARY
GLUCOSE-CAPILLARY: 166 mg/dL — AB (ref 70–99)
GLUCOSE-CAPILLARY: 136 mg/dL — AB (ref 70–99)
GLUCOSE-CAPILLARY: 134 mg/dL — AB (ref 70–99)
GLUCOSE-CAPILLARY: 165 mg/dL — AB (ref 70–99)
GLUCOSE-CAPILLARY: 141 mg/dL — AB (ref 70–99)
GLUCOSE-CAPILLARY: 151 mg/dL — AB (ref 70–99)
Glucose-Capillary: 145 mg/dL — ABNORMAL HIGH (ref 70–99)
Glucose-Capillary: 148 mg/dL — ABNORMAL HIGH (ref 70–99)
Glucose-Capillary: 161 mg/dL — ABNORMAL HIGH (ref 70–99)
Glucose-Capillary: 172 mg/dL — ABNORMAL HIGH (ref 70–99)
Glucose-Capillary: 189 mg/dL — ABNORMAL HIGH (ref 70–99)
Glucose-Capillary: 244 mg/dL — ABNORMAL HIGH (ref 70–99)
Glucose-Capillary: 245 mg/dL — ABNORMAL HIGH (ref 70–99)

## 2018-08-03 LAB — CBC
HCT: 28.2 % — ABNORMAL LOW (ref 39.0–52.0)
Hemoglobin: 8.7 g/dL — ABNORMAL LOW (ref 13.0–17.0)
MCH: 26 pg (ref 26.0–34.0)
MCHC: 30.9 g/dL (ref 30.0–36.0)
MCV: 84.4 fL (ref 80.0–100.0)
PLATELETS: 286 10*3/uL (ref 150–400)
RBC: 3.34 MIL/uL — AB (ref 4.22–5.81)
RDW: 16.5 % — ABNORMAL HIGH (ref 11.5–15.5)
WBC: 11.4 10*3/uL — ABNORMAL HIGH (ref 4.0–10.5)
nRBC: 0 % (ref 0.0–0.2)

## 2018-08-03 LAB — MAGNESIUM: MAGNESIUM: 2.2 mg/dL (ref 1.7–2.4)

## 2018-08-03 LAB — PROCALCITONIN: Procalcitonin: 0.21 ng/mL

## 2018-08-03 LAB — PHOSPHORUS: PHOSPHORUS: 3.5 mg/dL (ref 2.5–4.6)

## 2018-08-03 MED ORDER — AMLODIPINE BESYLATE 10 MG PO TABS
10.0000 mg | ORAL_TABLET | Freq: Every day | ORAL | Status: DC
  Administered 2018-08-03 – 2018-08-10 (×8): 10 mg via NASOGASTRIC
  Filled 2018-08-03 (×8): qty 1

## 2018-08-03 MED ORDER — ADULT MULTIVITAMIN LIQUID CH
15.0000 mL | Freq: Every day | ORAL | Status: DC
  Administered 2018-08-04 – 2018-08-10 (×7): 15 mL
  Filled 2018-08-03 (×9): qty 15

## 2018-08-03 MED ORDER — INSULIN DETEMIR 100 UNIT/ML ~~LOC~~ SOLN
20.0000 [IU] | Freq: Two times a day (BID) | SUBCUTANEOUS | Status: DC
  Administered 2018-08-03 (×2): 20 [IU] via SUBCUTANEOUS
  Filled 2018-08-03 (×3): qty 0.2

## 2018-08-03 MED ORDER — FUROSEMIDE 10 MG/ML IJ SOLN
40.0000 mg | Freq: Once | INTRAMUSCULAR | Status: AC
  Administered 2018-08-03: 40 mg via INTRAVENOUS
  Filled 2018-08-03: qty 4

## 2018-08-03 MED ORDER — PRO-STAT SUGAR FREE PO LIQD
30.0000 mL | Freq: Three times a day (TID) | ORAL | Status: DC
  Administered 2018-08-03 – 2018-08-10 (×23): 30 mL

## 2018-08-03 MED ORDER — METOPROLOL TARTRATE 5 MG/5ML IV SOLN
5.0000 mg | Freq: Once | INTRAVENOUS | Status: DC
  Filled 2018-08-03: qty 5

## 2018-08-03 MED ORDER — INSULIN ASPART 100 UNIT/ML ~~LOC~~ SOLN
2.0000 [IU] | SUBCUTANEOUS | Status: DC
  Administered 2018-08-03 (×2): 4 [IU] via SUBCUTANEOUS
  Administered 2018-08-03: 6 [IU] via SUBCUTANEOUS
  Filled 2018-08-03 (×3): qty 1

## 2018-08-03 MED ORDER — VITAL HIGH PROTEIN PO LIQD
1000.0000 mL | ORAL | Status: DC
  Administered 2018-08-03 – 2018-08-11 (×7): 1000 mL

## 2018-08-03 NOTE — Progress Notes (Signed)
[facility], [place] 08/03/18  Subjective:   Patient remains critically ill requiring ventilator support.  FiO2 35%.  Tube feeds going on 40 cc/h.  Getting IV insulin and famotidine. did not respond to voice or tactile stimuli.  Objective:  Vital signs in last 24 hours:  Temp:  [99.3 F (37.4 C)-99.9 F (37.7 C)] 99.9 F (37.7 C) (11/10 0800) Pulse Rate:  [72-104] 92 (11/10 0800) Resp:  [17-45] 45 (11/10 0800) BP: (131-196)/(47-81) 191/75 (11/10 0845) SpO2:  [93 %-99 %] 96 % (11/10 0739) FiO2 (%):  [35 %-40 %] 35 % (11/10 0739)  Weight change:  Filed Weights   08/19/2018 1728 08/10/2018 2100  Weight: 117 kg 118.5 kg    Intake/Output:    Intake/Output Summary (Last 24 hours) at 08/03/2018 0947 Last data filed at 08/03/2018 0901 Gross per 24 hour  Intake 1298.2 ml  Output 3200 ml  Net -1901.8 ml     Physical Exam: General:  Critically ill-appearing, laying in the bed  HEENT  mouth is closed  Neck  tracheostomy in place  Pulm/lungs  ventilator assisted.  FiO2 35%, no crackles  CVS/Heart  regular, no rub  Abdomen:   Soft, obese  Extremities:  No peripheral edema  Neurologic:  Did not respond to verbal or tactile stimuli  Skin:  Warm, dry  Access:  Left upper extremity developing new  AV fistula, good bruit, thrill could not be felt       Basic Metabolic Panel:  Recent Labs  Lab 08/14/2018 1730 08/02/18 0356 08/02/18 0522 08/02/18 1145 08/02/18 1728 08/02/18 2115 08/03/18 0504  NA 139 144  --   --   --  140  --   K 3.3* 3.1*  --   --   --  3.3*  --   CL 108 108  --   --   --  106  --   CO2 20* 23  --   --   --  23  --   GLUCOSE 143* 244*  --   --   --  387*  --   BUN 37* 39*  --   --   --  48*  --   CREATININE 3.94* 3.78*  --   --   --  3.63*  --   CALCIUM 8.0* 7.8*  --   --   --  8.1*  --   MG  --   --  1.4* 2.5* 2.1  --  2.2  PHOS  --   --  5.9* 5.5* 5.5*  --  3.5     CBC: Recent Labs  Lab 07/28/18 0941 07/25/2018 1730  08/02/18 0356 08/03/18 0504  WBC 10.7* 18.4* 12.1* 11.4*  NEUTROABS  --  16.9*  --   --   HGB 8.9* 9.7* 8.1* 8.7*  HCT 29.0* 32.1* 26.4* 28.2*  MCV 86.3 86.5 86.3 84.4  PLT 276 291 233 286     No results found for: HEPBSAG, HEPBSAB, HEPBIGM    Microbiology:  Recent Results (from the past 240 hour(s))  Urine Culture     Status: None   Collection Time: 08/12/2018  5:32 PM  Result Value Ref Range Status   Specimen Description   Final    URINE, RANDOM Performed at [facility], [address] [place], [place] [address]    Special Requests   Final    NONE Performed at [facility], [address] [place], [place] [address]    Culture  Final    NO GROWTH Performed at [facility], [place] [address] [place], [place] [address]    Report Status 08/02/2018 FINAL  Final  Blood culture (routine x 2)     Status: None (Preliminary result)   Collection Time: 08/17/2018  7:10 PM  Result Value Ref Range Status   Specimen Description BLOOD RIGHT ANTECUBITAL  Final   Special Requests   Final    BOTTLES DRAWN AEROBIC AND ANAEROBIC Blood Culture adequate volume   Culture   Final    NO GROWTH 2 DAYS Performed at [facility], [address] [place], [place] [address]    Report Status PENDING  Incomplete  Blood culture (routine x 2)     Status: None (Preliminary result)   Collection Time: 08/09/2018  7:10 PM  Result Value Ref Range Status   Specimen Description BLOOD BLOOD RIGHT FOREARM  Final   Special Requests   Final    BOTTLES DRAWN AEROBIC AND ANAEROBIC Blood Culture adequate volume   Culture   Final    NO GROWTH 2 DAYS Performed at [facility], [address] [place], [place] [address]    Report Status PENDING  Incomplete  MRSA PCR Screening     Status: None   Collection Time: 08/02/2018  9:05 PM  Result Value Ref Range Status   MRSA by PCR NEGATIVE NEGATIVE Final    Comment:        The GeneXpert MRSA Assay (FDA approved for NASAL  specimens only), is one component of a comprehensive MRSA colonization surveillance program. It is not intended to diagnose MRSA infection nor to guide or monitor treatment for MRSA infections. Performed at [facility], [place] [place], [place] [address]   Culture, respiratory (non-expectorated)     Status: None (Preliminary result)   Collection Time: 08/02/18  5:04 AM  Result Value Ref Range Status   Specimen Description   Final    TRACHEAL ASPIRATE Performed at [facility], [address], [place], [place] [address]    Special Requests   Final    NONE Performed at [facility], [place], [place] [address]    Gram Stain   Final    MODERATE SQUAMOUS EPITHELIAL CELLS PRESENT ABUNDANT WBC PRESENT, PREDOMINANTLY PMN FEW GRAM POSITIVE RODS RARE GRAM POSITIVE COCCI Performed at [facility], [place] [address], [place], [place] [address]    Culture FEW MULTIPLE ORGANISMS PRESENT, NONE PREDOMINANT  Final   Report Status PENDING  Incomplete    Coagulation Studies: No results for input(s): LABPROT, INR in the last 72 hours.  Urinalysis: Recent Labs    07/28/2018 1731  COLORURINE YELLOW*  LABSPEC 1.010  PHURINE 5.0  GLUCOSEU >=500*  HGBUR MODERATE*  BILIRUBINUR NEGATIVE  KETONESUR 5*  PROTEINUR 100*  NITRITE NEGATIVE  LEUKOCYTESUR NEGATIVE      Imaging: Dg Chest 1 View  Result Date: 08/09/2018 CLINICAL DATA:  Altered mental status. EXAM: CHEST  1 VIEW COMPARISON:  Radiographs of July 30, 2013. FINDINGS: Mild cardiomegaly is noted with increased diffuse interstitial densities throughout both lungs concerning for pulmonary edema or possibly inflammation. Tracheostomy tube is in grossly good position. No pneumothorax or pleural effusion is noted. Bony thorax is unremarkable. IMPRESSION: Cardiomegaly is noted with diffuse interstitial densities throughout both lungs concerning for pulmonary edema or possibly  inflammation. Electronically Signed   By: Marijo Conception, M.D.   On: 08/07/2018 18:07   Dg Abd 1  View  Result Date: 08/02/2018 CLINICAL DATA:  Nasogastric tube placement. EXAM: ABDOMEN - 1 VIEW COMPARISON:  CT of the abdomen and pelvis from 12/15/2010 FINDINGS: The patient's enteric tube is noted ending overlying the body of the stomach. The visualized bowel gas pattern is grossly unremarkable. The lung bases are difficult to fully characterize. No acute osseous abnormalities are seen. IMPRESSION: Enteric tube noted ending overlying the body of the stomach. Electronically Signed   By: Garald Balding M.D.   On: 08/02/2018 06:23   Ct Head Wo Contrast  Result Date: 07/27/2018 CLINICAL DATA:  57 y/o M; found at home unconscious and unresponsive. Seizure-like activity. EXAM: CT HEAD WITHOUT CONTRAST CT CERVICAL SPINE WITHOUT CONTRAST TECHNIQUE: Multidetector CT imaging of the head and cervical spine was performed following the standard protocol without intravenous contrast. Multiplanar CT image reconstructions of the cervical spine were also generated. COMPARISON:  05/17/2013 CT head FINDINGS: CT HEAD FINDINGS Brain: No evidence of acute infarction, hemorrhage, hydrocephalus, extra-axial collection or mass lesion/mass effect. 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 Vascular: Calcific atherosclerosis of carotid siphons. No hyperdense vessel identified. Skull: Normal. Negative for fracture or focal lesion. Sinuses/Orbits: Mild paranasal sinus mucosal thickening. Normal aeration of mastoid air cells. Other: None. CT CERVICAL SPINE FINDINGS Alignment: Normal. Skull base and vertebrae: No acute fracture. No primary bone lesion or focal pathologic process. Soft tissues and spinal canal: No prevertebral fluid or swelling. No visible canal hematoma. Disc levels: Mild cervical spondylosis with discogenic degenerative changes greatest at the C6-7 level. No significant bony foraminal or canal  stenosis. Upper chest: Negative. Other: Tracheostomy tube in situ. Calcific atherosclerosis of carotid bifurcations. IMPRESSION: CT head: 1. No acute intracranial abnormality identified. 2. Mild paranasal sinus disease. CT cervical spine: 1. No acute fracture or dislocation identified. 2. Mild cervical spondylosis greatest at C6-7 level. 3. Calcific atherosclerosis of carotid bifurcations. Electronically Signed   By: Kristine Garbe M.D.   On: 07/25/2018 18:12   Ct Cervical Spine Wo Contrast  Result Date: 08/03/2018 CLINICAL DATA:  57 y/o M; found at home unconscious and unresponsive. Seizure-like activity. EXAM: CT HEAD WITHOUT CONTRAST CT CERVICAL SPINE WITHOUT CONTRAST TECHNIQUE: Multidetector CT imaging of the head and cervical spine was performed following the standard protocol without intravenous contrast. Multiplanar CT image reconstructions of the cervical spine were also generated. COMPARISON:  05/17/2013 CT head FINDINGS: CT HEAD FINDINGS Brain: No evidence of acute infarction, hemorrhage, hydrocephalus, extra-axial collection or mass lesion/mass effect. 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 Vascular: Calcific atherosclerosis of carotid siphons. No hyperdense vessel identified. Skull: Normal. Negative for fracture or focal lesion. Sinuses/Orbits: Mild paranasal sinus mucosal thickening. Normal aeration of mastoid air cells. Other: None. CT CERVICAL SPINE FINDINGS Alignment: Normal. Skull base and vertebrae: No acute fracture. No primary bone lesion or focal pathologic process. Soft tissues and spinal canal: No prevertebral fluid or swelling. No visible canal hematoma. Disc levels: Mild cervical spondylosis with discogenic degenerative changes greatest at the C6-7 level. No significant bony foraminal or canal stenosis. Upper chest: Negative. Other: Tracheostomy tube in situ. Calcific atherosclerosis of carotid bifurcations. IMPRESSION: CT head: 1. No acute  intracranial abnormality identified. 2. Mild paranasal sinus disease. CT cervical spine: 1. No acute fracture or dislocation identified. 2. Mild cervical spondylosis greatest at C6-7 level. 3. Calcific atherosclerosis of carotid bifurcations. Electronically Signed   By: Kristine Garbe M.D.   On: 08/17/2018 18:12   Dg Chest Port 1 View  Result Date: 08/02/2018 CLINICAL  DATA:  Acute onset of respiratory failure. EXAM: PORTABLE CHEST 1 VIEW COMPARISON:  Chest radiograph performed 08/09/2018 FINDINGS: The patient's tracheostomy tube is seen ending 7 cm above the carina. The lungs are mildly hypoexpanded. Vascular congestion is seen. Patchy interstitial airspace opacities may reflect interstitial edema or pneumonia, mildly worsened from the prior study. There is no evidence of pleural effusion or pneumothorax. The cardiomediastinal silhouette is borderline normal in size. No acute osseous abnormalities are seen. IMPRESSION: Lungs mildly hypoexpanded. Vascular congestion noted. Patchy interstitial airspace opacities may reflect interstitial edema or pneumonia, mildly worsened from the prior study. Electronically Signed   By: Garald Balding M.D.   On: 08/02/2018 06:24     Medications:   . aztreonam Stopped (08/03/18 0629)  . famotidine (PEPCID) IV 20 mg (08/03/18 0936)  . insulin 3.1 Units/hr (08/03/18 0934)  . nitroGLYCERIN 15 mcg/min (08/02/18 0700)  . [START ON 08/04/2018] vancomycin     . amLODipine  10 mg Per NG tube Daily  . budesonide (PULMICORT) nebulizer solution  0.25 mg Nebulization Q12H  . carvedilol  25 mg Oral BID WC  . chlorhexidine gluconate (MEDLINE KIT)  15 mL Mouth Rinse BID  . feeding supplement (PRO-STAT SUGAR FREE 64)  30 mL Per Tube BID  . feeding supplement (VITAL HIGH PROTEIN)  1,000 mL Per Tube Q24H  . heparin  5,000 Units Subcutaneous Q8H  . ipratropium-albuterol  3 mL Nebulization Q4H  . lisinopril  20 mg Oral Daily  . mouth rinse  15 mL Mouth Rinse 10 times  per day  . sodium chloride flush  3 mL Intravenous Q12H   acetaminophen **OR** acetaminophen, bisacodyl, fentaNYL (SUBLIMAZE) injection, fentaNYL (SUBLIMAZE) injection, hydrALAZINE, midazolam, midazolam, ondansetron **OR** ondansetron (ZOFRAN) IV, sennosides  Assessment/ Plan:  57 y.o. 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  Acute on chronic kidney disease stage IV Baseline creatinine 3.13, GFR 21 from July 07, 2018 Creatinine has now increased to 3.78, No new labs today UOP >3300 cc (foley) Continue hemodynamic support and avoid hypotension and nephrotoxins No acute indication for hemodialysis at present Renal function is close to baseline.  Do not suspect that altered mental status is due to uremia  2.  Altered mental status Differential includes hypoglycemia, hypoxia, seizure Urine drug screen is positive for benzodiazepines neurology evaluation is ongoing  3.  Diabetes type 2, insulin-dependent Poorly controlled Hemoglobin A1c 9.2% on 08/02/2018  4. Hematuria ? Foley trauma    LOS: 2 Myangel Summons Candiss Norse 11/10/20199:47 AM  [place], [place]  Note: This note was prepared with Dragon dictation. Any transcription errors are unintentional

## 2018-08-03 NOTE — Progress Notes (Signed)
[facility] [place] [facility]   PATIENT NAME: Matthew Mayer    MR#:  [id number]  DATE OF BIRTH:  July 24, 1961  SUBJECTIVE:   Remains unresponsive. REVIEW OF SYSTEMS:   Review of Systems  Unable to perform ROS: Intubated   DRUG ALLERGIES:   Allergies  Allergen Reactions  . Augmentin [Amoxicillin-Pot Clavulanate] Anaphylaxis  . Penicillins Anaphylaxis    Has patient had a PCN reaction causing immediate rash, facial/tongue/throat swelling, SOB or lightheadedness with hypotension: Yes Has patient had a PCN reaction causing severe rash involving mucus membranes or skin necrosis: No Has patient had a PCN reaction that required hospitalization: Yes Has patient had a PCN reaction occurring within the last 10 years: Yes If all of the above answers are "NO", then may proceed with Cephalosporin use.   . Codeine Itching  . Biaxin [Clarithromycin] Rash    VITALS:  Blood pressure (!) 176/66, pulse 99, temperature (!) 101.1 F (38.4 C), resp. rate (!) 26, height 5\' 8"  (1.727 m), weight 118.5 kg, SpO2 92 %.  PHYSICAL EXAMINATION:   Physical Exam  GENERAL:  57 y.o.-year-old patient lying in the bed with no acute distress. Critically and chronically ill EYES: Pupils equal, round, reactive to light and accommodation. No scleral icterus. Extraocular muscles intact.  HEENT: Head atraumatic, normocephalic. Oropharynx and nasopharynx clear. Tracheostomy++ NECK:  Supple, no jugular venous distention. No thyroid enlargementTrach ++ LUNGS: Normal breath sounds bilaterally, no wheezing, rales, rhonchi. No use of accessory muscles of respiration.  CARDIOVASCULAR: S1, S2 normal. No murmurs, rubs, or gallops.  ABDOMEN: Soft, nontender, nondistended. Bowel sounds present. No organomegaly or mass.  EXTREMITIES: No cyanosis, clubbing or edema b/l.    NEUROLOGIC: intubated  PSYCHIATRIC:  Intubated  SKIN: No obvious rash, lesion, or ulcer.   LABORATORY PANEL:   CBC Recent Labs  Lab 08/03/18 0504  WBC 11.4*  HGB 8.7*  HCT 28.2*  PLT 286    Chemistries  Recent Labs  Lab 08/02/18 0356  08/02/18 2115 08/03/18 0504  NA 144  --  140  --   K 3.1*  --  3.3*  --   CL 108  --  106  --   CO2 23  --  23  --   GLUCOSE 244*  --  387*  --   BUN 39*  --  48*  --   CREATININE 3.78*  --  3.63*  --   CALCIUM 7.8*  --  8.1*  --   MG  --    < >  --  2.2  AST 22  --   --   --   ALT 16  --   --   --   ALKPHOS 99  --   --   --   BILITOT 1.0  --   --   --    < > = values in this interval not displayed.   Cardiac Enzymes Recent Labs  Lab 08/02/18 0522  TROPONINI 0.06*   RADIOLOGY:  Dg Chest 1 View  Result Date: 08/12/2018 CLINICAL DATA:  Altered mental status. EXAM: CHEST  1 VIEW COMPARISON:  Radiographs of July 30, 2013. FINDINGS: Mild cardiomegaly is noted with increased diffuse interstitial densities throughout both lungs concerning for pulmonary edema or possibly inflammation. Tracheostomy tube is in grossly good position. No pneumothorax or pleural effusion is noted. Bony thorax is unremarkable. IMPRESSION: Cardiomegaly is noted with diffuse interstitial densities throughout both lungs concerning for pulmonary edema or possibly inflammation. Electronically Signed  By: Lupita Raider, M.D.   On: 08/23/2018 18:07   Dg Abd 1 View  Result Date: 08/02/2018 CLINICAL DATA:  Nasogastric tube placement. EXAM: ABDOMEN - 1 VIEW COMPARISON:  CT of the abdomen and pelvis from 12/15/2010 FINDINGS: The patient's enteric tube is noted ending overlying the body of the stomach. The visualized bowel gas pattern is grossly unremarkable. The lung bases are difficult to fully characterize. No acute osseous abnormalities are seen. IMPRESSION: Enteric tube noted ending overlying the body of the stomach. Electronically Signed   By: Roanna Raider M.D.   On: 08/02/2018 06:23   Ct Head Wo Contrast  Result Date: August 23, 2018 CLINICAL DATA:  57 y/o M; found at home  unconscious and unresponsive. Seizure-like activity. EXAM: CT HEAD WITHOUT CONTRAST CT CERVICAL SPINE WITHOUT CONTRAST TECHNIQUE: Multidetector CT imaging of the head and cervical spine was performed following the standard protocol without intravenous contrast. Multiplanar CT image reconstructions of the cervical spine were also generated. COMPARISON:  05/17/2013 CT head FINDINGS: CT HEAD FINDINGS Brain: No evidence of acute infarction, hemorrhage, hydrocephalus, extra-axial collection or mass lesion/mass effect. 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 Vascular: Calcific atherosclerosis of carotid siphons. No hyperdense vessel identified. Skull: Normal. Negative for fracture or focal lesion. Sinuses/Orbits: Mild paranasal sinus mucosal thickening. Normal aeration of mastoid air cells. Other: None. CT CERVICAL SPINE FINDINGS Alignment: Normal. Skull base and vertebrae: No acute fracture. No primary bone lesion or focal pathologic process. Soft tissues and spinal canal: No prevertebral fluid or swelling. No visible canal hematoma. Disc levels: Mild cervical spondylosis with discogenic degenerative changes greatest at the C6-7 level. No significant bony foraminal or canal stenosis. Upper chest: Negative. Other: Tracheostomy tube in situ. Calcific atherosclerosis of carotid bifurcations. IMPRESSION: CT head: 1. No acute intracranial abnormality identified. 2. Mild paranasal sinus disease. CT cervical spine: 1. No acute fracture or dislocation identified. 2. Mild cervical spondylosis greatest at C6-7 level. 3. Calcific atherosclerosis of carotid bifurcations. Electronically Signed   By: Mitzi Hansen M.D.   On: 2018-08-23 18:12   Ct Cervical Spine Wo Contrast  Result Date: 08-23-2018 CLINICAL DATA:  57 y/o M; found at home unconscious and unresponsive. Seizure-like activity. EXAM: CT HEAD WITHOUT CONTRAST CT CERVICAL SPINE WITHOUT CONTRAST TECHNIQUE: Multidetector CT imaging of  the head and cervical spine was performed following the standard protocol without intravenous contrast. Multiplanar CT image reconstructions of the cervical spine were also generated. COMPARISON:  05/17/2013 CT head FINDINGS: CT HEAD FINDINGS Brain: No evidence of acute infarction, hemorrhage, hydrocephalus, extra-axial collection or mass lesion/mass effect. 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 Vascular: Calcific atherosclerosis of carotid siphons. No hyperdense vessel identified. Skull: Normal. Negative for fracture or focal lesion. Sinuses/Orbits: Mild paranasal sinus mucosal thickening. Normal aeration of mastoid air cells. Other: None. CT CERVICAL SPINE FINDINGS Alignment: Normal. Skull base and vertebrae: No acute fracture. No primary bone lesion or focal pathologic process. Soft tissues and spinal canal: No prevertebral fluid or swelling. No visible canal hematoma. Disc levels: Mild cervical spondylosis with discogenic degenerative changes greatest at the C6-7 level. No significant bony foraminal or canal stenosis. Upper chest: Negative. Other: Tracheostomy tube in situ. Calcific atherosclerosis of carotid bifurcations. IMPRESSION: CT head: 1. No acute intracranial abnormality identified. 2. Mild paranasal sinus disease. CT cervical spine: 1. No acute fracture or dislocation identified. 2. Mild cervical spondylosis greatest at C6-7 level. 3. Calcific atherosclerosis of carotid bifurcations. Electronically Signed   By: Buzzy Han.D.  On: 08/07/2018 18:12   Dg Chest Port 1 View  Result Date: 08/02/2018 CLINICAL DATA:  Acute onset of respiratory failure. EXAM: PORTABLE CHEST 1 VIEW COMPARISON:  Chest radiograph performed 08/03/2018 FINDINGS: The patient's tracheostomy tube is seen ending 7 cm above the carina. The lungs are mildly hypoexpanded. Vascular congestion is seen. Patchy interstitial airspace opacities may reflect interstitial edema or pneumonia, mildly  worsened from the prior study. There is no evidence of pleural effusion or pneumothorax. The cardiomediastinal silhouette is borderline normal in size. No acute osseous abnormalities are seen. IMPRESSION: Lungs mildly hypoexpanded. Vascular congestion noted. Patchy interstitial airspace opacities may reflect interstitial edema or pneumonia, mildly worsened from the prior study. Electronically Signed   By: Roanna Raider M.D.   On: 08/02/2018 06:24   ASSESSMENT AND PLAN:  Matthew Mayer  is a 57 y.o. male with a known history of CKD stage IV, hypertension, diabetes mellitus, COPD with tracheostomy who had AV fistula surgery 2 days back presents to the hospital brought in by EMS after he was found unresponsive by family.  He was found to have blood glucose of 42 along with oxygen saturations in the 70s.  Unclear of how long patient was down for.  He was given dextrose and put on oxygen and brought to the emergency room.   *Severe sepsis with bilateral pneumonia and acute on chronic hypoxic respiratory failure with acute encephalopathy Patient is critically ill.  -  Patient no on full ventilatory support via his tracheostomy since he was using accessory muscles and is unresponsive to respiratory failure. -No IV fluids due to pulmonary edema. --Broad-spectrum IV antibiotics vanc and aztreonam  *Acute on chronic diastolic congestive heart failure. -  Will give stat dose of IV Lasix.  Monitor renal function.   -Foley catheter placed.  *CKD stage IV.   - Consult nephrology if dialysis is needed. -Patient had fistula placement in preparation for hemodialysis two days ago. -Received nephrology input   *Hypoglycemia with diabetes mellitus.  - resolved  *Hypertension.  IV hydralazine rn  *DVT prophylaxis with heparin  Discussed with wife in the room  Case discussed with Care Management/Social Worker. Management plans discussed with the patient, family and they are in agreement.  CODE  STATUS:full  DVT Prophylaxis: heparin  TOTAL TIME TAKING CARE OF THIS PATIENT: *30* minutes.  >50% time spent on counselling and coordination of care  POSSIBLE D/C IN few* DAYS, DEPENDING ON CLINICAL CONDITION.  Note: This dictation was prepared with Dragon dictation along with smaller phrase technology. Any transcriptional errors that result from this process are unintentional.  Enedina Finner M.D on 08/03/2018 at 1:45 PM  Between 7am to 6pm - Pager - [phone]  After 6pm go to [email or web address] - Social research officer, government  [facility]  Office  [phone]  CC: Primary care physician; Marina Goodell, MDPatient ID: Matthew Mayer, male   DOB: [birth year], 57 y.o.   MRN: [id number]

## 2018-08-03 NOTE — Progress Notes (Signed)
Pt BP 196/73 and HR 103. Wife at the bedside says pt looks more swollen today. MD notified, lasix and metoprolol ordered. Will carry out MD orders and continue to monitor.

## 2018-08-03 NOTE — Progress Notes (Signed)
Follow up - Critical Care Medicine Note  Patient Details:    Matthew Mayer is an 57 y.o. male. presented to the ED after being found unresponsive. Of note, patient had an AV fistula placed 2 days ago [07/30/2018] in preparation for hemodialysis down the road. He has a history of CKD and is being seen by nephrology. History is obtained from EMS and ED records as patient is currently on full vent support and unresponsive. Per ED records, EMS was called after patient was found unresponsive at home. When EMS arrived, his blood sugar was 45 and spouse reported that patient had a seizure-like activity and EMS noted posturing. They administered some dextrose and Versed and his blood sugar came up to 200s. Upon arrival in the ED, his blood sugar was down to 135. His SPO2 upon EMS arrival was in the 70s and he was off his oxygen. He uses oxygen via tracheostomy around-the-clock at home. His ED work-up included a CT head that was negative. His chest x-ray showed bilateral pulmonary infiltrates with concern for pneumonia versus pulmonary edema, leukocytosis with WBC of 18.4K, creatinine of 3.94, and a potassium level of 3.3.   Lines, Airways, Drains: NG/OG Tube Nasogastric 16 Fr. Left nare Xray Documented cm marking at nare/ corner of mouth 60 cm (Active)  Cm Marking at Nare/Corner of Mouth (if applicable) 60 cm 08/03/2018  8:28 AM  Site Assessment Clean;Dry;Intact 08/03/2018  8:28 AM  Ongoing Placement Verification No change in cm markings or external length of tube from initial placement 08/03/2018  8:28 AM  Status Infusing tube feed 08/03/2018  8:28 AM  Drainage Appearance Bile 08/02/2018  5:00 PM     Urethral Catheter jessica rn Latex 16 Fr. (Active)  Indication for Insertion or Continuance of Catheter Unstable critical patients (first 24-48 hours) 08/03/2018  8:28 AM  Site Assessment Clean;Intact 08/03/2018  8:28 AM  Catheter Maintenance Bag below level of bladder;Catheter secured;Drainage  bag/tubing not touching floor;Insertion date on drainage bag;No dependent loops;Seal intact;Bag emptied prior to transport 08/03/2018  8:28 AM  Collection Container Standard drainage bag 08/03/2018  8:28 AM  Securement Method Securing device (Describe) 08/03/2018  8:28 AM  Urinary Catheter Interventions Unclamped 08/03/2018  6:00 AM  Output (mL) 225 mL 08/03/2018  6:00 AM    Anti-infectives:  Anti-infectives (From admission, onward)   Start     Dose/Rate Route Frequency Ordered Stop   08/04/18 0321  vancomycin (VANCOCIN) 1,500 mg in sodium chloride 0.9 % 500 mL IVPB     1,500 mg 250 mL/hr over 120 Minutes Intravenous Every 48 hours 08/02/18 1415     08/02/18 0600  aztreonam (AZACTAM) injection 1 g  Status:  Discontinued     1 g Intramuscular Every 8 hours 08/06/2018 2011 08/02/18 0448   08/02/18 0600  aztreonam (AZACTAM) 1 g in sodium chloride 0.9 % 100 mL IVPB     1 g 200 mL/hr over 30 Minutes Intravenous Every 8 hours 08/02/18 0448     08/02/18 0400  vancomycin (VANCOCIN) 1,500 mg in sodium chloride 0.9 % 500 mL IVPB  Status:  Discontinued     1,500 mg 250 mL/hr over 120 Minutes Intravenous Every 24 hours 08/20/2018 2011 08/02/18 1415   08/10/2018 1815  aztreonam (AZACTAM) 2 g in sodium chloride 0.9 % 100 mL IVPB     2 g 200 mL/hr over 30 Minutes Intravenous  Once 07/26/2018 1801 07/30/2018 1946   08/07/2018 1815  metroNIDAZOLE (FLAGYL) IVPB 500 mg  Status:  Discontinued  500 mg 100 mL/hr over 60 Minutes Intravenous Every 8 hours 08-30-2018 1801 08/02/18 1502   30-Aug-2018 1815  vancomycin (VANCOCIN) IVPB 1000 mg/200 mL premix     1,000 mg 200 mL/hr over 60 Minutes Intravenous  Once 2018/08/30 1801 August 30, 2018 2120      Microbiology: Results for orders placed or performed during the hospital encounter of 08-30-2018  Urine Culture     Status: None   Collection Time: 08/30/2018  5:32 PM  Result Value Ref Range Status   Specimen Description   Final    URINE, RANDOM Performed at [facility],  [address] [place], [place] [address]    Special Requests   Final    NONE Performed at [facility], [address] [place], [place] [address]    Culture   Final    NO GROWTH Performed at [facility], [address], [place], [place] [address]    Report Status 08/02/2018 FINAL  Final  Blood culture (routine x 2)     Status: None (Preliminary result)   Collection Time: 08/30/2018  7:10 PM  Result Value Ref Range Status   Specimen Description BLOOD RIGHT ANTECUBITAL  Final   Special Requests   Final    BOTTLES DRAWN AEROBIC AND ANAEROBIC Blood Culture adequate volume   Culture   Final    NO GROWTH 2 DAYS Performed at [facility], [address], [place], [place] [address]    Report Status PENDING  Incomplete  Blood culture (routine x 2)     Status: None (Preliminary result)   Collection Time: 30-Aug-2018  7:10 PM  Result Value Ref Range Status   Specimen Description BLOOD BLOOD RIGHT FOREARM  Final   Special Requests   Final    BOTTLES DRAWN AEROBIC AND ANAEROBIC Blood Culture adequate volume   Culture   Final    NO GROWTH 2 DAYS Performed at [facility], [address], [place], [place] [address]    Report Status PENDING  Incomplete  MRSA PCR Screening     Status: None   Collection Time: 30-Aug-2018  9:05 PM  Result Value Ref Range Status   MRSA by PCR NEGATIVE NEGATIVE Final    Comment:        The GeneXpert MRSA Assay (FDA approved for NASAL specimens only), is one component of a comprehensive MRSA colonization surveillance program. It is not intended to diagnose MRSA infection nor to guide or monitor treatment for MRSA infections. Performed at [facility], [address] [place], [place] [address]   Culture, respiratory (non-expectorated)     Status: None (Preliminary result)   Collection Time: 08/02/18  5:04 AM  Result Value Ref Range Status   Specimen Description   Final    TRACHEAL ASPIRATE Performed at  [facility], [address] [place], [place] [address]    Special Requests   Final    NONE Performed at [facility], [address] [place], [place] [address]    Gram Stain   Final    MODERATE SQUAMOUS EPITHELIAL CELLS PRESENT ABUNDANT WBC PRESENT, PREDOMINANTLY PMN FEW GRAM POSITIVE RODS RARE GRAM POSITIVE COCCI Performed at [facility], [address], [place], [place] [address]    Culture PENDING  Incomplete   Report Status PENDING  Incomplete    Studies: Dg Chest 1 View  Result Date: 08/30/18 CLINICAL DATA:  Altered mental status. EXAM: CHEST  1 VIEW COMPARISON:  Radiographs of July 30, 2013. FINDINGS: Mild cardiomegaly is noted with increased diffuse interstitial densities throughout both lungs concerning for pulmonary edema or possibly inflammation. Tracheostomy tube is in grossly good position. No pneumothorax or pleural effusion is noted. Bony thorax is unremarkable. IMPRESSION: Cardiomegaly is noted with diffuse interstitial densities throughout both lungs concerning for pulmonary edema or possibly inflammation. Electronically Signed   By: Lupita Raider, M.D.   On: 08/02/2018 18:07   Dg Abd 1 View  Result Date: 08/02/2018 CLINICAL DATA:  Nasogastric tube placement. EXAM: ABDOMEN - 1 VIEW COMPARISON:  CT of the abdomen and pelvis from 12/15/2010 FINDINGS: The patient's enteric tube is noted ending overlying the body of the stomach. The visualized bowel gas pattern is grossly unremarkable. The lung bases are difficult to fully characterize. No acute osseous abnormalities are seen. IMPRESSION: Enteric tube noted ending overlying the body of the stomach. Electronically Signed   By: Roanna Raider M.D.   On: 08/02/2018 06:23   Ct Head Wo Contrast  Result Date: 08/11/2018 CLINICAL DATA:  57 y/o M; found at home unconscious and unresponsive. Seizure-like activity. EXAM: CT HEAD WITHOUT CONTRAST CT CERVICAL SPINE WITHOUT CONTRAST TECHNIQUE:  Multidetector CT imaging of the head and cervical spine was performed following the standard protocol without intravenous contrast. Multiplanar CT image reconstructions of the cervical spine were also generated. COMPARISON:  05/17/2013 CT head FINDINGS: CT HEAD FINDINGS Brain: No evidence of acute infarction, hemorrhage, hydrocephalus, extra-axial collection or mass lesion/mass effect. 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 Vascular: Calcific atherosclerosis of carotid siphons. No hyperdense vessel identified. Skull: Normal. Negative for fracture or focal lesion. Sinuses/Orbits: Mild paranasal sinus mucosal thickening. Normal aeration of mastoid air cells. Other: None. CT CERVICAL SPINE FINDINGS Alignment: Normal. Skull base and vertebrae: No acute fracture. No primary bone lesion or focal pathologic process. Soft tissues and spinal canal: No prevertebral fluid or swelling. No visible canal hematoma. Disc levels: Mild cervical spondylosis with discogenic degenerative changes greatest at the C6-7 level. No significant bony foraminal or canal stenosis. Upper chest: Negative. Other: Tracheostomy tube in situ. Calcific atherosclerosis of carotid bifurcations. IMPRESSION: CT head: 1. No acute intracranial abnormality identified. 2. Mild paranasal sinus disease. CT cervical spine: 1. No acute fracture or dislocation identified. 2. Mild cervical spondylosis greatest at C6-7 level. 3. Calcific atherosclerosis of carotid bifurcations. Electronically Signed   By: Mitzi Hansen M.D.   On: 08/02/2018 18:12   Ct Cervical Spine Wo Contrast  Result Date: 07/28/2018 CLINICAL DATA:  57 y/o M; found at home unconscious and unresponsive. Seizure-like activity. EXAM: CT HEAD WITHOUT CONTRAST CT CERVICAL SPINE WITHOUT CONTRAST TECHNIQUE: Multidetector CT imaging of the head and cervical spine was performed following the standard protocol without intravenous contrast. Multiplanar CT image  reconstructions of the cervical spine were also generated. COMPARISON:  05/17/2013 CT head FINDINGS: CT HEAD FINDINGS Brain: No evidence of acute infarction, hemorrhage, hydrocephalus, extra-axial collection or mass lesion/mass effect. 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 Vascular: Calcific atherosclerosis of carotid siphons. No hyperdense vessel identified. Skull: Normal. Negative for fracture or focal lesion. Sinuses/Orbits: Mild paranasal sinus mucosal thickening. Normal aeration of mastoid air cells. Other: None. CT CERVICAL SPINE FINDINGS Alignment: Normal. Skull base and vertebrae: No acute fracture. No primary bone lesion or focal pathologic process. Soft tissues and spinal canal: No prevertebral fluid or swelling. No visible canal hematoma. Disc levels: Mild cervical spondylosis with discogenic degenerative changes greatest at the C6-7 level. No significant bony foraminal or canal  stenosis. Upper chest: Negative. Other: Tracheostomy tube in situ. Calcific atherosclerosis of carotid bifurcations. IMPRESSION: CT head: 1. No acute intracranial abnormality identified. 2. Mild paranasal sinus disease. CT cervical spine: 1. No acute fracture or dislocation identified. 2. Mild cervical spondylosis greatest at C6-7 level. 3. Calcific atherosclerosis of carotid bifurcations. Electronically Signed   By: Mitzi Hansen M.D.   On: 08/10/2018 18:12   Dg Chest Port 1 View  Result Date: 08/02/2018 CLINICAL DATA:  Acute onset of respiratory failure. EXAM: PORTABLE CHEST 1 VIEW COMPARISON:  Chest radiograph performed 07/27/2018 FINDINGS: The patient's tracheostomy tube is seen ending 7 cm above the carina. The lungs are mildly hypoexpanded. Vascular congestion is seen. Patchy interstitial airspace opacities may reflect interstitial edema or pneumonia, mildly worsened from the prior study. There is no evidence of pleural effusion or pneumothorax. The cardiomediastinal silhouette is  borderline normal in size. No acute osseous abnormalities are seen. IMPRESSION: Lungs mildly hypoexpanded. Vascular congestion noted. Patchy interstitial airspace opacities may reflect interstitial edema or pneumonia, mildly worsened from the prior study. Electronically Signed   By: Roanna Raider M.D.   On: 08/02/2018 06:24    Consults: Treatment Team:  Thana Farr, MD Mosetta Pigeon, MD   Subjective:    Overnight Issues: No significant changes overnight patient is still unresponsive, has been hypertensive and has had uncontrolled blood sugar elevation on an insulin drip  Objective:  Vital signs for last 24 hours: Temp:  [99.3 F (37.4 C)-99.9 F (37.7 C)] 99.9 F (37.7 C) (11/10 0800) Pulse Rate:  [72-104] 92 (11/10 0800) Resp:  [17-45] 45 (11/10 0800) BP: (131-196)/(47-81) 191/75 (11/10 0845) SpO2:  [93 %-99 %] 96 % (11/10 0739) FiO2 (%):  [35 %-40 %] 35 % (11/10 0739)  Hemodynamic parameters for last 24 hours:    Intake/Output from previous day: 11/09 0701 - 11/10 0700 In: 1398.2 [I.V.:133.9; NG/GT:764; IV Piggyback:500.3] Out: 3300 [Urine:3300]  Intake/Output this shift: No intake/output data recorded.  Vent settings for last 24 hours: Vent Mode: PRVC FiO2 (%):  [35 %-40 %] 35 % Set Rate:  [12 bmp] 12 bmp Vt Set:  [450 mL] 450 mL PEEP:  [5 cmH20] 5 cmH20 Plateau Pressure:  [10 cmH20-14 cmH20] 10 cmH20  Physical Exam:  Vital signs: Please see the above listed vital signs HEENT: Tracheostomy in place on mechanical ventilation, no jugular venous distention is noted Cardiovascular: Regular rate and rhythm Pulmonary: Coarse rhonchi diffusely right greater than left Abdominal: Positive bowel sounds soft nontender exam Extremities: No clubbing or cyanosis, minimal edema Neurologic: Patient is unresponsive not on sedation  Assessment/Plan:   Respiratory failure.  Patient with multi focal pneumonia.  Presently on aztreonam and vancomycin, pending sputum, urinary  Legionella and pneumococcus  End-stage renal disease.  Patient has had fistula placed nephrology consult, does not need acute hemodialysis, potassium 3.3, BUN 48, creatinine 3.63 with an anion gap of 11  Altered mental status.  Patient has not had any improvement.  Patient was hypoglycemic when found and also could have suffered anoxic brain injury.  Hopefully as encephalopathy improves mental status will improve.  Appreciate neurology's input, if no improvement will CT scan/MRI  Uncontrolled diabetes.  On insulin infusion protocol  Hypertensive emergency.  Will add Norvasc and wean nitroglycerin if unable to will change to Cardene  Critical Care Total Time 35 minutes  Hetal Proano 08/03/2018  *Care during the described time interval was provided by me and/or other providers on the critical care team.  I have reviewed this patient's available  data, including medical history, events of note, physical examination and test results as part of my evaluation.

## 2018-08-03 NOTE — Progress Notes (Signed)
No improvement today and not following commands. Occasionally moving LLE spontaneously.   Past Medical History:  Diagnosis Date  . Anemia   . Chronic kidney disease    stage 4  . COPD (chronic obstructive pulmonary disease) (HCC)   . Depression   . Diabetes (HCC)   . GERD (gastroesophageal reflux disease)   . Hyperlipidemia   . Hypertension   . MRSA (methicillin resistant Staphylococcus aureus)    pt states had in past  . Sleep apnea     Past Surgical History:  Procedure Laterality Date  . AV FISTULA INSERTION W/ RF MAGNETIC GUIDANCE Left 07/30/2018   Procedure: AV FISTULA INSERTION W/RF MAGNETIC GUIDANCE;  Surgeon: Renford Dills, MD;  Location: [facility] INVASIVE CV LAB;  Service: Cardiovascular;  Laterality: Left;  . CHOLECYSTECTOMY    . EXPLORATORY LAPAROTOMY    . Hand ganglion cyst Left   . laryngoscopy w/excision &/or aspiration  12/04/2013  . TRACHEOSTOMY      Family History  Problem Relation Age of Onset  . Coronary artery disease Mother   . Hypertension Mother   . Osteoporosis Mother   . Heart attack Mother   . Diabetes Father   . Hypertension Father   . CVA Father   . Stroke Father   . Coronary artery disease Sister   . Diabetes Sister   . Hearing loss Sister   . Hyperlipidemia Sister   . Lung disease Sister   . Stroke Sister   . Colon polyps Brother   . Diabetes Brother   . Hearing loss Brother   . Hypertension Brother   . Cancer Maternal Grandmother   . Heart disease Maternal Grandfather   . Diabetes Paternal Grandmother   . Diabetes Paternal Grandfather     Social History:  reports that he quit smoking about 5 years ago. His smoking use included cigarettes. He has a 114.00 pack-year smoking history. He has never used smokeless tobacco. He reports that he drank alcohol. He reports that he does not use drugs.  Allergies  Allergen Reactions  . Augmentin [Amoxicillin-Pot Clavulanate] Anaphylaxis  . Penicillins Anaphylaxis    Has patient had a PCN  reaction causing immediate rash, facial/tongue/throat swelling, SOB or lightheadedness with hypotension: Yes Has patient had a PCN reaction causing severe rash involving mucus membranes or skin necrosis: No Has patient had a PCN reaction that required hospitalization: Yes Has patient had a PCN reaction occurring within the last 10 years: Yes If all of the above answers are "NO", then may proceed with Cephalosporin use.   . Codeine Itching  . Biaxin [Clarithromycin] Rash    Medications: I have reviewed the patient's current medications.  ROS: Unable to obtain as not following commands   Physical Examination: Blood pressure (!) 176/66, pulse 99, temperature (!) 101.1 F (38.4 C), resp. rate (!) 26, height 5\' 8"  (1.727 m), weight 118.5 kg, SpO2 92 %.  Does not open eyes Withdrawal from painful stimuli Positive cough and gag  Laboratory Studies:   Basic Metabolic Panel: Recent Labs  Lab 08/03/2018 1730 08/02/18 0356 08/02/18 0522 08/02/18 1145 08/02/18 1728 08/02/18 2115 08/03/18 0504  NA 139 144  --   --   --  140  --   K 3.3* 3.1*  --   --   --  3.3*  --   CL 108 108  --   --   --  106  --   CO2 20* 23  --   --   --  23  --   GLUCOSE 143* 244*  --   --   --  387*  --   BUN 37* 39*  --   --   --  48*  --   CREATININE 3.94* 3.78*  --   --   --  3.63*  --   CALCIUM 8.0* 7.8*  --   --   --  8.1*  --   MG  --   --  1.4* 2.5* 2.1  --  2.2  PHOS  --   --  5.9* 5.5* 5.5*  --  3.5    Liver Function Tests: Recent Labs  Lab 07/28/2018 1730 08/02/18 0356  AST 30 22  ALT 17 16  ALKPHOS 124 99  BILITOT 0.7 1.0  PROT 7.7 6.8  ALBUMIN 2.5* 2.0*   Recent Labs  Lab 07/27/2018 1730  LIPASE 35   Recent Labs  Lab 08/21/2018 1730  AMMONIA 19    CBC: Recent Labs  Lab 07/28/18 0941 08/12/2018 1730 08/02/18 0356 08/03/18 0504  WBC 10.7* 18.4* 12.1* 11.4*  NEUTROABS  --  16.9*  --   --   HGB 8.9* 9.7* 8.1* 8.7*  HCT 29.0* 32.1* 26.4* 28.2*  MCV 86.3 86.5 86.3 84.4  PLT 276  291 233 286    Cardiac Enzymes: Recent Labs  Lab 08/23/2018 1730 08/02/18 0522  TROPONINI 0.03* 0.06*    BNP: Invalid input(s): POCBNP  CBG: Recent Labs  Lab 08/03/18 0547 08/03/18 0717 08/03/18 0932 08/03/18 1018 08/03/18 1135  GLUCAP 134* 165* 141* 161* 148*    Microbiology: Results for orders placed or performed during the hospital encounter of 08/21/2018  Urine Culture     Status: None   Collection Time: 07/28/2018  5:32 PM  Result Value Ref Range Status   Specimen Description   Final    URINE, RANDOM Performed at [facility], [address], [place], [place] [address]    Special Requests   Final    NONE Performed at [facility], [address], [place], [place] [address]    Culture   Final    NO GROWTH Performed at [facility], [address], [place], [place] [address]    Report Status 08/02/2018 FINAL  Final  Blood culture (routine x 2)     Status: None (Preliminary result)   Collection Time: 08/17/2018  7:10 PM  Result Value Ref Range Status   Specimen Description BLOOD RIGHT ANTECUBITAL  Final   Special Requests   Final    BOTTLES DRAWN AEROBIC AND ANAEROBIC Blood Culture adequate volume   Culture   Final    NO GROWTH 2 DAYS Performed at [facility], [address], [place], [place] [address]    Report Status PENDING  Incomplete  Blood culture (routine x 2)     Status: None (Preliminary result)   Collection Time: 08/16/2018  7:10 PM  Result Value Ref Range Status   Specimen Description BLOOD BLOOD RIGHT FOREARM  Final   Special Requests   Final    BOTTLES DRAWN AEROBIC AND ANAEROBIC Blood Culture adequate volume   Culture   Final    NO GROWTH 2 DAYS Performed at [facility], [address], [place], [place] [address]    Report Status PENDING  Incomplete  MRSA PCR Screening     Status: None   Collection Time: 08/04/2018  9:05 PM  Result Value Ref Range Status   MRSA by PCR NEGATIVE NEGATIVE Final  Comment:        The GeneXpert MRSA Assay (FDA approved for NASAL specimens only), is one component of a comprehensive MRSA colonization surveillance program. It is not intended to diagnose MRSA infection nor to guide or monitor treatment for MRSA infections. Performed at [facility], [address] [place], [place] [address]   Culture, respiratory (non-expectorated)     Status: None (Preliminary result)   Collection Time: 08/02/18  5:04 AM  Result Value Ref Range Status   Specimen Description   Final    TRACHEAL ASPIRATE Performed at [facility], [address] [place], [place] [address]    Special Requests   Final    NONE Performed at [facility], [address] [place], [place] [address]    Gram Stain   Final    MODERATE SQUAMOUS EPITHELIAL CELLS PRESENT ABUNDANT WBC PRESENT, PREDOMINANTLY PMN FEW GRAM POSITIVE RODS RARE GRAM POSITIVE COCCI Performed at [facility], [address], [place], [place] [address]    Culture FEW MULTIPLE ORGANISMS PRESENT, NONE PREDOMINANT  Final   Report Status PENDING  Incomplete    Coagulation Studies: No results for input(s): LABPROT, INR in the last 72 hours.  Urinalysis:  Recent Labs  Lab 08/04/2018 1731  COLORURINE YELLOW*  LABSPEC 1.010  PHURINE 5.0  GLUCOSEU >=500*  HGBUR MODERATE*  BILIRUBINUR NEGATIVE  KETONESUR 5*  PROTEINUR 100*  NITRITE NEGATIVE  LEUKOCYTESUR NEGATIVE    Lipid Panel:     Component Value Date/Time   TRIG 171 05/25/2013 0410    HgbA1C:  Lab Results  Component Value Date   HGBA1C 9.2 (H) 08/02/2018    Urine Drug Screen:      Component Value Date/Time   LABOPIA NONE DETECTED 08/02/2018 1026   COCAINSCRNUR NONE DETECTED 08/02/2018 1026   LABBENZ POSITIVE (A) 08/02/2018 1026   AMPHETMU NONE DETECTED 08/02/2018 1026   THCU NONE DETECTED 08/02/2018 1026   LABBARB NONE DETECTED 08/02/2018 1026    Alcohol Level: No results for input(s): ETH in the last  168 hours.    Imaging: Dg Chest 1 View  Result Date: 08/04/2018 CLINICAL DATA:  Altered mental status. EXAM: CHEST  1 VIEW COMPARISON:  Radiographs of July 30, 2013. FINDINGS: Mild cardiomegaly is noted with increased diffuse interstitial densities throughout both lungs concerning for pulmonary edema or possibly inflammation. Tracheostomy tube is in grossly good position. No pneumothorax or pleural effusion is noted. Bony thorax is unremarkable. IMPRESSION: Cardiomegaly is noted with diffuse interstitial densities throughout both lungs concerning for pulmonary edema or possibly inflammation. Electronically Signed   By: Lupita Raider, M.D.   On: 08/23/2018 18:07   Dg Abd 1 View  Result Date: 08/02/2018 CLINICAL DATA:  Nasogastric tube placement. EXAM: ABDOMEN - 1 VIEW COMPARISON:  CT of the abdomen and pelvis from 12/15/2010 FINDINGS: The patient's enteric tube is noted ending overlying the body of the stomach. The visualized bowel gas pattern is grossly unremarkable. The lung bases are difficult to fully characterize. No acute osseous abnormalities are seen. IMPRESSION: Enteric tube noted ending overlying the body of the stomach. Electronically Signed   By: Roanna Raider M.D.   On: 08/02/2018 06:23   Ct Head Wo Contrast  Result Date: 07/30/2018 CLINICAL DATA:  57 y/o M; found at home unconscious and unresponsive. Seizure-like activity. EXAM: CT HEAD WITHOUT CONTRAST CT CERVICAL SPINE WITHOUT CONTRAST TECHNIQUE: Multidetector CT imaging of the head and cervical spine was performed following the standard protocol without intravenous  contrast. Multiplanar CT image reconstructions of the cervical spine were also generated. COMPARISON:  05/17/2013 CT head FINDINGS: CT HEAD FINDINGS Brain: No evidence of acute infarction, hemorrhage, hydrocephalus, extra-axial collection or mass lesion/mass effect. 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 Vascular: Calcific atherosclerosis  of carotid siphons. No hyperdense vessel identified. Skull: Normal. Negative for fracture or focal lesion. Sinuses/Orbits: Mild paranasal sinus mucosal thickening. Normal aeration of mastoid air cells. Other: None. CT CERVICAL SPINE FINDINGS Alignment: Normal. Skull base and vertebrae: No acute fracture. No primary bone lesion or focal pathologic process. Soft tissues and spinal canal: No prevertebral fluid or swelling. No visible canal hematoma. Disc levels: Mild cervical spondylosis with discogenic degenerative changes greatest at the C6-7 level. No significant bony foraminal or canal stenosis. Upper chest: Negative. Other: Tracheostomy tube in situ. Calcific atherosclerosis of carotid bifurcations. IMPRESSION: CT head: 1. No acute intracranial abnormality identified. 2. Mild paranasal sinus disease. CT cervical spine: 1. No acute fracture or dislocation identified. 2. Mild cervical spondylosis greatest at C6-7 level. 3. Calcific atherosclerosis of carotid bifurcations. Electronically Signed   By: Mitzi Hansen M.D.   On: 08/05/2018 18:12   Ct Cervical Spine Wo Contrast  Result Date: 08/17/2018 CLINICAL DATA:  57 y/o M; found at home unconscious and unresponsive. Seizure-like activity. EXAM: CT HEAD WITHOUT CONTRAST CT CERVICAL SPINE WITHOUT CONTRAST TECHNIQUE: Multidetector CT imaging of the head and cervical spine was performed following the standard protocol without intravenous contrast. Multiplanar CT image reconstructions of the cervical spine were also generated. COMPARISON:  05/17/2013 CT head FINDINGS: CT HEAD FINDINGS Brain: No evidence of acute infarction, hemorrhage, hydrocephalus, extra-axial collection or mass lesion/mass effect. 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 Vascular: Calcific atherosclerosis of carotid siphons. No hyperdense vessel identified. Skull: Normal. Negative for fracture or focal lesion. Sinuses/Orbits: Mild paranasal sinus mucosal  thickening. Normal aeration of mastoid air cells. Other: None. CT CERVICAL SPINE FINDINGS Alignment: Normal. Skull base and vertebrae: No acute fracture. No primary bone lesion or focal pathologic process. Soft tissues and spinal canal: No prevertebral fluid or swelling. No visible canal hematoma. Disc levels: Mild cervical spondylosis with discogenic degenerative changes greatest at the C6-7 level. No significant bony foraminal or canal stenosis. Upper chest: Negative. Other: Tracheostomy tube in situ. Calcific atherosclerosis of carotid bifurcations. IMPRESSION: CT head: 1. No acute intracranial abnormality identified. 2. Mild paranasal sinus disease. CT cervical spine: 1. No acute fracture or dislocation identified. 2. Mild cervical spondylosis greatest at C6-7 level. 3. Calcific atherosclerosis of carotid bifurcations. Electronically Signed   By: Mitzi Hansen M.D.   On: 08/15/2018 18:12   Dg Chest Port 1 View  Result Date: 08/02/2018 CLINICAL DATA:  Acute onset of respiratory failure. EXAM: PORTABLE CHEST 1 VIEW COMPARISON:  Chest radiograph performed 07/29/2018 FINDINGS: The patient's tracheostomy tube is seen ending 7 cm above the carina. The lungs are mildly hypoexpanded. Vascular congestion is seen. Patchy interstitial airspace opacities may reflect interstitial edema or pneumonia, mildly worsened from the prior study. There is no evidence of pleural effusion or pneumothorax. The cardiomediastinal silhouette is borderline normal in size. No acute osseous abnormalities are seen. IMPRESSION: Lungs mildly hypoexpanded. Vascular congestion noted. Patchy interstitial airspace opacities may reflect interstitial edema or pneumonia, mildly worsened from the prior study. Electronically Signed   By: Roanna Raider M.D.   On: 08/02/2018 06:24     Assessment/Plan:   57 y.o. male with a known history of CKD stage IV, hypertension, diabetes mellitus, COPD with  tracheostomy who had AV fistula presents  to the hospital brought in by EMS after he was found unresponsive by family.  He was found to have blood glucose of 42 along with oxygen saturations in the 70s.  Unclear of how long patient was down for.  He was given dextrose and put on oxygen and brought to the emergency room.  Patient continues to be unresponsive.  He had a chest x-ray and CT scan of the head.  Chest x-ray shows pulmonary edema and bilateral infiltrates concerning for pneumonia.  CT scan of the head shows nothing acute.  Patient has blood pressure up to 200.  There was a concern for seizure activity and pt did receive benzodiazepine on route to [facility]  - Confusion/AMS is likely related to multiple metabolic problems which he is being treated for.  Has worsening renal function along with PNA.  - Brief possible seizure likely in setting of hypoglycemia on arrival which has been improved.  - initial CTh reviewed -Given found unresponsive and hypoxic there is possibility of anoxia - EEG to be ordered for tomorrow as well as CTH to look for loss of grey/white differentiation.  - No anti epileptic medications at this time.   Pauletta Browns  08/03/2018, 1:45 PM

## 2018-08-03 NOTE — Progress Notes (Signed)
Initial Nutrition Assessment  DOCUMENTATION CODES:   Obesity unspecified  INTERVENTION:  Initiate Vital High Protein at 50 mL/hr (1200 mL goal daily volume) + Pro-Stat 30 mL TID via NGT. Provides 1500 kcal, 150 grams of protein, 1008 mL H2O daily.  Provide liquid MVI daily per tube as goal TF regimen does not meet 100% RDIs for vitamins/minerals.  Continue free water flush of 20-30 mL Q4hrs to maintain tube patency.  NUTRITION DIAGNOSIS:   Inadequate oral intake related to inability to eat as evidenced by NPO status.  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   Patient on mechanical ventilation through tracheostomy tube that was present on admission. Not on any continuous sedation. On PRVC mode with FiO2 35% and PEEP 5 cmH2O. Abdomen is soft. Patient had a small type 5 BM today. Good UOP yesterday. No family members present at time of RD assessment. Limited weight history in chart to trend. Patient is currently 118.5 kg (261.25 lbs).  Enteral Access: 16 Fr. NGT placed 11/9; terminates in body of stomach per abdominal x-ray 11/9; 60 cm at left nare  MAP: 96-102 mmHg  TF: pt was started on adult TF protocol yesterday; tolerating VHP at 40 ml/hr + Pro-Stat 30 mL BID; receiving free water flush of 20 mL Q4hrs on pump  Patient is currently intubated on ventilator support Ve: 15.3 L/min Temp (24hrs), Avg:99.7 F (37.6 C), Min:99.3 F (37.4 C), Max:99.9 F (37.7 C)  Propofol: N/A  Medications reviewed and include: lisinopril, aztreonam,  famotidine, regular insulin gtt at 3.1 mL/hr, vancomycin.  Labs reviewed: CBG 134-395 past 24 hrs. Phosphorus and Magnesium WNL today. On 11/9 Potassium 3.3, BUN 48, Creatinine 3.63.  I/O: 3300 mL UOP yesterday (1.2 mL/kg/hr)  Patient does not meet criteria for malnutrition at this time.  Discussed with RN.  NUTRITION - FOCUSED PHYSICAL EXAM:    Most Recent Value  Orbital Region  No depletion  Upper Arm Region  No depletion  Thoracic and Lumbar Region  No depletion  Buccal Region  Unable to assess  Temple Region  No depletion  Clavicle Bone Region  No depletion  Clavicle and Acromion Bone Region  No depletion  Scapular Bone Region  Unable to assess  Dorsal Hand  No depletion  Patellar Region  No depletion  Anterior Thigh Region  No depletion  Posterior Calf Region  No depletion  Edema (RD Assessment)  Mild  Hair  Reviewed  Eyes  Unable to assess  Mouth  Unable to assess  Skin  Reviewed [ecchymosis]  Nails  Reviewed     Diet Order:   Diet Order            Diet NPO time specified  Diet effective now             EDUCATION NEEDS:   No education needs have been identified at this time  Skin:  Skin Assessment: Reviewed RN Assessment(ecchymosis)  Last BM:  08/02/2018 - small type 5  Height:   Ht Readings from Last 1 Encounters:  08/09/2018 5\' 8"  (1.727 m)  Weight:   Wt Readings from Last 1 Encounters:  08/02/2018 118.5 kg   Ideal Body Weight:  70 kg  BMI:  Body mass index is 39.72 kg/m.  Estimated Nutritional Needs:   Kcal:  1610-9604 (11-14 kcal/kg)  Protein:  140 grams (2 grams/kg IBW)  Fluid:  1.75-2.1 L/day (25-30 mL/kg IBW)  Helane Rima, MS, RD, LDN Office: [phone] Pager: [phone] After Hours/Weekend Pager: [phone]

## 2018-08-04 ENCOUNTER — Inpatient Hospital Stay: Payer: BLUE CROSS/BLUE SHIELD

## 2018-08-04 DIAGNOSIS — J9600 Acute respiratory failure, unspecified whether with hypoxia or hypercapnia: Secondary | ICD-10-CM

## 2018-08-04 DIAGNOSIS — I160 Hypertensive urgency: Secondary | ICD-10-CM

## 2018-08-04 DIAGNOSIS — J9602 Acute respiratory failure with hypercapnia: Secondary | ICD-10-CM

## 2018-08-04 DIAGNOSIS — R4182 Altered mental status, unspecified: Secondary | ICD-10-CM

## 2018-08-04 DIAGNOSIS — N184 Chronic kidney disease, stage 4 (severe): Secondary | ICD-10-CM

## 2018-08-04 DIAGNOSIS — G9340 Encephalopathy, unspecified: Secondary | ICD-10-CM

## 2018-08-04 LAB — MAGNESIUM: MAGNESIUM: 2 mg/dL (ref 1.7–2.4)

## 2018-08-04 LAB — RENAL FUNCTION PANEL
Albumin: 2 g/dL — ABNORMAL LOW (ref 3.5–5.0)
Anion gap: 10 (ref 5–15)
BUN: 63 mg/dL — ABNORMAL HIGH (ref 6–20)
CHLORIDE: 111 mmol/L (ref 98–111)
CO2: 24 mmol/L (ref 22–32)
CREATININE: 3.44 mg/dL — AB (ref 0.61–1.24)
Calcium: 7.9 mg/dL — ABNORMAL LOW (ref 8.9–10.3)
GFR, EST AFRICAN AMERICAN: 21 mL/min — AB (ref >60)
GFR, EST NON AFRICAN AMERICAN: 18 mL/min — AB (ref >60)
Glucose, Bld: 283 mg/dL — ABNORMAL HIGH (ref 70–99)
Phosphorus: 3.7 mg/dL (ref 2.5–4.6)
Potassium: 3.4 mmol/L — ABNORMAL LOW (ref 3.5–5.1)
Sodium: 145 mmol/L (ref 135–145)

## 2018-08-04 LAB — BRAIN NATRIURETIC PEPTIDE: B Natriuretic Peptide: 243 pg/mL — ABNORMAL HIGH (ref 0.0–100.0)

## 2018-08-04 LAB — GLUCOSE, CAPILLARY
GLUCOSE-CAPILLARY: 224 mg/dL — AB (ref 70–99)
GLUCOSE-CAPILLARY: 228 mg/dL — AB (ref 70–99)
GLUCOSE-CAPILLARY: 254 mg/dL — AB (ref 70–99)
GLUCOSE-CAPILLARY: 289 mg/dL — AB (ref 70–99)
GLUCOSE-CAPILLARY: 303 mg/dL — AB (ref 70–99)
Glucose-Capillary: 290 mg/dL — ABNORMAL HIGH (ref 70–99)

## 2018-08-04 LAB — CULTURE, RESPIRATORY W GRAM STAIN

## 2018-08-04 LAB — PROCALCITONIN: PROCALCITONIN: 0.18 ng/mL

## 2018-08-04 LAB — CULTURE, RESPIRATORY: CULTURE: NORMAL

## 2018-08-04 LAB — HIV ANTIBODY (ROUTINE TESTING W REFLEX): HIV SCREEN 4TH GENERATION: NONREACTIVE

## 2018-08-04 MED ORDER — FAMOTIDINE 20 MG PO TABS
20.0000 mg | ORAL_TABLET | Freq: Every day | ORAL | Status: DC
  Administered 2018-08-04 – 2018-08-10 (×7): 20 mg
  Filled 2018-08-04 (×7): qty 1

## 2018-08-04 MED ORDER — HYDRALAZINE HCL 50 MG PO TABS
50.0000 mg | ORAL_TABLET | Freq: Three times a day (TID) | ORAL | Status: DC
  Administered 2018-08-04 – 2018-08-05 (×4): 50 mg
  Filled 2018-08-04 (×4): qty 1

## 2018-08-04 MED ORDER — POTASSIUM CHLORIDE 20 MEQ/15ML (10%) PO SOLN
40.0000 meq | Freq: Two times a day (BID) | ORAL | Status: AC
  Administered 2018-08-04 (×2): 40 meq
  Filled 2018-08-04 (×2): qty 30

## 2018-08-04 MED ORDER — FUROSEMIDE 10 MG/ML IJ SOLN: 20.0000 mg | Freq: Every day | INTRAMUSCULAR | Status: DC

## 2018-08-04 MED ORDER — INSULIN GLARGINE 100 UNIT/ML ~~LOC~~ SOLN
20.0000 [IU] | Freq: Every day | SUBCUTANEOUS | Status: DC
  Administered 2018-08-04: 20 [IU] via SUBCUTANEOUS
  Filled 2018-08-04 (×2): qty 0.2

## 2018-08-04 MED ORDER — CARVEDILOL 25 MG PO TABS
25.0000 mg | ORAL_TABLET | Freq: Two times a day (BID) | ORAL | Status: DC
  Administered 2018-08-04 – 2018-08-06 (×4): 25 mg
  Filled 2018-08-04 (×3): qty 1

## 2018-08-04 MED ORDER — INFLUENZA VAC SPLIT QUAD 0.5 ML IM SUSY: 0.5000 mL | PREFILLED_SYRINGE | INTRAMUSCULAR | Status: DC

## 2018-08-04 MED ORDER — ACETAMINOPHEN 650 MG RE SUPP: 650.0000 mg | Freq: Four times a day (QID) | RECTAL | Status: DC | PRN

## 2018-08-04 MED ORDER — IPRATROPIUM-ALBUTEROL 0.5-2.5 (3) MG/3ML IN SOLN: 3.0000 mL | RESPIRATORY_TRACT | Status: DC | PRN

## 2018-08-04 MED ORDER — LISINOPRIL 20 MG PO TABS
20.0000 mg | ORAL_TABLET | Freq: Every day | ORAL | Status: DC
  Administered 2018-08-04: 20 mg
  Filled 2018-08-04: qty 1

## 2018-08-04 MED ORDER — ISOSORBIDE DINITRATE 30 MG PO TABS
30.0000 mg | ORAL_TABLET | Freq: Two times a day (BID) | ORAL | Status: DC
  Administered 2018-08-04 – 2018-08-10 (×14): 30 mg
  Filled 2018-08-04 (×16): qty 1

## 2018-08-04 MED ORDER — HYDRALAZINE HCL 20 MG/ML IJ SOLN
10.0000 mg | INTRAMUSCULAR | Status: DC | PRN
  Filled 2018-08-04 (×2): qty 1

## 2018-08-04 MED ORDER — ACETAMINOPHEN 325 MG PO TABS
650.0000 mg | ORAL_TABLET | Freq: Four times a day (QID) | ORAL | Status: DC | PRN
  Administered 2018-08-05 (×2): 650 mg
  Filled 2018-08-04 (×2): qty 2

## 2018-08-04 MED ORDER — INSULIN ASPART 100 UNIT/ML ~~LOC~~ SOLN
0.0000 [IU] | SUBCUTANEOUS | Status: DC
  Administered 2018-08-04: 8 [IU] via SUBCUTANEOUS
  Administered 2018-08-04 (×2): 5 [IU] via SUBCUTANEOUS
  Administered 2018-08-04: 8 [IU] via SUBCUTANEOUS
  Administered 2018-08-04: 11 [IU] via SUBCUTANEOUS
  Administered 2018-08-04: 8 [IU] via SUBCUTANEOUS
  Administered 2018-08-05: 11 [IU] via SUBCUTANEOUS
  Administered 2018-08-05: 3 [IU] via SUBCUTANEOUS
  Administered 2018-08-05: 5 [IU] via SUBCUTANEOUS
  Administered 2018-08-05 (×2): 3 [IU] via SUBCUTANEOUS
  Administered 2018-08-05: 8 [IU] via SUBCUTANEOUS
  Administered 2018-08-05: 11 [IU] via SUBCUTANEOUS
  Administered 2018-08-06 (×2): 5 [IU] via SUBCUTANEOUS
  Administered 2018-08-06: 8 [IU] via SUBCUTANEOUS
  Administered 2018-08-06: 5 [IU] via SUBCUTANEOUS
  Administered 2018-08-06: 8 [IU] via SUBCUTANEOUS
  Administered 2018-08-07: 3 [IU] via SUBCUTANEOUS
  Administered 2018-08-07: 5 [IU] via SUBCUTANEOUS
  Administered 2018-08-07 (×4): 3 [IU] via SUBCUTANEOUS
  Administered 2018-08-07: 5 [IU] via SUBCUTANEOUS
  Administered 2018-08-08 (×5): 3 [IU] via SUBCUTANEOUS
  Administered 2018-08-09: 2 [IU] via SUBCUTANEOUS
  Administered 2018-08-09 (×2): 3 [IU] via SUBCUTANEOUS
  Administered 2018-08-09: 2 [IU] via SUBCUTANEOUS
  Administered 2018-08-09 (×2): 3 [IU] via SUBCUTANEOUS
  Administered 2018-08-10 – 2018-08-11 (×3): 2 [IU] via SUBCUTANEOUS
  Filled 2018-08-04 (×39): qty 1

## 2018-08-04 MED ORDER — LEVOFLOXACIN 750 MG PO TABS
750.0000 mg | ORAL_TABLET | ORAL | Status: AC
  Administered 2018-08-04 – 2018-08-10 (×4): 750 mg
  Filled 2018-08-04 (×4): qty 1

## 2018-08-04 NOTE — Procedures (Signed)
ELECTROENCEPHALOGRAM REPORT   Patient: Matthew Mayer       Room #: [facility] EEG No. ID: [id number] Age: 57 y.o.        Sex: male Referring Physician: Sung Amabile Report Date:  08/04/2018        Interpreting Physician: Thana Farr  History: [name] is an 57 y.o. male with altered mental status  Medications:  Norvasc, Coreg, Pepcid, Apresoline, Insulin, Isordil, Levaquin, Prinivil, MVI, Nitroglycerin  Conditions of Recording:  This is a 21 channel routine scalp EEG performed with bipolar and monopolar montages arranged in accordance to the international 10/20 system of electrode placement. One channel was dedicated to EKG recording.  The patient is in the intubated ad unresponsive state.  Description:  The background activity is slow and poorly organized.  It consists of a low voltage, polymorphic delta activity that is diffusely distributed and continuous throughout the recording.  Some intermixed delta activity is noted as well.    No epileptiform activity is noted.   Hyperventilation and intermittent photic stimulation were not performed.  IMPRESSION: This is an abnormal EEG secondary to general background slowing.  This finding may be seen with a diffuse disturbance that is etiologically nonspecific, but may include a metabolic encephalopathy, among other possibilities.  No epileptiform activity was noted.     Thana Farr, MD Neurology [phone] 08/04/2018, 5:06 PM

## 2018-08-04 NOTE — Progress Notes (Signed)
Subjective: Patient remains unresponsive.  No further seizure-like activity.  Objective: Current vital signs: BP (!) 153/69   Pulse 75   Temp 99.5 F (37.5 C) (Bladder)   Resp 19   Ht 5' 8"  (1.727 m)   Wt 119.7 kg   SpO2 97%   BMI 40.12 kg/m  Vital signs in last 24 hours: Temp:  [99 F (37.2 C)-101.7 F (38.7 C)] 99.5 F (37.5 C) (11/11 1000) Pulse Rate:  [75-104] 75 (11/11 1000) Resp:  [19-30] 19 (11/11 1000) BP: (132-196)/(52-78) 153/69 (11/11 0900) SpO2:  [94 %-98 %] 97 % (11/11 1000) FiO2 (%):  [35 %] 35 % (11/11 0915) Weight:  [119.7 kg] 119.7 kg (11/11 0348)  Intake/Output from previous day: 11/10 0701 - 11/11 0700 In: 1518.5 [I.V.:196.8; NG/GT:814.2; IV Piggyback:507.5] Out: 3425 [Urine:3425] Intake/Output this shift: Total I/O In: 100 [NG/GT:100] Out: 350 [Urine:350] Nutritional status:  Diet Order    None      Neurologic Exam: Mental Status: Patient does not respond to verbal stimuli.  Mild grimace to deep sternal rub.  Does not follow commands.  No verbalizations noted.  Cranial Nerves: II: patient does not respond confrontation bilaterally, pupils right 3 mm, left 3 mm,and reactive bilaterally III,IV,VI: doll's response present bilaterally.  V,VII: corneal reflex present bilaterally  VIII: patient does not respond to verbal stimuli IX,X: gag reflex reduced, XI: trapezius strength unable to test bilaterally XII: tongue strength unable to test Motor: Extremities flaccid throughout.  No spontaneous movement noted.  No purposeful movements noted. Sensory: Does not respond to noxious stimuli in any extremity.  Lab Results: Basic Metabolic Panel: Recent Labs  Lab 08/19/2018 1730 08/02/18 0356 08/02/18 0522 08/02/18 1145 08/02/18 1728 08/02/18 2115 08/03/18 0504 08/04/18 0729  NA 139 144  --   --   --  140  --  145  K 3.3* 3.1*  --   --   --  3.3*  --  3.4*  CL 108 108  --   --   --  106  --  111  CO2 20* 23  --   --   --  23  --  24  GLUCOSE  143* 244*  --   --   --  387*  --  283*  BUN 37* 39*  --   --   --  48*  --  63*  CREATININE 3.94* 3.78*  --   --   --  3.63*  --  3.44*  CALCIUM 8.0* 7.8*  --   --   --  8.1*  --  7.9*  MG  --   --  1.4* 2.5* 2.1  --  2.2 2.0  PHOS  --   --  5.9* 5.5* 5.5*  --  3.5 3.7    Liver Function Tests: Recent Labs  Lab 08/08/2018 1730 08/02/18 0356 08/04/18 0729  AST 30 22  --   ALT 17 16  --   ALKPHOS 124 99  --   BILITOT 0.7 1.0  --   PROT 7.7 6.8  --   ALBUMIN 2.5* 2.0* 2.0*   Recent Labs  Lab 08/23/2018 1730  LIPASE 35   Recent Labs  Lab 08/16/2018 1730  AMMONIA 19    CBC: Recent Labs  Lab 08/22/2018 1730 08/02/18 0356 08/03/18 0504  WBC 18.4* 12.1* 11.4*  NEUTROABS 16.9*  --   --   HGB 9.7* 8.1* 8.7*  HCT 32.1* 26.4* 28.2*  MCV 86.5 86.3 84.4  PLT 291 233 286  Cardiac Enzymes: Recent Labs  Lab 08/21/2018 1730 08/02/18 0522  TROPONINI 0.03* 0.06*    Lipid Panel: No results for input(s): CHOL, TRIG, HDL, CHOLHDL, VLDL, LDLCALC in the last 168 hours.  CBG: Recent Labs  Lab 08/03/18 1937 08/04/18 0009 08/04/18 0348 08/04/18 0716 08/04/18 1137  GLUCAP 245* 303* 228* 254* 68*    Microbiology: Results for orders placed or performed during the hospital encounter of 08/19/2018  Urine Culture     Status: None   Collection Time: 08/04/2018  5:32 PM  Result Value Ref Range Status   Specimen Description   Final    URINE, RANDOM Performed at [facility], [address] [place], [place] [address]    Special Requests   Final    NONE Performed at [facility], [address], [place], [place] [address]    Culture   Final    NO GROWTH Performed at [facility], [place] [address], [place], [place] [address]    Report Status 08/02/2018 FINAL  Final  Blood culture (routine x 2)     Status: None (Preliminary result)   Collection Time: 08/05/2018  7:10 PM  Result Value Ref Range Status   Specimen Description BLOOD RIGHT ANTECUBITAL  Final    Special Requests   Final    BOTTLES DRAWN AEROBIC AND ANAEROBIC Blood Culture adequate volume   Culture   Final    NO GROWTH 3 DAYS Performed at [facility], [address] [place], [place] [address]    Report Status PENDING  Incomplete  Blood culture (routine x 2)     Status: None (Preliminary result)   Collection Time: 08/17/2018  7:10 PM  Result Value Ref Range Status   Specimen Description BLOOD BLOOD RIGHT FOREARM  Final   Special Requests   Final    BOTTLES DRAWN AEROBIC AND ANAEROBIC Blood Culture adequate volume   Culture   Final    NO GROWTH 3 DAYS Performed at [facility], [address] [place], [place] [address]    Report Status PENDING  Incomplete  MRSA PCR Screening     Status: None   Collection Time: 08/08/2018  9:05 PM  Result Value Ref Range Status   MRSA by PCR NEGATIVE NEGATIVE Final    Comment:        The GeneXpert MRSA Assay (FDA approved for NASAL specimens only), is one component of a comprehensive MRSA colonization surveillance program. It is not intended to diagnose MRSA infection nor to guide or monitor treatment for MRSA infections. Performed at [facility], [address] [place], [place] [address]   Culture, respiratory (non-expectorated)     Status: None   Collection Time: 08/02/18  5:04 AM  Result Value Ref Range Status   Specimen Description   Final    TRACHEAL ASPIRATE Performed at [facility], [address], [place], [place] [address]    Special Requests   Final    NONE Performed at [facility], [place], [place] [address]    Gram Stain   Final    MODERATE SQUAMOUS EPITHELIAL CELLS PRESENT ABUNDANT WBC PRESENT, PREDOMINANTLY PMN FEW GRAM POSITIVE RODS RARE GRAM POSITIVE COCCI    Culture   Final    FEW Consistent with normal respiratory flora. Performed at [facility], [place] [address], [place], [place] [address]    Report Status 08/04/2018 FINAL  Final     Coagulation Studies: No results for input(s):  LABPROT, INR in the last 72 hours.  Imaging: Ct Head Wo Contrast  Result Date: 08/04/2018 CLINICAL DATA:  Altered level of consciousness. EXAM: CT HEAD WITHOUT CONTRAST TECHNIQUE: Contiguous axial images were obtained from the base of the skull through the vertex without intravenous contrast. COMPARISON:  CT head 07/31/2018 FINDINGS: Brain: No evidence of acute infarction, hemorrhage, hydrocephalus, extra-axial collection or mass lesion/mass effect. Vascular: Negative for hyperdense vessel. Atherosclerotic calcification cavernous carotid bilaterally. Skull: Negative Sinuses/Orbits: Negative Other: None IMPRESSION: Negative CT head Electronically Signed   By: Franchot Gallo M.D.   On: 08/04/2018 11:20   Dg Chest Port 1 View  Result Date: 08/04/2018 CLINICAL DATA:  Acute respiratory failure EXAM: PORTABLE CHEST 1 VIEW COMPARISON:  08/02/2018 FINDINGS: Cardiac shadow is mildly enlarged but stable. Tracheostomy tube and nasogastric catheter are noted in satisfactory position. The lungs are hypoinflated. Some patchy infiltrates are again seen in the bases particularly on the right. No significant interstitial edema is noted. No bony abnormality is seen. IMPRESSION: Mild bibasilar changes right greater than left stable from the prior exam. Tubes and lines as described. Electronically Signed   By: Inez Catalina M.D.   On: 08/04/2018 07:10    Medications:  I have reviewed the patient's current medications. Scheduled: . amLODipine  10 mg Per NG tube Daily  . carvedilol  25 mg Per Tube BID WC  . chlorhexidine gluconate (MEDLINE KIT)  15 mL Mouth Rinse BID  . famotidine  20 mg Per Tube Daily  . feeding supplement (PRO-STAT SUGAR FREE 64)  30 mL Per Tube TID  . heparin  5,000 Units Subcutaneous Q8H  . hydrALAZINE  50 mg Per Tube Q8H  . insulin aspart  0-15 Units Subcutaneous Q4H  . insulin glargine  20 Units Subcutaneous Daily  . isosorbide dinitrate   30 mg Per Tube BID  . levofloxacin  750 mg Per Tube Q48H  . lisinopril  20 mg Per Tube Daily  . mouth rinse  15 mL Mouth Rinse 10 times per day  . multivitamin  15 mL Per Tube Daily  . potassium chloride  40 mEq Per Tube BID  . sodium chloride flush  3 mL Intravenous Q12H    Assessment/Plan: Patient remains unresponsive.  Initial head CT unremarkable.  Concern remains for some anoxic injury although some metabolic issues remain that may be contributing to mental status.  No clinical evidence of brain death.  Recommendations: 1.  EEG 2.  Repeat head CT without contrast     LOS: 3 days   [place] Goodell, MD Neurology [phone] 08/04/2018  11:47 AM

## 2018-08-04 NOTE — Progress Notes (Signed)
[place], [place] 08/04/18  Subjective:  Patient remains critically ill. Still on the ventilator. Patient still with encephalopathy.    Objective:  Vital signs in last 24 hours:  Temp:  [99 F (37.2 C)-101.7 F (38.7 C)] 99.5 F (37.5 C) (11/11 1000) Pulse Rate:  [75-104] 75 (11/11 1000) Resp:  [19-30] 19 (11/11 1000) BP: (132-196)/(52-78) 153/69 (11/11 0900) SpO2:  [93 %-98 %] 93 % (11/11 1221) FiO2 (%):  [35 %] 35 % (11/11 1221) Weight:  [119.7 kg] 119.7 kg (11/11 0348)  Weight change:  Filed Weights   08/05/2018 1728 08/17/2018 2100 08/04/18 0348  Weight: 117 kg 118.5 kg 119.7 kg    Intake/Output:    Intake/Output Summary (Last 24 hours) at 08/04/2018 1252 Last data filed at 08/04/2018 0800 Gross per 24 hour  Intake 1618.47 ml  Output 3475 ml  Net -1856.53 ml     Physical Exam: General:  Critically ill-appearing, laying in the bed  HEENT  mouth is closed  Neck  tracheostomy in place  Pulm/lungs  ventilator assisted.  Scattered rhonchi  CVS/Heart  regular, no rub  Abdomen:   Soft, obese  Extremities:  No peripheral edema  Neurologic:  Did not respond to verbal or tactile stimuli  Skin:  Warm, dry  Access:  Left upper extremity developing new  AV fistula       Basic Metabolic Panel:  Recent Labs  Lab 08/22/2018 1730 08/02/18 0356 08/02/18 0522 08/02/18 1145 08/02/18 1728 08/02/18 2115 08/03/18 0504 08/04/18 0729  NA 139 144  --   --   --  140  --  145  K 3.3* 3.1*  --   --   --  3.3*  --  3.4*  CL 108 108  --   --   --  106  --  111  CO2 20* 23  --   --   --  23  --  24  GLUCOSE 143* 244*  --   --   --  387*  --  283*  BUN 37* 39*  --   --   --  48*  --  63*  CREATININE 3.94* 3.78*  --   --   --  3.63*  --  3.44*  CALCIUM 8.0* 7.8*  --   --   --  8.1*  --  7.9*  MG  --   --  1.4* 2.5* 2.1  --  2.2 2.0  PHOS  --   --  5.9* 5.5* 5.5*  --  3.5 3.7     CBC: Recent Labs  Lab 08/19/2018 1730 08/02/18 0356  08/03/18 0504  WBC 18.4* 12.1* 11.4*  NEUTROABS 16.9*  --   --   HGB 9.7* 8.1* 8.7*  HCT 32.1* 26.4* 28.2*  MCV 86.5 86.3 84.4  PLT 291 233 286     No results found for: HEPBSAG, HEPBSAB, HEPBIGM    Microbiology:  Recent Results (from the past 240 hour(s))  Urine Culture     Status: None   Collection Time: 08/10/2018  5:32 PM  Result Value Ref Range Status   Specimen Description   Final    URINE, RANDOM Performed at [facility], [address], [place], [place] [address]    Special Requests   Final    NONE Performed at [facility] [address], [place], [place] [address]    Culture   Final    NO GROWTH Performed at [facility], [address] [place], [place] [address]    Report Status 08/02/2018 FINAL  Final  Blood culture (routine x 2)     Status: None (Preliminary result)   Collection Time: 08/19/2018  7:10 PM  Result Value Ref Range Status   Specimen Description BLOOD RIGHT ANTECUBITAL  Final   Special Requests   Final    BOTTLES DRAWN AEROBIC AND ANAEROBIC Blood Culture adequate volume   Culture   Final    NO GROWTH 3 DAYS Performed at [facility], [address], [place], [place] [address]    Report Status PENDING  Incomplete  Blood culture (routine x 2)     Status: None (Preliminary result)   Collection Time: 08/03/2018  7:10 PM  Result Value Ref Range Status   Specimen Description BLOOD BLOOD RIGHT FOREARM  Final   Special Requests   Final    BOTTLES DRAWN AEROBIC AND ANAEROBIC Blood Culture adequate volume   Culture   Final    NO GROWTH 3 DAYS Performed at [facility], [address], [place], [place] [address]    Report Status PENDING  Incomplete  MRSA PCR Screening     Status: None   Collection Time: 08/14/2018  9:05 PM  Result Value Ref Range Status   MRSA by PCR NEGATIVE NEGATIVE Final    Comment:        The GeneXpert MRSA Assay (FDA approved for NASAL specimens only), is one component of  a comprehensive MRSA colonization surveillance program. It is not intended to diagnose MRSA infection nor to guide or monitor treatment for MRSA infections. Performed at [facility], [address] [place], [place] [address]   Culture, respiratory (non-expectorated)     Status: None   Collection Time: 08/02/18  5:04 AM  Result Value Ref Range Status   Specimen Description   Final    TRACHEAL ASPIRATE Performed at [facility], [address] [place], [place] [address]    Special Requests   Final    NONE Performed at [facility], [place], [place] [address]    Gram Stain   Final    MODERATE SQUAMOUS EPITHELIAL CELLS PRESENT ABUNDANT WBC PRESENT, PREDOMINANTLY PMN FEW GRAM POSITIVE RODS RARE GRAM POSITIVE COCCI    Culture   Final    FEW Consistent with normal respiratory flora. Performed at [facility], [place] [address], [place],  [address]    Report Status 08/04/2018 FINAL  Final    Coagulation Studies: No results for input(s): LABPROT, INR in the last 72 hours.  Urinalysis: Recent Labs    08/17/2018 1731  COLORURINE YELLOW*  LABSPEC 1.010  PHURINE 5.0  GLUCOSEU >=500*  HGBUR MODERATE*  BILIRUBINUR NEGATIVE  KETONESUR 5*  PROTEINUR 100*  NITRITE NEGATIVE  LEUKOCYTESUR NEGATIVE      Imaging: Ct Head Wo Contrast  Result Date: 08/04/2018 CLINICAL DATA:  Altered level of consciousness. EXAM: CT HEAD WITHOUT CONTRAST TECHNIQUE: Contiguous axial images were obtained from the base of the skull through the vertex without intravenous contrast. COMPARISON:  CT head 08/08/2018 FINDINGS: Brain: No evidence of acute infarction, hemorrhage, hydrocephalus, extra-axial collection or mass lesion/mass effect. Vascular: Negative for hyperdense vessel. Atherosclerotic calcification cavernous carotid bilaterally. Skull: Negative Sinuses/Orbits: Negative Other: None IMPRESSION: Negative CT head Electronically Signed   By:  Franchot Gallo M.D.   On: 08/04/2018 11:20   Dg Chest Port 1 View  Result Date: 08/04/2018 CLINICAL DATA:  Acute respiratory failure EXAM: PORTABLE  CHEST 1 VIEW COMPARISON:  08/02/2018 FINDINGS: Cardiac shadow is mildly enlarged but stable. Tracheostomy tube and nasogastric catheter are noted in satisfactory position. The lungs are hypoinflated. Some patchy infiltrates are again seen in the bases particularly on the right. No significant interstitial edema is noted. No bony abnormality is seen. IMPRESSION: Mild bibasilar changes right greater than left stable from the prior exam. Tubes and lines as described. Electronically Signed   By: Inez Catalina M.D.   On: 08/04/2018 07:10     Medications:   . feeding supplement (VITAL HIGH PROTEIN) 1,000 mL (08/03/18 1143)  . nitroGLYCERIN 25 mcg/min (08/04/18 1155)   . amLODipine  10 mg Per NG tube Daily  . carvedilol  25 mg Per Tube BID WC  . chlorhexidine gluconate (MEDLINE KIT)  15 mL Mouth Rinse BID  . famotidine  20 mg Per Tube Daily  . feeding supplement (PRO-STAT SUGAR FREE 64)  30 mL Per Tube TID  . heparin  5,000 Units Subcutaneous Q8H  . hydrALAZINE  50 mg Per Tube Q8H  . insulin aspart  0-15 Units Subcutaneous Q4H  . insulin glargine  20 Units Subcutaneous Daily  . isosorbide dinitrate  30 mg Per Tube BID  . levofloxacin  750 mg Per Tube Q48H  . lisinopril  20 mg Per Tube Daily  . mouth rinse  15 mL Mouth Rinse 10 times per day  . multivitamin  15 mL Per Tube Daily  . potassium chloride  40 mEq Per Tube BID  . sodium chloride flush  3 mL Intravenous Q12H   acetaminophen **OR** acetaminophen, bisacodyl, fentaNYL (SUBLIMAZE) injection, hydrALAZINE, ipratropium-albuterol, [DISCONTINUED] ondansetron **OR** ondansetron (ZOFRAN) IV, sennosides  Assessment/ Plan:  57 y.o. 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  Acute on chronic kidney disease stage IV Baseline creatinine 3.13, GFR 21 from July 07, 2018 Creatinine has now 3.4 UOP >3300 cc (foley) -  Renal function has improved slightly.  Creatinine down to 3.4.  Urine output was 3.4 L over the preceding 24 hours.  No acute indication for dialysis.  Continue to monitor renal parameters closely.  2.  Altered mental status Differential includes hypoglycemia, hypoxia, seizure Urine drug screen is positive for benzodiazepines neurology evaluation is ongoing Doubt uremia.  3.  Diabetes type 2, insulin-dependent Poorly controlled Hemoglobin A1c 9.2% on 08/02/2018  4. Acute respiratory failure.  Continue ventilatory support at this time.   LOS: 3 Juna Caban 11/11/201912:52 PM  [place], [place]  Note: This note was prepared with Dragon dictation. Any transcription errors are unintentional

## 2018-08-04 NOTE — Progress Notes (Signed)
[facility] [place] [facility]   PATIENT NAME: Braian Tijerina    MR#:  [id number]  DATE OF BIRTH:  May 12, 1961  SUBJECTIVE:   Remains unresponsive. No response to verbal stimuli painful stimuli. REVIEW OF SYSTEMS:   Review of Systems  Unable to perform ROS: Intubated   DRUG ALLERGIES:   Allergies  Allergen Reactions  . Augmentin [Amoxicillin-Pot Clavulanate] Anaphylaxis  . Penicillins Anaphylaxis    Has patient had a PCN reaction causing immediate rash, facial/tongue/throat swelling, SOB or lightheadedness with hypotension: Yes Has patient had a PCN reaction causing severe rash involving mucus membranes or skin necrosis: No Has patient had a PCN reaction that required hospitalization: Yes Has patient had a PCN reaction occurring within the last 10 years: Yes If all of the above answers are "NO", then may proceed with Cephalosporin use.   . Codeine Itching  . Biaxin [Clarithromycin] Rash    VITALS:  Blood pressure (!) 181/71, pulse 73, temperature 98.6 F (37 C), resp. rate 20, height 5\' 8"  (1.727 m), weight 119.7 kg, SpO2 96 %.  PHYSICAL EXAMINATION:   Physical Exam  GENERAL:  57 y.o.-year-old patient lying in the bed with no acute distress. Critically and chronically ill EYES: Pupils equal, round, reactive to light and accommodation. No scleral icterus. Extraocular muscles intact.  HEENT: Head atraumatic, normocephalic. Oropharynx and nasopharynx clear. Tracheostomy++ NECK:  Supple, no jugular venous distention. No thyroid enlargementTrach ++ LUNGS: Normal breath sounds bilaterally, no wheezing, rales, rhonchi. No use of accessory muscles of respiration.  CARDIOVASCULAR: S1, S2 normal. No murmurs, rubs, or gallops.  ABDOMEN: Soft, nontender, nondistended. Bowel sounds present. No organomegaly or mass.  EXTREMITIES: No cyanosis, clubbing or edema b/l.    NEUROLOGIC: intubated  PSYCHIATRIC:  Intubated  SKIN: No obvious rash, lesion, or  ulcer.   LABORATORY PANEL:  CBC Recent Labs  Lab 08/03/18 0504  WBC 11.4*  HGB 8.7*  HCT 28.2*  PLT 286    Chemistries  Recent Labs  Lab 08/02/18 0356  08/04/18 0729  NA 144   < > 145  K 3.1*   < > 3.4*  CL 108   < > 111  CO2 23   < > 24  GLUCOSE 244*   < > 283*  BUN 39*   < > 63*  CREATININE 3.78*   < > 3.44*  CALCIUM 7.8*   < > 7.9*  MG  --    < > 2.0  AST 22  --   --   ALT 16  --   --   ALKPHOS 99  --   --   BILITOT 1.0  --   --    < > = values in this interval not displayed.   Cardiac Enzymes Recent Labs  Lab 08/02/18 0522  TROPONINI 0.06*   RADIOLOGY:  Ct Head Wo Contrast  Result Date: 08/04/2018 CLINICAL DATA:  Altered level of consciousness. EXAM: CT HEAD WITHOUT CONTRAST TECHNIQUE: Contiguous axial images were obtained from the base of the skull through the vertex without intravenous contrast. COMPARISON:  CT head 08/04/2018 FINDINGS: Brain: No evidence of acute infarction, hemorrhage, hydrocephalus, extra-axial collection or mass lesion/mass effect. Vascular: Negative for hyperdense vessel. Atherosclerotic calcification cavernous carotid bilaterally. Skull: Negative Sinuses/Orbits: Negative Other: None IMPRESSION: Negative CT head Electronically Signed   By: Marlan Palau M.D.   On: 08/04/2018 11:20   Dg Chest Port 1 View  Result Date: 08/04/2018 CLINICAL DATA:  Acute respiratory failure EXAM: PORTABLE CHEST  1 VIEW COMPARISON:  08/02/2018 FINDINGS: Cardiac shadow is mildly enlarged but stable. Tracheostomy tube and nasogastric catheter are noted in satisfactory position. The lungs are hypoinflated. Some patchy infiltrates are again seen in the bases particularly on the right. No significant interstitial edema is noted. No bony abnormality is seen. IMPRESSION: Mild bibasilar changes right greater than left stable from the prior exam. Tubes and lines as described. Electronically Signed   By: Alcide Clever M.D.   On: 08/04/2018 07:10   ASSESSMENT AND PLAN:   Doran Nestle  is a 57 y.o. male with a known history of CKD stage IV, hypertension, diabetes mellitus, COPD with tracheostomy who had AV fistula surgery 2 days back presents to the hospital brought in by EMS after he was found unresponsive by family.  He was found to have blood glucose of 42 along with oxygen saturations in the 70s.  Unclear of how long patient was down for.  He was given dextrose and put on oxygen and brought to the emergency room.   *Severe sepsis with bilateral pneumonia and acute on chronic hypoxic respiratory failure with acute encephalopathy Patient is critically ill.  -  Patient no on full ventilatory support via his tracheostomy since he was using accessory muscles and is unresponsive to respiratory failure. -No IV fluids due to pulmonary edema. --Broad-spectrum with Levaquin  *Acute encephalopathy metabolic versus anoxic -seen by neurology. -EEG results pending -repeat CT had negative  *CKD stage IV.   - Consult nephrology if dialysis is needed. -Patient had fistula placement in preparation for hemodialysis two days ago. -Received nephrology input   *Hypoglycemia with diabetes mellitus.  - resolved  *Hypertension.  IV hydralazine rn  *DVT prophylaxis with heparin  Case discussed with Care Management/Social Worker. Management plans discussed with the patient, family and they are in agreement.  CODE STATUS:full  DVT Prophylaxis: heparin  TOTAL TIME TAKING CARE OF THIS PATIENT: *30* minutes.  >50% time spent on counselling and coordination of care  POSSIBLE D/C IN few* DAYS, DEPENDING ON CLINICAL CONDITION.  Note: This dictation was prepared with Dragon dictation along with smaller phrase technology. Any transcriptional errors that result from this process are unintentional.  Enedina Finner M.D on 08/04/2018 at 3:42 PM  Between 7am to 6pm - Pager - [phone]  After 6pm go to [email or web address] - Social research officer, government  [facility]   Office  [phone]  CC: Primary care physician; Marina Goodell, MDPatient ID: [name], male   DOB: [birth year], 57 y.o.   MRN: [id number]

## 2018-08-04 NOTE — Progress Notes (Signed)
Pt profile: 31 M with CKD, Htn, DM2, chronic trach tube adm 11/08 (2 days after LUE AVF placed) after being found unresponsive by family. Was initially hypoglycemic with seizure-like activity and posturing documented.  Did not improve with administration of dextrose.  CXR on admission worrisome for pneumonia versus pulmonary edema.  Tracheostomy tube changed out and mechanical ventilation initiated on day of admission.   Lines, Tubes, etc: Chronic trach tube  Microbiology: MRSA PCR 11/08 >> NEG Urine 11/08 >> NEG Resp 11/09 >> NOF  Blood 11/08 >>    Antibiotics:  Anti-infectives (From admission, onward)   Start     Dose/Rate Route Frequency Ordered Stop   08/04/18 1015  levofloxacin (LEVAQUIN) tablet 750 mg     750 mg Per Tube Every 48 hours 08/04/18 1012     08/04/18 0321  vancomycin (VANCOCIN) 1,500 mg in sodium chloride 0.9 % 500 mL IVPB  Status:  Discontinued     1,500 mg 250 mL/hr over 120 Minutes Intravenous Every 48 hours 08/02/18 1415 08/04/18 0849   08/02/18 0600  aztreonam (AZACTAM) injection 1 g  Status:  Discontinued     1 g Intramuscular Every 8 hours 08/23/2018 2011 08/02/18 0448   08/02/18 0600  aztreonam (AZACTAM) 1 g in sodium chloride 0.9 % 100 mL IVPB  Status:  Discontinued     1 g 200 mL/hr over 30 Minutes Intravenous Every 8 hours 08/02/18 0448 08/04/18 1012   08/02/18 0400  vancomycin (VANCOCIN) 1,500 mg in sodium chloride 0.9 % 500 mL IVPB  Status:  Discontinued     1,500 mg 250 mL/hr over 120 Minutes Intravenous Every 24 hours 08/10/2018 2011 08/02/18 1415   07/28/2018 1815  aztreonam (AZACTAM) 2 g in sodium chloride 0.9 % 100 mL IVPB     2 g 200 mL/hr over 30 Minutes Intravenous  Once 08/06/2018 1801 08/14/2018 1946   07/30/2018 1815  metroNIDAZOLE (FLAGYL) IVPB 500 mg  Status:  Discontinued     500 mg 100 mL/hr over 60 Minutes Intravenous Every 8 hours 08/12/2018 1801 08/02/18 1502   07/27/2018 1815  vancomycin (VANCOCIN) IVPB 1000 mg/200 mL premix     1,000 mg 200  mL/hr over 60 Minutes Intravenous  Once 08/16/2018 1801 07/30/2018 2120       Studies/Events: 11/08 CT head: No acute findings 11/09 EEG: 11/11 CT head: No acute findings 7/11 EEG:  Consults:  11/08 ENT 11/09 Neurology   Best Practice: DVT: SQ heparin SUP: enteral famotidine Nutrition: TF protocol (11/10) Glycemic control: SSI protocol (11/11) Sedation/analgesia: none required  Subj: RASS -4, -5.  Continuous movement.  No withdrawal.  Obj: Vitals:   08/04/18 0915 08/04/18 1000  BP:    Pulse:  75  Resp:  19  Temp:  99.5 F (37.5 C)  SpO2: 94% 97%   Gen: Minimally responsive, on mechanical ventilation via tracheostomy tube HEENT: NCAT, sclerae white Neck: Trach site clean, JVP not visualized Chest: No wheezes, scattered rhonchi Cardiac: Regular, no M Abd: Obese, soft, no palpable masses, diminished BS Ext: Warm, symmetric edema Neuro: Grimaces to deep sternal rub.  Not following commands.  No spontaneous movement.  No withdrawal  BMP Latest Ref Rng & Units 08/04/2018 08/02/2018 08/02/2018  Glucose 70 - 99 mg/dL 098(J) 191(Y) 782(N)  BUN 6 - 20 mg/dL 56(O) 13(Y) 86(V)  Creatinine 0.61 - 1.24 mg/dL 7.84(O) 9.62(X) 5.28(U)  BUN/Creat Ratio 9 - 20 - - -  Sodium 135 - 145 mmol/L 145 140 144  Potassium 3.5 - 5.1  mmol/L 3.4(L) 3.3(L) 3.1(L)  Chloride 98 - 111 mmol/L 111 106 108  CO2 22 - 32 mmol/L 24 23 23   Calcium 8.9 - 10.3 mg/dL 7.9(L) 8.1(L) 7.8(L)      CBC Latest Ref Rng & Units 08/03/2018 08/02/2018 08/08/2018  WBC 4.0 - 10.5 K/uL 11.4(H) 12.1(H) 18.4(H)  Hemoglobin 13.0 - 17.0 g/dL 1.6(X) 8.1(L) 9.7(L)  Hematocrit 39.0 - 52.0 % 28.2(L) 26.4(L) 32.1(L)  Platelets 150 - 400 K/uL 286 233 291      CXR: Cardiomegaly, RLL atelectasis versus infiltrate   IMPRESSION: 1) chronic tracheostomy tube 2) history of COPD 3) history of obstructive sleep apnea 4) history of hypertension 5) CKD, nonoliguric.  Creatinine approximately at baseline 6) acute on chronic  hypoxemic respiratory failure 7) suspected pneumonia 8) pulmonary edema 9) hypertensive emergency, now controlled 10) hypoglycemia, resolved 11) prolonged encephalopathy/obtundation  PLAN/RECS:   Cont full vent support - settings reviewed and/or adjusted Cont vent bundle Daily SBT if/when meets criteria  Continue current antihypertensive medications Transition from nitroglycerin infusion to enteral nitroglycerin (Isordil) Continue hydralazine as needed to maintain SBP <160 mmHg Monitor BMET intermittently Monitor I/Os Correct electrolytes as indicated  Nephrology consultation Continue TF protocol Monitor temp, WBC count Micro and abx as above  Repeat EEG, CT head today per neurology Palliative care consultation requested to begin to address goals of care  CCM time: 45 mins The above time includes time spent in consultation with patient and/or family members and reviewing care plan on multidisciplinary rounds  Billy Fischer, MD PCCM service Mobile [phone] Pager [phone] 08/04/2018 12:02 PM

## 2018-08-04 NOTE — Progress Notes (Signed)
eeg completed ° °

## 2018-08-04 NOTE — Consult Note (Signed)
Pharmacy Antibiotic Note  Matthew Mayer is a 57 y.o. male admitted on 19-Aug-2018 with sepsis secondary to bilateral pneumonia.  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 He recently had the AV fistula placed for HD by Dr Gilda Crease. There is a possibility that nephrology will begin hemodialysis, in which case we will adjust therapy. Pharmacy has been consulted for levofloxacin dosing.   Plan: Vancomycin was discontinued this morning due to the negative MRSA PCR result on 11/8.   Per discussion with CCM during morning rounds, aztreonam was switched to levofloxacin. Levofloxacin 750mg  IV q48h was started today based on patient's CrCl ~30 mL/min.  Height: 5\' 8"  (172.7 cm) Weight: 263 lb 14.3 oz (119.7 kg) IBW/kg (Calculated) : 68.4  Temp (24hrs), Avg:100.6 F (38.1 C), Min:99 F (37.2 C), Max:101.7 F (38.7 C)  Recent Labs  Lab 2018-08-19 1730 08-19-18 1911 08/02/18 0356 08/02/18 0522 08/02/18 2115 08/03/18 0504 08/04/18 0729  WBC 18.4*  --  12.1*  --   --  11.4*  --   CREATININE 3.94*  --  3.78*  --  3.63*  --  3.44*  LATICACIDVEN 0.9 0.7  --  1.3  --   --   --     Estimated Creatinine Clearance: 29.8 mL/min (A) (by C-G formula based on SCr of 3.44 mg/dL (H)).    Allergies  Allergen Reactions  . Augmentin [Amoxicillin-Pot Clavulanate] Anaphylaxis  . Penicillins Anaphylaxis    Has patient had a PCN reaction causing immediate rash, facial/tongue/throat swelling, SOB or lightheadedness with hypotension: Yes Has patient had a PCN reaction causing severe rash involving mucus membranes or skin necrosis: No Has patient had a PCN reaction that required hospitalization: Yes Has patient had a PCN reaction occurring within the last 10 years: Yes If all of the above answers are "NO", then may proceed with Cephalosporin use.   . Codeine Itching  . Biaxin [Clarithromycin] Rash    Antimicrobials this admission: vancomycin 11/8 >> 11/11 Flagyl 11/8 >> 11/9 Aztreonam 11/8 >>  11/11 Levofloxacin 11/11 >>   Microbiology results: 11/9 Resp Cx: normal respiratory flora  11/8 BCx: NG x3 days  11/8 UCx: negative   11/8 MRSA PCR: negative   Thank you for allowing pharmacy to be a part of this patient's care.  Broadus John, PharmD candidate  08/04/2018 11:12 AM

## 2018-08-04 NOTE — Progress Notes (Signed)
Inpatient Diabetes Program Recommendations  AACE/ADA: New Consensus Statement on Inpatient Glycemic Control (2019)  Target Ranges:  Prepandial:   less than 140 mg/dL      Peak postprandial:   less than 180 mg/dL (1-2 hours)      Critically ill patients:  140 - 180 mg/dL   Results for Matthew Mayer, Matthew Mayer (MRN [id number]) as of 08/04/2018 11:24  Ref. Range 08/04/2018 00:09 08/04/2018 03:48 08/04/2018 07:16  Glucose-Capillary Latest Ref Range: 70 - 99 mg/dL 409 (H) 811 (H) 914 (H)   Results for Matthew Mayer, Matthew Mayer (MRN [id number]) as of 08/04/2018 11:24  Ref. Range 08/03/2018 05:47 08/03/2018 07:17 08/03/2018 09:32 08/03/2018 10:18 08/03/2018 11:35 08/03/2018 13:04 08/03/2018 14:33 08/03/2018 16:05 08/03/2018 19:37  Glucose-Capillary Latest Ref Range: 70 - 99 mg/dL 086 (H) 578 (H) 469 (H) 161 (H) 148 (H) 151 (H) 145 (H) 172 (H) 245 (H)   Results for Matthew Mayer, Matthew Mayer (MRN [id number]) as of 08/04/2018 11:24  Ref. Range 08/02/2018 05:22  Hemoglobin A1C Latest Ref Range: 4.8 - 5.6 % 9.2 (H)   Review of Glycemic Control  Diabetes history: DM2 Outpatient Diabetes medications: Humulin R U500 125 units with meals Current orders for Inpatient glycemic control: Lantus 20 units daily, Novolog 0-15 units Q4H  Inpatient Diabetes Program Recommendations:  Insulin - Basal: Please consider increasing Lantus to 20 units BID (starting today). Correction (SSI): Please consider using ICU Glycemic Control order set.  Insulin - Tube Feeding Coverage: Please consider ordering Novolog 4 units Q4H for tube feeding coverage. If tube feeding is stopped or held then Novolog tube feeding coverage should also be stopped or held.  HgbA1C: A1C 9.2% on 08/02/18 indicating an average glucose of 217 mg/dl over the past 2-3 months. Per chart reivew, patient is followed by [facility] for DM control. Last seen Mollie Germany, NP on 06/24/2018.   NOTE: Per Care Everywhere office note by Mollie Germany, NP on 06/24/18, "Will continue  Humulin R U500 insulin twice daily. Increase from 25 units per meal to 30 units in U100 syringe (150 units). If blood sugars remain elevated, increase to 35 units in U100 syringe (175 units)."   Last A1C 9.5% on 06/24/18. Noted patient was noted to be initially hypoglycemic at home per EMS and was started on IV insulin drip on 08/02/18. Patient was transitioned from IV to SQ insulin on 08/03/18 and received Lantus 20 units at 13:05 on 08/03/18. Glucose since midnight has ranged from 228-303 mg/dl.   Thanks, Orlando Penner, RN, MSN, CDE Diabetes Coordinator Inpatient Diabetes Program [phone] (Team Pager from 8am to 5pm)

## 2018-08-04 NOTE — Care Management (Signed)
Amanda with [employer] updated on patient status.  

## 2018-08-05 ENCOUNTER — Inpatient Hospital Stay: Payer: BLUE CROSS/BLUE SHIELD

## 2018-08-05 ENCOUNTER — Inpatient Hospital Stay
Admit: 2018-08-05 | Discharge: 2018-08-05 | Disposition: A | Discharge status: Home / Self Care | Payer: BLUE CROSS/BLUE SHIELD | Attending: Pulmonary Disease | Admitting: Pulmonary Disease

## 2018-08-05 DIAGNOSIS — E162 Hypoglycemia, unspecified: Secondary | ICD-10-CM

## 2018-08-05 DIAGNOSIS — Z515 Encounter for palliative care: Secondary | ICD-10-CM

## 2018-08-05 DIAGNOSIS — Z7189 Other specified counseling: Secondary | ICD-10-CM

## 2018-08-05 DIAGNOSIS — J9600 Acute respiratory failure, unspecified whether with hypoxia or hypercapnia: Secondary | ICD-10-CM

## 2018-08-05 DIAGNOSIS — J9621 Acute and chronic respiratory failure with hypoxia: Secondary | ICD-10-CM

## 2018-08-05 LAB — GLUCOSE, CAPILLARY
GLUCOSE-CAPILLARY: 188 mg/dL — AB (ref 70–99)
GLUCOSE-CAPILLARY: 171 mg/dL — AB (ref 70–99)
GLUCOSE-CAPILLARY: 241 mg/dL — AB (ref 70–99)
Glucose-Capillary: 197 mg/dL — ABNORMAL HIGH (ref 70–99)
Glucose-Capillary: 280 mg/dL — ABNORMAL HIGH (ref 70–99)
Glucose-Capillary: 304 mg/dL — ABNORMAL HIGH (ref 70–99)
Glucose-Capillary: 308 mg/dL — ABNORMAL HIGH (ref 70–99)

## 2018-08-05 LAB — BLOOD GAS, ARTERIAL
ACID-BASE EXCESS: 1.9 mmol/L (ref 0.0–2.0)
Acid-Base Excess: 2.3 mmol/L — ABNORMAL HIGH (ref 0.0–2.0)
BICARBONATE: 26.4 mmol/L (ref 20.0–28.0)
Bicarbonate: 25.7 mmol/L (ref 20.0–28.0)
FIO2: 0.35
FIO2: 0.4
O2 SAT: 90.2 %
O2 Saturation: 97.2 %
PCO2 ART: 38 mmHg (ref 32.0–48.0)
PEEP: 5 cmH2O
PH ART: 7.45 (ref 7.350–7.450)
PO2 ART: 89 mmHg (ref 83.0–108.0)
PRESSURE SUPPORT: 8 cmH2O
Patient temperature: 37
Patient temperature: 37
pCO2 arterial: 37 mmHg (ref 32.0–48.0)
pH, Arterial: 7.45 (ref 7.350–7.450)
pO2, Arterial: 56 mmHg — ABNORMAL LOW (ref 83.0–108.0)

## 2018-08-05 LAB — CBC
HEMATOCRIT: 31 % — AB (ref 39.0–52.0)
HEMOGLOBIN: 9.2 g/dL — AB (ref 13.0–17.0)
MCH: 26.5 pg (ref 26.0–34.0)
MCHC: 29.7 g/dL — ABNORMAL LOW (ref 30.0–36.0)
MCV: 89.3 fL (ref 80.0–100.0)
Platelets: 267 10*3/uL (ref 150–400)
RBC: 3.47 MIL/uL — AB (ref 4.22–5.81)
RDW: 16.8 % — ABNORMAL HIGH (ref 11.5–15.5)
WBC: 8.3 10*3/uL (ref 4.0–10.5)
nRBC: 0 % (ref 0.0–0.2)

## 2018-08-05 LAB — COMPREHENSIVE METABOLIC PANEL
ALT: 16 U/L (ref 0–44)
AST: 39 U/L (ref 15–41)
Albumin: 2.1 g/dL — ABNORMAL LOW (ref 3.5–5.0)
Alkaline Phosphatase: 121 U/L (ref 38–126)
Anion gap: 7 (ref 5–15)
BUN: 78 mg/dL — AB (ref 6–20)
CO2: 26 mmol/L (ref 22–32)
CREATININE: 3.3 mg/dL — AB (ref 0.61–1.24)
Calcium: 8.3 mg/dL — ABNORMAL LOW (ref 8.9–10.3)
Chloride: 116 mmol/L — ABNORMAL HIGH (ref 98–111)
GFR calc Af Amer: 22 mL/min — ABNORMAL LOW (ref >60)
GFR, EST NON AFRICAN AMERICAN: 19 mL/min — AB (ref >60)
Glucose, Bld: 306 mg/dL — ABNORMAL HIGH (ref 70–99)
POTASSIUM: 3.9 mmol/L (ref 3.5–5.1)
Sodium: 149 mmol/L — ABNORMAL HIGH (ref 135–145)
TOTAL PROTEIN: 6.9 g/dL (ref 6.5–8.1)
Total Bilirubin: 0.7 mg/dL (ref 0.3–1.2)

## 2018-08-05 MED ORDER — INSULIN ASPART 100 UNIT/ML ~~LOC~~ SOLN
4.0000 [IU] | SUBCUTANEOUS | Status: DC
  Administered 2018-08-05 – 2018-08-11 (×37): 4 [IU] via SUBCUTANEOUS
  Filled 2018-08-05 (×36): qty 1

## 2018-08-05 MED ORDER — INSULIN GLARGINE 100 UNIT/ML ~~LOC~~ SOLN
22.0000 [IU] | Freq: Two times a day (BID) | SUBCUTANEOUS | Status: DC
  Administered 2018-08-05 – 2018-08-06 (×3): 22 [IU] via SUBCUTANEOUS
  Filled 2018-08-05 (×4): qty 0.22

## 2018-08-05 MED ORDER — INSULIN GLARGINE 100 UNIT/ML ~~LOC~~ SOLN
20.0000 [IU] | Freq: Two times a day (BID) | SUBCUTANEOUS | Status: DC
  Filled 2018-08-05 (×2): qty 0.2

## 2018-08-05 MED ORDER — LISINOPRIL 5 MG PO TABS
30.0000 mg | ORAL_TABLET | Freq: Every day | ORAL | Status: DC
  Administered 2018-08-05 – 2018-08-06 (×2): 30 mg
  Filled 2018-08-05 (×2): qty 1

## 2018-08-05 MED ORDER — DEXTROSE 5 % IV SOLN: INTRAVENOUS | Status: DC

## 2018-08-05 MED ORDER — FREE WATER
100.0000 mL | Status: DC
  Administered 2018-08-05 – 2018-08-06 (×6): 100 mL

## 2018-08-05 MED ORDER — HYDRALAZINE HCL 50 MG PO TABS
75.0000 mg | ORAL_TABLET | Freq: Three times a day (TID) | ORAL | Status: DC
  Administered 2018-08-05 – 2018-08-10 (×16): 75 mg
  Filled 2018-08-05 (×16): qty 2

## 2018-08-05 MED ORDER — FUROSEMIDE 10 MG/ML IJ SOLN
40.0000 mg | Freq: Once | INTRAMUSCULAR | Status: AC
  Administered 2018-08-05: 40 mg via INTRAVENOUS
  Filled 2018-08-05: qty 4

## 2018-08-05 NOTE — Plan of Care (Signed)
Placed 35% Fio2 trach collar  now

## 2018-08-05 NOTE — Progress Notes (Signed)
Pt profile: 8 M with CKD, Htn, DM2, chronic trach tube adm 11/08 (2 days after LUE AVF placed) after being found unresponsive by family. Was initially hypoglycemic with seizure-like activity and posturing documented.  Did not improve with administration of dextrose.  CXR on admission worrisome for pneumonia versus pulmonary edema.  Tracheostomy tube changed out and mechanical ventilation initiated on day of admission.   Lines, Tubes, etc: Chronic trach tube  Microbiology: MRSA PCR 11/08 >> NEG Urine 11/08 >> NEG Resp 11/09 >> NOF  Blood 11/08 >>    Antibiotics:  Anti-infectives (From admission, onward)   Start     Dose/Rate Route Frequency Ordered Stop   08/04/18 1015  levofloxacin (LEVAQUIN) tablet 750 mg     750 mg Per Tube Every 48 hours 08/04/18 1012     08/04/18 0321  vancomycin (VANCOCIN) 1,500 mg in sodium chloride 0.9 % 500 mL IVPB  Status:  Discontinued     1,500 mg 250 mL/hr over 120 Minutes Intravenous Every 48 hours 08/02/18 1415 08/04/18 0849   08/02/18 0600  aztreonam (AZACTAM) injection 1 g  Status:  Discontinued     1 g Intramuscular Every 8 hours 07-Aug-2018 2011 08/02/18 0448   08/02/18 0600  aztreonam (AZACTAM) 1 g in sodium chloride 0.9 % 100 mL IVPB  Status:  Discontinued     1 g 200 mL/hr over 30 Minutes Intravenous Every 8 hours 08/02/18 0448 08/04/18 1012   08/02/18 0400  vancomycin (VANCOCIN) 1,500 mg in sodium chloride 0.9 % 500 mL IVPB  Status:  Discontinued     1,500 mg 250 mL/hr over 120 Minutes Intravenous Every 24 hours 08/07/2018 2011 08/02/18 1415   08-07-18 1815  aztreonam (AZACTAM) 2 g in sodium chloride 0.9 % 100 mL IVPB     2 g 200 mL/hr over 30 Minutes Intravenous  Once 08-07-2018 1801 08/07/18 1946   07-Aug-2018 1815  metroNIDAZOLE (FLAGYL) IVPB 500 mg  Status:  Discontinued     500 mg 100 mL/hr over 60 Minutes Intravenous Every 8 hours August 07, 2018 1801 08/02/18 1502   08/07/2018 1815  vancomycin (VANCOCIN) IVPB 1000 mg/200 mL premix     1,000 mg 200  mL/hr over 60 Minutes Intravenous  Once 08/07/2018 1801 08-07-2018 2120       Studies/Events: 11/08 CT head: No acute findings 11/09 EEG: 11/11 CT head: No acute findings 11/11 EEG:  Consults:  11/08 ENT 11/09 Neurology   Best Practice: DVT: SQ heparin SUP: enteral famotidine Nutrition: TF protocol (11/10) Glycemic control: SSI protocol (11/11) Sedation/analgesia: none required  Subj: RASS -4, -5.  Minimal movement of left lower extremity, currently placed on trach collar doing well, mental status is not much improved -Blood pressure still elevated, hydralazine increase lisinopril increased - Blood sugar is mildly elevated Lantus increased -EEG showed metabolic encephalopathy  Obj: Vitals:   08/05/18 0800 08/05/18 0806  BP: (!) 166/60   Pulse: 83   Resp: 19   Temp: 99.3 F (37.4 C)   SpO2: 98% 97%   Gen: Minimally responsive, on mechanical ventilation via tracheostomy tube HEENT: NCAT, sclerae white Neck: Trach site clean, JVP not visualized Chest: No wheezes, scattered rhonchi Cardiac: Regular, no M Abd: Obese, soft, no palpable masses, diminished BS Ext: Warm, symmetric edema Neuro: Grimaces to deep sternal rub.  Not following commands.  No spontaneous movement.  No withdrawal  BMP Latest Ref Rng & Units 08/05/2018 08/04/2018 08/02/2018  Glucose 70 - 99 mg/dL 956(L) 875(I) 433(I)  BUN 6 - 20 mg/dL  78(H) 63(H) 48(H)  Creatinine 0.61 - 1.24 mg/dL 4.09(W) 1.19(J) 4.78(G)  BUN/Creat Ratio 9 - 20 - - -  Sodium 135 - 145 mmol/L 149(H) 145 140  Potassium 3.5 - 5.1 mmol/L 3.9 3.4(L) 3.3(L)  Chloride 98 - 111 mmol/L 116(H) 111 106  CO2 22 - 32 mmol/L 26 24 23   Calcium 8.9 - 10.3 mg/dL 8.3(L) 7.9(L) 8.1(L)      CBC Latest Ref Rng & Units 08/05/2018 08/03/2018 08/02/2018  WBC 4.0 - 10.5 K/uL 8.3 11.4(H) 12.1(H)  Hemoglobin 13.0 - 17.0 g/dL 9.5(A) 2.1(H) 8.1(L)  Hematocrit 39.0 - 52.0 % 31.0(L) 28.2(L) 26.4(L)  Platelets 150 - 400 K/uL 267 286 233      CXR:  Cardiomegaly, RLL atelectasis versus infiltrate   IMPRESSION: 1) chronic tracheostomy tube 2) history of COPD 3) history of obstructive sleep apnea 4) history of hypertension 5) CKD, nonoliguric.  Creatinine approximately at baseline 6) acute on chronic hypoxemic respiratory failure 7) suspected pneumonia 8) pulmonary edema 9) hypertensive emergency, now controlled 10) hypoglycemia, resolved 11) prolonged encephalopathy/obtundation PLAN:  CVS: Blood pressure still elevated, increase hydralazine to 75 every 8, increase lisinopril to 30 daily, continue Norvasc 10, continue Coreg 25, get echocardiogram to evaluate for EF and wall motion last echo in 2014 showed EF of 55 to 60% RS: Doing well on pressure support with PEEP, chronic trach, will plan to place on trach collar get ABG and evaluate further -- Aspiration precaution -- Vent protocol on vented patient  ID: Was treated with Vanco plus Azactam which was switched to Levaquin on 08/04/2018, all the cultures are negative however patient had a fever spike yesterday -- Follow culture and adjust ENDO: Blood sugars are slightly elevated will adjust Lantus dose -- SSI monitor blood sugar GI: Tube feeding as tolerated currently on Protonix RENAL: Renal consult appreciated currently not started on hemodialysis BUN is 78 creatinine is 3.30 with potassium 3.9, currently not on any fluids, keeping negative balance -- Electrolyte replacement protocol, Monitor Cr and K  CNS: EEG reviewed, shows diffuse metabolic encephalopathy, not responding much unsure whether this is a baseline, does have a trach for respiratory toileting so we can consider doing trach collar and evaluate further, follow neurology lead for any further work-up, and ammonia was checked in the past was okay, head CT was okay HEMATOLOGY: No acute issue -- Monitor HB and PLT MUSCULOSKELETAL: No acute issue PAIN AND SEDATION: Currently not on any medication use PRN meds  Skin/Wound:  Chronic changes  Electrolytes: Replace electrolytes per ICU electrolyte replacement protocol.   IVF: none  Nutrition: Tube feeds as tolerated  Prophylaxis: DVT Prophylaxis with heparin,. GI Prophylaxis.   Restraints: None  PT/OT eval and treat. OOB when appropriate.   Lines/Tubes: From 08/19/2018 Foley no central line. ADVANCE DIRECTIVE: Full code needs to address CODE STATUS with the patient's family. FAMILY DISCUSSION: Palliative care to follow with the family Quality Care: PPI, DVT prophylaxis, HOB elevated, Infection control all reviewed and addressed. Events and notes from last 24 hours reviewed. Care plan discussed on multidisciplinary rounds CC TIME: 84     Old records reviewed discussed results and management plan with patient  Images personally reviewed and results and labs reviewed and discussed with patient.  All medication reviewed and adjusted  Further management depending on test results and work up as outlined above.   Roseanne Reno, M.D

## 2018-08-05 NOTE — Consult Note (Signed)
Pharmacy Antibiotic Note  Matthew Mayer is a 57 y.o. male admitted on 07/31/2018 with sepsis secondary to bilateral pneumonia.  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 He recently had the AV fistula placed for HD by Dr Gilda CreaseSchnier. There is a possibility that nephrology will begin hemodialysis, in which case we will adjust therapy. Pharmacy has been consulted for levofloxacin dosing.   Plan: Per discussion with CCM during morning rounds, continue Levofloxacin 750mg  IV q48h for a total of 7 days (end date 11/17).    Height: 5\' 8"  (172.7 cm) Weight: 262 lb 9.1 oz (119.1 kg) IBW/kg (Calculated) : 68.4  Temp (24hrs), Avg:98.8 F (37.1 C), Min:98.4 F (36.9 C), Max:99.7 F (37.6 C)  Recent Labs  Lab 08/21/2018 1730 07/27/2018 1911 08/02/18 0356 08/02/18 0522 08/02/18 2115 08/03/18 0504 08/04/18 0729 08/05/18 0538  WBC 18.4*  --  12.1*  --   --  11.4*  --  8.3  CREATININE 3.94*  --  3.78*  --  3.63*  --  3.44* 3.30*  LATICACIDVEN 0.9 0.7  --  1.3  --   --   --   --     Estimated Creatinine Clearance: 31 mL/min (A) (by C-G formula based on SCr of 3.3 mg/dL (H)).    Allergies  Allergen Reactions  . Augmentin [Amoxicillin-Pot Clavulanate] Anaphylaxis  . Penicillins Anaphylaxis    Has patient had a PCN reaction causing immediate rash, facial/tongue/throat swelling, SOB or lightheadedness with hypotension: Yes Has patient had a PCN reaction causing severe rash involving mucus membranes or skin necrosis: No Has patient had a PCN reaction that required hospitalization: Yes Has patient had a PCN reaction occurring within the last 10 years: Yes If all of the above answers are "NO", then may proceed with Cephalosporin use.   . Codeine Itching  . Biaxin [Clarithromycin] Rash    Antimicrobials this admission: vancomycin 11/8 >> 11/11 Flagyl 11/8 >> 11/9 Aztreonam 11/8 >> 11/11 Levofloxacin 11/11 >> 11/17  Microbiology results: 11/9 Resp Cx: normal respiratory flora  11/8 BCx: NG x 4 days   11/8 UCx: negative    11/8 MRSA PCR: negative   Thank you for allowing pharmacy to be a part of this patient's care.  Broadus Johnhaeyeong Violeta Lecount, PharmD candidate  08/05/2018 11:29 AM

## 2018-08-05 NOTE — Consult Note (Signed)
Consultation Note Date: 08/05/2018   Patient Name: Matthew Mayer  DOB: April 27, 1961  MRN: [id number]  Age / Sex: 57 y.o., male  PCP: Matthew Hartigan, MD Referring Physician: Fritzi Mandes, MD  Reason for Consultation: Establishing goals of care  HPI/Patient Profile: 57 y.o. male  with past medical history of OSA, COPD, chronic trach, HTN, HLD, GERD, DM, CKD stage IV, depression, anemia, CHF, recent AV fistula placement admitted on 08/12/2018 with altered mental status. Patient was found unresponsive at home by wife. When EMS arrived, blood sugar was 45 and spouse reported seizure-like activity. Posturing noted by EMS. Oxygen was found to be 70's with trach collar off. In ED, CT head negative for acute findings. Chest xray revealed bilateral pulmonary infiltrates concern for pneumonia versus pulmonary edema. Trach exchanged with cuff and patient placed on full ventilator support. Continues to be unresponsive. Neurology following. Repeat CT negative for acute findings. EEG abnormal secondary to background slowing, possible metabolic encephalopathy. Palliative medicine consultation for goals of care.   Clinical Assessment and Goals of Care:  I have reviewed medical records, discussed with care team, and met with patient, wife Matthew Mayer), and patient's aunt Matthew Mayer) at bedside to discuss diagnosis, Matthew Mayer, EOL wishes, disposition and options.  Patient currently on high flow trach collar and maintaining saturations in mid 90's. Attempted to cough up secretions. Patient responsive to painful stimuli but does not follow commands. He does respond (resists) when pupil assessment performed.  Introduced Palliative Medicine as specialized medical care for people living with serious illness. It focuses on providing relief from the symptoms and stress of a serious illness. The goal is to improve quality of life for both the patient and  the family.  We discussed an extensive life review of the patient. Matthew Mayer shares that prior to health conditions, Matthew Mayer and traveled weekly for work. He always enjoyed "tinkering" and fixing things. They have been married for 35 years and have two dogs. No children.  Matthew Mayer shares that 5 years ago, his health greatly declined following gallbladder surgery. Post-op, he suffered from respiratory failure and was not able to wean from the ventilator, therefore required the trach. He had an extended hospitalization followed by admission at [facility]. He was able to return home with [place] just over one year after this event. Baseline prior to admission, patient awake, alert, oriented and able to ambulate without assistance. He requires 24/7 oxygen via trach collar.   Matthew Mayer does share that two weeks prior to this admission, the patient was having increased weakness and poor appetite. It was recently discovered that he was anemic and required one blood transfusion at the cancer center and was just starting weekly iron infusions. AV fistula was placed prior two days prior to admission, and patient was feeling fine after the procedure.   Discussed events leading up to hospitalization and course of hospital diagnoses and interventions. Reviewed medications, labs, and test results with wife. She shares that she was worried after her conversation with Dr. Alva Garnet yesterday (about  possibly having unfixable brain damage) but after further discussion with Dr. Doy Mince, she believes her husband "just needs more time."  Again, Matthew Mayer speaks of his prolonged hospitalization/rehab after gb surgery, but was able to return home. She believes that due to his many co-morbidities, he will take longer to show improvement. 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   Answered questions and concerns for wife this afternoon. PMT contact  information given. Plan to f/u with her tomorrow afternoon to further discuss clinical status and goals of care.    SUMMARY OF RECOMMENDATIONS    Good initial [place] discussion with patient's wife, [place]. Introduced palliative medicine.   Continue FULL code/FULL scope treatment. Matthew Mayer believes he "just needs more time" to recover with multiple underlying co-morbidities.  I plan to follow-up with [facility] tomorrow afternoon to further discuss goals and advanced directives.   Code Status/Advance Care Planning:  Full code  Symptom Management:   Per attending  Palliative Prophylaxis:   Aspiration, Delirium Protocol, Oral Care and Turn Reposition  Additional Recommendations (Limitations, Scope, Preferences):  Full Scope Treatment  Psycho-social/Spiritual:   Desire for further Chaplaincy support: yes  Additional Recommendations: Caregiving  Support/Resources, Compassionate Wean Education and Education on Hospice  Prognosis:   Unable to determine: guarded  Discharge Planning: To Be Determined      Primary Diagnoses: Present on Admission: . Sepsis ([place])   I have reviewed the medical record, interviewed the patient and family, and examined the patient. The following aspects are pertinent.  Past Medical History:  Diagnosis Date  . Anemia   . Chronic kidney disease    stage 4  . COPD (chronic obstructive pulmonary disease) ([place])   . Depression   . Diabetes ([place])   . GERD (gastroesophageal reflux disease)   . Hyperlipidemia   . Hypertension   . MRSA (methicillin resistant Staphylococcus aureus)    pt states had in past  . Sleep apnea    Social History   Socioeconomic History  . Marital status: Married    Spouse name: Not on file  . Number of children: Not on file  . Years of education: Not on file  . Highest education level: Not on file  Occupational History  . Not on file  Social Needs  . Financial resource strain: Not on file  . Food insecurity:    Worry: Not  on file    Inability: Not on file  . Transportation needs:    Medical: Not on file    Non-medical: Not on file  Tobacco Use  . Smoking status: Former Smoker    Packs/day: 3.00    Years: 38.00    Pack years: 114.00    Types: Cigarettes    Last attempt to quit: 04/30/2013    Years since quitting: 5.2  . Smokeless tobacco: Never Used  Substance and Sexual Activity  . Alcohol use: Not Currently  . Drug use: No  . Sexual activity: Not on file  Lifestyle  . Physical activity:    Days per week: Not on file    Minutes per session: Not on file  . Stress: Not on file  Relationships  . Social connections:    Talks on phone: Not on file    Gets together: Not on file    Attends religious service: Not on file    Active member of club or organization: Not on file    Attends  meetings of clubs or organizations: Not on file    Relationship status: Not on file  Other Topics Concern  . Not on file  Social History Narrative  . Not on file   Family History  Problem Relation Age of Onset  . Coronary artery disease Mother   . Hypertension Mother   . Osteoporosis Mother   . Heart attack Mother   . Diabetes Father   . Hypertension Father   . CVA Father   . Stroke Father   . Coronary artery disease Sister   . Diabetes Sister   . Hearing loss Sister   . Hyperlipidemia Sister   . Lung disease Sister   . Stroke Sister   . Colon polyps Brother   . Diabetes Brother   . Hearing loss Brother   . Hypertension Brother   . Cancer Maternal Grandmother   . Heart disease Maternal Grandfather   . Diabetes Paternal Grandmother   . Diabetes Paternal Grandfather    Scheduled Meds: . amLODipine  10 mg Per NG tube Daily  . carvedilol  25 mg Per Tube BID WC  . chlorhexidine gluconate (MEDLINE KIT)  15 mL Mouth Rinse BID  . famotidine  20 mg Per Tube Daily  . feeding supplement (PRO-STAT SUGAR FREE 64)  30 mL Per Tube TID  . free water  100 mL Per Tube Q4H  . heparin  5,000 Units Subcutaneous Q8H    . hydrALAZINE  75 mg Per Tube Q8H  . insulin aspart  0-15 Units Subcutaneous Q4H  . insulin aspart  4 Units Subcutaneous Q4H  . insulin glargine  22 Units Subcutaneous BID  . isosorbide dinitrate  30 mg Per Tube BID  . levofloxacin  750 mg Per Tube Q48H  . lisinopril  30 mg Per Tube Daily  . mouth rinse  15 mL Mouth Rinse 10 times per day  . multivitamin  15 mL Per Tube Daily  . sodium chloride flush  3 mL Intravenous Q12H   Continuous Infusions: . feeding supplement (VITAL HIGH PROTEIN) 1,000 mL (08/05/18 1051)   PRN Meds:.acetaminophen **OR** acetaminophen, bisacodyl, fentaNYL (SUBLIMAZE) injection, hydrALAZINE, ipratropium-albuterol, [DISCONTINUED] ondansetron **OR** ondansetron (ZOFRAN) IV, sennosides Medications Prior to Admission:  Prior to Admission medications   Medication Sig Start Date End Date Taking? Authorizing Provider  albuterol (ACCUNEB) 1.25 MG/3ML nebulizer solution Inhale 1 ampule into the lungs every 6 (six) hours as needed for wheezing or shortness of breath.    Yes [provider]  arformoterol (BROVANA) 15 MCG/2ML NEBU Take 15 mcg by nebulization 2 (two) times daily.    Yes [provider]  calcitRIOL (ROCALTROL) 0.25 MCG capsule Take 0.25 mcg by mouth every evening.  04/15/18  Yes [provider]  carvedilol (COREG) 25 MG tablet Take 25 mg by mouth 2 (two) times daily with a meal.   Yes [provider]  clindamycin (CLEOCIN T) 1 % external solution Apply 1 application topically 2 (two) times daily as needed (rash).    Yes [provider]  Continuous Blood Gluc Sensor (FREESTYLE LIBRE SENSOR SYSTEM) MISC USE 1 EACH EVERY 10 (TEN) DAYS E11.65 02/05/18  Yes [provider]  cyclobenzaprine (FLEXERIL) 10 MG tablet Take 10 mg by mouth at bedtime.  12/09/17  Yes [provider]  docusate sodium (COLACE) 100 MG capsule Take 100 mg by mouth at bedtime.   Yes [provider]  esomeprazole (NEXIUM) 20 MG  capsule Take 40 mg by mouth daily.    Yes  [provider]  fenofibrate (TRICOR) 145 MG tablet Take 145 mg by mouth every evening.  12/06/17  Yes [provider]  furosemide (LASIX) 40 MG tablet Take 40 mg by mouth See admin instructions. Take 40 mg daily may take a second 40 mg dose as needed for swelling or weight gain 2lbs in 24 hours   Yes [provider]  HUMULIN R 500 UNIT/ML injection Inject 25 Units into the skin 3 (three) times daily with meals.    Yes [provider]  hydrocortisone valerate cream (WESTCORT) 0.2 % Apply 1 application topically 2 (two) times daily as needed (itching / rash).  10/15/17  Yes [provider]  hydrOXYzine (ATARAX/VISTARIL) 25 MG tablet Take 25 mg by mouth at bedtime.    Yes [provider]  lisinopril (PRINIVIL,ZESTRIL) 20 MG tablet Take 20 mg by mouth at bedtime.    Yes [provider]  Melatonin 10 MG TABS Take 20 mg by mouth at bedtime.   Yes [provider]  Multiple Vitamin (MULTIVITAMIN) tablet Take 1 tablet by mouth daily.   Yes [provider]  naproxen sodium (ALEVE) 220 MG tablet Take 220 mg by mouth 2 (two) times daily as needed (for pain).    Yes [provider]  oxyCODONE-acetaminophen (PERCOCET) 5-325 MG tablet Take 1-2 tablets by mouth every 6 (six) hours as needed for moderate pain or severe pain. 07/30/18 07/30/19 Yes Schnier, Dolores Lory, MD  PARoxetine (PAXIL) 30 MG tablet Take 30 mg by mouth at bedtime.    Yes [provider]  promethazine (PHENERGAN) 25 MG tablet Take 25 mg by mouth every 8 (eight) hours as needed for nausea or vomiting.  01/30/17  Yes [provider]  Revefenacin 175 MCG/3ML SOLN Inhale 175 mcg into the lungs daily.   Yes [provider]  rosuvastatin (CRESTOR) 20 MG tablet Take 20 mg by mouth daily.  01/02/18  Yes [provider]  sodium chloride HYPERTONIC 3 % nebulizer solution Take 3 mLs by nebulization as  needed for cough.   Yes [provider]  Vitamin D, Ergocalciferol, (DRISDOL) 50000 units CAPS capsule Take 50,000 Units by mouth every 30 (thirty) days.  12/09/17  Yes [provider]   Allergies  Allergen Reactions  . Augmentin [Amoxicillin-Pot Clavulanate] Anaphylaxis  . Penicillins Anaphylaxis    Has patient had a PCN reaction causing immediate rash, facial/tongue/throat swelling, SOB or lightheadedness with hypotension: Yes Has patient had a PCN reaction causing severe rash involving mucus membranes or skin necrosis: No Has patient had a PCN reaction that required hospitalization: Yes Has patient had a PCN reaction occurring within the last 10 years: Yes If all of the above answers are "NO", then may proceed with Cephalosporin use.   . Codeine Itching  . Biaxin [Clarithromycin] Rash   Review of Systems  Unable to perform ROS: Acuity of condition   Physical Exam  Constitutional: He appears ill.  HENT:  Head: Normocephalic and atraumatic.  Cardiovascular: Regular rhythm.  Pulmonary/Chest: No accessory muscle usage. No tachypnea. No respiratory distress. He has rhonchi.  HF trach collar. Attempting to cough up secretions  Abdominal: There is no tenderness.  Neurological: GCS eye subscore is 2. GCS verbal subscore is 1. GCS motor subscore is 4.  Responds to pain. Does not follow commands. Responds to pupil assessment (uncooperative). PERRLA.  Skin: Skin is warm and dry. There is pallor.  Psychiatric: Cognition and memory are impaired. He is noncommunicative. He is inattentive.  Nursing  note and vitals reviewed.   Vital Signs: BP (!) 173/65   Pulse 84   Temp 99.9 F (37.7 C) (Bladder)   Resp (!) 23   Ht _0  (1.727 m)   Wt 119.1 kg   SpO2 96%   BMI 39.92 kg/m  Pain Scale: CPOT      SpO2: SpO2: 96 % O2 Device:SpO2: 96 % O2 Flow Rate: .O2 Flow Rate (L/min): 50 L/min  IO: Intake/output summary:   Intake/Output Summary (Last 24 hours) at 08/05/2018  1607 Last data filed at 08/05/2018 1400 Gross per 24 hour  Intake 1250 ml  Output 2775 ml  Net -1525 ml    LBM: Last BM Date: 08/05/18 Baseline Weight: Weight: 117 kg Most recent weight: Weight: 119.1 kg     Palliative Assessment/Data: PPS 20%   Flowsheet Rows     Most Recent Value  Intake Tab  Referral Department  Critical care  Unit at Time of Referral  ICU  Palliative Care Primary Diagnosis  Neurology  Palliative Care Type  New Palliative care  Reason for referral  Clarify Goals of Care  Date first seen by Palliative Care  08/05/18  Clinical Assessment  Palliative Performance Scale Score  20%  Psychosocial & Spiritual Assessment  Palliative Care Outcomes  Patient/Family meeting held?  Yes  Who was at the meeting?  wife, aunt  Palliative Care Outcomes  Clarified goals of care, ACP counseling assistance, Provided psychosocial or spiritual support      Time In: 1300 Time Out: 1415 Time Total: 70mn Greater than 50%  of this time was spent counseling and coordinating care related to the above assessment and plan.  Signed by:  MIhor Dow FNP-C Palliative Medicine Team  Phone: [phone]: [phone]  Please contact Palliative Medicine Team phone at [phone] questions and concerns.  For individual provider: See AShea Evans

## 2018-08-05 NOTE — Progress Notes (Signed)
[place], [place] 08/05/18  Subjective:  Patient seen at bedside. Remains critically ill. Still on the ventilator. Urine output was 2.2 L over the preceding 24 hours. Serum sodium high at 149.   Objective:  Vital signs in last 24 hours:  Temp:  [98.4 F (36.9 C)-99.7 F (37.6 C)] 99.3 F (37.4 C) (11/12 0800) Pulse Rate:  [64-84] 83 (11/12 0800) Resp:  [16-23] 19 (11/12 0800) BP: (110-181)/(50-73) 166/60 (11/12 0800) SpO2:  [93 %-99 %] 97 % (11/12 0806) FiO2 (%):  [35 %] 35 % (11/12 0806) Weight:  [119.1 kg] 119.1 kg (11/12 0433)  Weight change: -0.6 kg Filed Weights   08/21/2018 2100 08/04/18 0348 08/05/18 0433  Weight: 118.5 kg 119.7 kg 119.1 kg    Intake/Output:    Intake/Output Summary (Last 24 hours) at 08/05/2018 0853 Last data filed at 08/05/2018 0800 Gross per 24 hour  Intake 1632.36 ml  Output 2225 ml  Net -592.64 ml     Physical Exam: General:  Critically ill-appearing, laying in the bed  HEENT  dry oral mucosa  Neck  tracheostomy in place  Pulm/lungs  ventilator assisted.  Scattered rhonchi  CVS/Heart  regular, no rub  Abdomen:   Soft, obese  Extremities:  No peripheral edema  Neurologic:  Did not respond to verbal or tactile stimuli  Skin:  Warm, dry  Access:  Left upper extremity developing new  AV fistula       Basic Metabolic Panel:  Recent Labs  Lab 08/09/2018 1730 08/02/18 0356 08/02/18 0522 08/02/18 1145 08/02/18 1728 08/02/18 2115 08/03/18 0504 08/04/18 0729 08/05/18 0538  NA 139 144  --   --   --  140  --  145 149*  K 3.3* 3.1*  --   --   --  3.3*  --  3.4* 3.9  CL 108 108  --   --   --  106  --  111 116*  CO2 20* 23  --   --   --  23  --  24 26  GLUCOSE 143* 244*  --   --   --  387*  --  283* 306*  BUN 37* 39*  --   --   --  48*  --  63* 78*  CREATININE 3.94* 3.78*  --   --   --  3.63*  --  3.44* 3.30*  CALCIUM 8.0* 7.8*  --   --   --  8.1*  --  7.9* 8.3*  MG  --   --  1.4* 2.5* 2.1  --  2.2  2.0  --   PHOS  --   --  5.9* 5.5* 5.5*  --  3.5 3.7  --      CBC: Recent Labs  Lab 07/26/2018 1730 08/02/18 0356 08/03/18 0504 08/05/18 0538  WBC 18.4* 12.1* 11.4* 8.3  NEUTROABS 16.9*  --   --   --   HGB 9.7* 8.1* 8.7* 9.2*  HCT 32.1* 26.4* 28.2* 31.0*  MCV 86.5 86.3 84.4 89.3  PLT 291 233 286 267     No results found for: HEPBSAG, HEPBSAB, HEPBIGM    Microbiology:  Recent Results (from the past 240 hour(s))  Urine Culture     Status: None   Collection Time: 08/08/2018  5:32 PM  Result Value Ref Range Status   Specimen Description   Final    URINE, RANDOM Performed at [facility], [address], [place], [place] [address]  Special Requests   Final    NONE Performed at [facility], [address] [place], [place] [address]    Culture   Final    NO GROWTH Performed at [facility], [place] [address] [place], [place] [address]    Report Status 08/02/2018 FINAL  Final  Blood culture (routine x 2)     Status: None (Preliminary result)   Collection Time: 08/18/2018  7:10 PM  Result Value Ref Range Status   Specimen Description BLOOD RIGHT ANTECUBITAL  Final   Special Requests   Final    BOTTLES DRAWN AEROBIC AND ANAEROBIC Blood Culture adequate volume   Culture   Final    NO GROWTH 4 DAYS Performed at [facility], [address], [place], [place] [address]    Report Status PENDING  Incomplete  Blood culture (routine x 2)     Status: None (Preliminary result)   Collection Time: 08/23/2018  7:10 PM  Result Value Ref Range Status   Specimen Description BLOOD BLOOD RIGHT FOREARM  Final   Special Requests   Final    BOTTLES DRAWN AEROBIC AND ANAEROBIC Blood Culture adequate volume   Culture   Final    NO GROWTH 4 DAYS Performed at [facility], [address], [place], [place] [address]    Report Status PENDING  Incomplete  MRSA PCR Screening     Status: None   Collection Time: 07/28/2018  9:05 PM  Result Value Ref  Range Status   MRSA by PCR NEGATIVE NEGATIVE Final    Comment:        The GeneXpert MRSA Assay (FDA approved for NASAL specimens only), is one component of a comprehensive MRSA colonization surveillance program. It is not intended to diagnose MRSA infection nor to guide or monitor treatment for MRSA infections. Performed at [facility], [address] [place], [place] [address]   Culture, respiratory (non-expectorated)     Status: None   Collection Time: 08/02/18  5:04 AM  Result Value Ref Range Status   Specimen Description   Final    TRACHEAL ASPIRATE Performed at [facility], [address], [place], [place] [address]    Special Requests   Final    NONE Performed at [facility], [place], [place] [address]    Gram Stain   Final    MODERATE SQUAMOUS EPITHELIAL CELLS PRESENT ABUNDANT WBC PRESENT, PREDOMINANTLY PMN FEW GRAM POSITIVE RODS RARE GRAM POSITIVE COCCI    Culture   Final    FEW Consistent with normal respiratory flora. Performed at [facility], [place] [address], [place],  [address]    Report Status 08/04/2018 FINAL  Final    Coagulation Studies: No results for input(s): LABPROT, INR in the last 72 hours.  Urinalysis: No results for input(s): COLORURINE, LABSPEC, PHURINE, GLUCOSEU, HGBUR, BILIRUBINUR, KETONESUR, PROTEINUR, UROBILINOGEN, NITRITE, LEUKOCYTESUR in the last 72 hours.  Invalid input(s): APPERANCEUR    Imaging: Ct Head Wo Contrast  Result Date: 08/04/2018 CLINICAL DATA:  Altered level of consciousness. EXAM: CT HEAD WITHOUT CONTRAST TECHNIQUE: Contiguous axial images were obtained from the base of the skull through the vertex without intravenous contrast. COMPARISON:  CT head 08/18/2018 FINDINGS: Brain: No evidence of acute infarction, hemorrhage, hydrocephalus, extra-axial collection or mass lesion/mass effect. Vascular: Negative for hyperdense vessel. Atherosclerotic calcification  cavernous carotid bilaterally. Skull: Negative Sinuses/Orbits: Negative Other: None IMPRESSION: Negative CT head Electronically Signed   By: Franchot Gallo  M.D.   On: 08/04/2018 11:20   Dg Chest Port 1 View  Result Date: 08/04/2018 CLINICAL DATA:  Acute respiratory failure EXAM: PORTABLE CHEST 1 VIEW COMPARISON:  08/02/2018 FINDINGS: Cardiac shadow is mildly enlarged but stable. Tracheostomy tube and nasogastric catheter are noted in satisfactory position. The lungs are hypoinflated. Some patchy infiltrates are again seen in the bases particularly on the right. No significant interstitial edema is noted. No bony abnormality is seen. IMPRESSION: Mild bibasilar changes right greater than left stable from the prior exam. Tubes and lines as described. Electronically Signed   By: Inez Catalina M.D.   On: 08/04/2018 07:10     Medications:   . feeding supplement (VITAL HIGH PROTEIN) 1,000 mL (08/04/18 1000)  . nitroGLYCERIN Stopped (08/04/18 1215)   . amLODipine  10 mg Per NG tube Daily  . carvedilol  25 mg Per Tube BID WC  . chlorhexidine gluconate (MEDLINE KIT)  15 mL Mouth Rinse BID  . famotidine  20 mg Per Tube Daily  . feeding supplement (PRO-STAT SUGAR FREE 64)  30 mL Per Tube TID  . heparin  5,000 Units Subcutaneous Q8H  . hydrALAZINE  75 mg Per Tube Q8H  . insulin aspart  0-15 Units Subcutaneous Q4H  . insulin aspart  4 Units Subcutaneous Q4H  . insulin glargine  22 Units Subcutaneous BID  . isosorbide dinitrate  30 mg Per Tube BID  . levofloxacin  750 mg Per Tube Q48H  . lisinopril  20 mg Per Tube Daily  . mouth rinse  15 mL Mouth Rinse 10 times per day  . multivitamin  15 mL Per Tube Daily  . sodium chloride flush  3 mL Intravenous Q12H   acetaminophen **OR** acetaminophen, bisacodyl, fentaNYL (SUBLIMAZE) injection, hydrALAZINE, ipratropium-albuterol, [DISCONTINUED] ondansetron **OR** ondansetron (ZOFRAN) IV, sennosides  Assessment/ Plan:  57 y.o. 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  Acute on chronic kidney disease stage IV Baseline creatinine 3.13, GFR 21 from July 07, 2018 -Creatinine slightly better today at 3.3.  Urine output was 2.2 L over the preceding 24 hours.  Continue to monitor renal parameters.  No indication for dialysis at the moment.  2.  Altered mental status Differential includes hypoglycemia, hypoxia, seizure Urine drug screen is positive for benzodiazepines neurology evaluation is ongoing Doubt uremia.  3.  Diabetes type 2, insulin-dependent Poorly controlled Hemoglobin A1c 9.2% on 08/02/2018  4. Acute respiratory failure.  Patient remains on the ventilator.  Ventilatory support as per pulmonary/critical care.  5.  Hypernatremia.  Serum sodium high at 149.  Start the patient on D5W at 60 cc/h.  May need to adjust insulin dosage if sugars rise.   LOS: 4 Hernan Turnage 11/12/20198:53 AM  [place], [place]  Note: This note was prepared with Dragon dictation. Any transcription errors are unintentional

## 2018-08-05 NOTE — Progress Notes (Signed)
Subjective: Patient remains poorly responsive.    Objective: Current vital signs: BP 108/78   Pulse 88   Temp (!) 100.4 F (38 C)   Resp (!) 25   Ht 5' 8"  (1.727 m)   Wt 119.1 kg   SpO2 94%   BMI 39.92 kg/m  Vital signs in last 24 hours: Temp:  [98.4 F (36.9 C)-100.4 F (38 C)] 100.4 F (38 C) (11/12 1700) Pulse Rate:  [64-92] 88 (11/12 1700) Resp:  [16-27] 25 (11/12 1700) BP: (108-175)/(50-78) 108/78 (11/12 1700) SpO2:  [93 %-99 %] 94 % (11/12 1700) FiO2 (%):  [35 %-42 %] 40 % (11/12 1500) Weight:  [119.1 kg] 119.1 kg (11/12 0433)  Intake/Output from previous day: 11/11 0701 - 11/12 0700 In: 1402.4 [I.V.:112.4; NG/GT:1290] Out: 2575 [Urine:2575] Intake/Output this shift: Total I/O In: 750 [NG/GT:750] Out: 1200 [Urine:1200] Nutritional status:  Diet Order    None      Neurologic Exam: Mental Status: Patient does not respond to verbal stimuli.  Mild grimace to deep sternal rub.  Does not follow commands.  No verbalizations noted.  Cranial Nerves: II: patient does not respond confrontation bilaterally, pupils right 3 mm, left 3 mm,and reactive bilaterally III,IV,VI: doll's response present bilaterally.  V,VII: corneal reflex present bilaterally  VIII: patient does not respond to verbal stimuli IX,X: gag reflex reduced, XI: trapezius strength unable to test bilaterally XII: tongue strength unable to test Motor: Extremities flaccid throughout.  No spontaneous movement noted.  No purposeful movements noted. Sensory: Does not respond to noxious stimuli in any extremity.  Lab Results: Basic Metabolic Panel: Recent Labs  Lab 08/09/2018 1730 08/02/18 0356 08/02/18 0522 08/02/18 1145 08/02/18 1728 08/02/18 2115 08/03/18 0504 08/04/18 0729 08/05/18 0538  NA 139 144  --   --   --  140  --  145 149*  K 3.3* 3.1*  --   --   --  3.3*  --  3.4* 3.9  CL 108 108  --   --   --  106  --  111 116*  CO2 20* 23  --   --   --  23  --  24 26  GLUCOSE 143* 244*  --   --    --  387*  --  283* 306*  BUN 37* 39*  --   --   --  48*  --  63* 78*  CREATININE 3.94* 3.78*  --   --   --  3.63*  --  3.44* 3.30*  CALCIUM 8.0* 7.8*  --   --   --  8.1*  --  7.9* 8.3*  MG  --   --  1.4* 2.5* 2.1  --  2.2 2.0  --   PHOS  --   --  5.9* 5.5* 5.5*  --  3.5 3.7  --     Liver Function Tests: Recent Labs  Lab 07/28/2018 1730 08/02/18 0356 08/04/18 0729 08/05/18 0538  AST 30 22  --  39  ALT 17 16  --  16  ALKPHOS 124 99  --  121  BILITOT 0.7 1.0  --  0.7  PROT 7.7 6.8  --  6.9  ALBUMIN 2.5* 2.0* 2.0* 2.1*   Recent Labs  Lab 07/30/2018 1730  LIPASE 35   Recent Labs  Lab 08/09/2018 1730  AMMONIA 19    CBC: Recent Labs  Lab 08/22/2018 1730 08/02/18 0356 08/03/18 0504 08/05/18 0538  WBC 18.4* 12.1* 11.4* 8.3  NEUTROABS 16.9*  --   --   --  HGB 9.7* 8.1* 8.7* 9.2*  HCT 32.1* 26.4* 28.2* 31.0*  MCV 86.5 86.3 84.4 89.3  PLT 291 233 286 267    Cardiac Enzymes: Recent Labs  Lab 08/15/2018 1730 08/02/18 0522  TROPONINI 0.03* 0.06*    Lipid Panel: No results for input(s): CHOL, TRIG, HDL, CHOLHDL, VLDL, LDLCALC in the last 168 hours.  CBG: Recent Labs  Lab 08/05/18 0048 08/05/18 0403 08/05/18 0803 08/05/18 1151 08/05/18 1628  GLUCAP 308* 304* 188* 171* 65*    Microbiology: Results for orders placed or performed during the hospital encounter of 08/15/2018  Urine Culture     Status: None   Collection Time: 07/28/2018  5:32 PM  Result Value Ref Range Status   Specimen Description   Final    URINE, RANDOM Performed at [facility], [address], [place], [place] [address]    Special Requests   Final    NONE Performed at [facility], [address] [place], [place] [address]    Culture   Final    NO GROWTH Performed at [facility], [address], [place], [place] [address]    Report Status 08/02/2018 FINAL  Final  Blood culture (routine x 2)     Status: None (Preliminary result)   Collection Time: 08/15/2018  7:10  PM  Result Value Ref Range Status   Specimen Description BLOOD RIGHT ANTECUBITAL  Final   Special Requests   Final    BOTTLES DRAWN AEROBIC AND ANAEROBIC Blood Culture adequate volume   Culture   Final    NO GROWTH 4 DAYS Performed at [facility], [address] [place], [place] [address]    Report Status PENDING  Incomplete  Blood culture (routine x 2)     Status: None (Preliminary result)   Collection Time: 08/14/2018  7:10 PM  Result Value Ref Range Status   Specimen Description BLOOD BLOOD RIGHT FOREARM  Final   Special Requests   Final    BOTTLES DRAWN AEROBIC AND ANAEROBIC Blood Culture adequate volume   Culture   Final    NO GROWTH 4 DAYS Performed at [facility], [address], [place], [place] [address]    Report Status PENDING  Incomplete  MRSA PCR Screening     Status: None   Collection Time: 08/09/2018  9:05 PM  Result Value Ref Range Status   MRSA by PCR NEGATIVE NEGATIVE Final    Comment:        The GeneXpert MRSA Assay (FDA approved for NASAL specimens only), is one component of a comprehensive MRSA colonization surveillance program. It is not intended to diagnose MRSA infection nor to guide or monitor treatment for MRSA infections. Performed at [facility], [address] [place], [place] [address]   Culture, respiratory (non-expectorated)     Status: None   Collection Time: 08/02/18  5:04 AM  Result Value Ref Range Status   Specimen Description   Final    TRACHEAL ASPIRATE Performed at [facility], [address] [place],  [address]    Special Requests   Final    NONE Performed at [facility], [place],  [address]    Gram Stain   Final    MODERATE SQUAMOUS EPITHELIAL CELLS PRESENT ABUNDANT WBC PRESENT, PREDOMINANTLY PMN FEW GRAM POSITIVE RODS RARE GRAM POSITIVE COCCI    Culture   Final    FEW Consistent with normal respiratory flora. Performed at [facility], [address] [place], [place] [address]    Report Status 08/04/2018 FINAL  Final    Coagulation Studies: No results for input(s): LABPROT, INR in the last 72 hours.  Imaging: Ct Head Wo Contrast  Result Date: 08/04/2018 CLINICAL DATA:  Altered level of consciousness. EXAM: CT HEAD WITHOUT CONTRAST TECHNIQUE: Contiguous axial images were obtained from the base of the skull through the vertex without intravenous contrast. COMPARISON:  CT head 08/23/2018 FINDINGS: Brain: No evidence of acute infarction, hemorrhage, hydrocephalus, extra-axial collection or mass lesion/mass effect. Vascular: Negative for hyperdense vessel. Atherosclerotic calcification cavernous carotid bilaterally. Skull: Negative Sinuses/Orbits: Negative Other: None IMPRESSION: Negative CT head Electronically Signed   By: Franchot Gallo M.D.   On: 08/04/2018 11:20   Dg Chest Port 1 View  Result Date: 08/05/2018 CLINICAL DATA:  Acute respiratory failure EXAM: PORTABLE CHEST 1 VIEW COMPARISON:  08/04/2018 FINDINGS: Tracheostomy and NG tube are unchanged. Cardiomegaly. Mild vascular congestion and bibasilar opacities, likely atelectasis. No overt edema or effusions. No acute bony abnormality. IMPRESSION: Cardiomegaly with vascular congestion.  Mild bibasilar atelectasis. Electronically Signed   By: Rolm Baptise M.D.   On: 08/05/2018 08:54   Dg Chest Port 1 View  Result Date: 08/04/2018 CLINICAL DATA:  Acute respiratory failure EXAM: PORTABLE CHEST 1 VIEW COMPARISON:  08/02/2018 FINDINGS: Cardiac shadow is mildly enlarged but stable. Tracheostomy tube and nasogastric catheter are noted in satisfactory position. The lungs are hypoinflated. Some patchy infiltrates are again seen in the bases particularly on the right. No significant interstitial edema is noted. No bony abnormality is seen. IMPRESSION: Mild bibasilar changes right greater than left stable from the prior exam. Tubes and lines as described. Electronically  Signed   By: Inez Catalina M.D.   On: 08/04/2018 07:10    Medications:  I have reviewed the patient's current medications. Scheduled: . amLODipine  10 mg Per NG tube Daily  . carvedilol  25 mg Per Tube BID WC  . chlorhexidine gluconate (MEDLINE KIT)  15 mL Mouth Rinse BID  . famotidine  20 mg Per Tube Daily  . feeding supplement (PRO-STAT SUGAR FREE 64)  30 mL Per Tube TID  . free water  100 mL Per Tube Q4H  . heparin  5,000 Units Subcutaneous Q8H  . hydrALAZINE  75 mg Per Tube Q8H  . insulin aspart  0-15 Units Subcutaneous Q4H  . insulin aspart  4 Units Subcutaneous Q4H  . insulin glargine  22 Units Subcutaneous BID  . isosorbide dinitrate  30 mg Per Tube BID  . levofloxacin  750 mg Per Tube Q48H  . lisinopril  30 mg Per Tube Daily  . mouth rinse  15 mL Mouth Rinse 10 times per day  . multivitamin  15 mL Per Tube Daily  . sodium chloride flush  3 mL Intravenous Q12H    Assessment/Plan: Patient remains unresponsive.  Repeat head CT unremarkable.  EEG only significant for slowing.    Recommendations: 1. Will continue to follow with you.  If no improvement noted will consider MRI of the brain   LOS: 4 days   Alexis Goodell, MD Neurology [phone] 08/05/2018  5:56 PM

## 2018-08-05 NOTE — Progress Notes (Signed)
[facility] [place] [facility]   PATIENT NAME: Matthew Mayer    MR#:  [id number]  DATE OF BIRTH:  26-Nov-1960  SUBJECTIVE:   Remains unresponsive. No response to verbal stimuli painful stimuli. Wife in the room REVIEW OF SYSTEMS:   Review of Systems  Unable to perform ROS: Intubated   DRUG ALLERGIES:   Allergies  Allergen Reactions  . Augmentin [Amoxicillin-Pot Clavulanate] Anaphylaxis  . Penicillins Anaphylaxis    Has patient had a PCN reaction causing immediate rash, facial/tongue/throat swelling, SOB or lightheadedness with hypotension: Yes Has patient had a PCN reaction causing severe rash involving mucus membranes or skin necrosis: No Has patient had a PCN reaction that required hospitalization: Yes Has patient had a PCN reaction occurring within the last 10 years: Yes If all of the above answers are "NO", then may proceed with Cephalosporin use.   . Codeine Itching  . Biaxin [Clarithromycin] Rash    VITALS:  Blood pressure (!) 173/65, pulse 84, temperature 99.9 F (37.7 C), temperature source Bladder, resp. rate (!) 23, height 5\' 8"  (1.727 m), weight 119.1 kg, SpO2 96 %.  PHYSICAL EXAMINATION:   Physical Exam  GENERAL:  57 y.o.-year-old patient lying in the bed with no acute distress. Critically and chronically ill EYES: Pupils equal, round, reactive to light and accommodation. No scleral icterus. Extraocular muscles intact.  HEENT: Head atraumatic, normocephalic. Oropharynx and nasopharynx clear. Tracheostomy++ NECK:  Supple, no jugular venous distention. No thyroid enlargementTrach ++ LUNGS: Normal breath sounds bilaterally, no wheezing, rales, rhonchi. No use of accessory muscles of respiration.  CARDIOVASCULAR: S1, S2 normal. No murmurs, rubs, or gallops.  ABDOMEN: Soft, nontender, nondistended. Bowel sounds present. No organomegaly or mass.  EXTREMITIES: No cyanosis, clubbing or edema b/l.    NEUROLOGIC: intubated  PSYCHIATRIC:   Intubated  SKIN: No obvious rash, lesion, or ulcer.   LABORATORY PANEL:  CBC Recent Labs  Lab 08/05/18 0538  WBC 8.3  HGB 9.2*  HCT 31.0*  PLT 267    Chemistries  Recent Labs  Lab 08/04/18 0729 08/05/18 0538  NA 145 149*  K 3.4* 3.9  CL 111 116*  CO2 24 26  GLUCOSE 283* 306*  BUN 63* 78*  CREATININE 3.44* 3.30*  CALCIUM 7.9* 8.3*  MG 2.0  --   AST  --  39  ALT  --  16  ALKPHOS  --  121  BILITOT  --  0.7   Cardiac Enzymes Recent Labs  Lab 08/02/18 0522  TROPONINI 0.06*   RADIOLOGY:  Ct Head Wo Contrast  Result Date: 08/04/2018 CLINICAL DATA:  Altered level of consciousness. EXAM: CT HEAD WITHOUT CONTRAST TECHNIQUE: Contiguous axial images were obtained from the base of the skull through the vertex without intravenous contrast. COMPARISON:  CT head 07/27/2018 FINDINGS: Brain: No evidence of acute infarction, hemorrhage, hydrocephalus, extra-axial collection or mass lesion/mass effect. Vascular: Negative for hyperdense vessel. Atherosclerotic calcification cavernous carotid bilaterally. Skull: Negative Sinuses/Orbits: Negative Other: None IMPRESSION: Negative CT head Electronically Signed   By: Marlan Palauharles  Clark M.D.   On: 08/04/2018 11:20   Dg Chest Port 1 View  Result Date: 08/05/2018 CLINICAL DATA:  Acute respiratory failure EXAM: PORTABLE CHEST 1 VIEW COMPARISON:  08/04/2018 FINDINGS: Tracheostomy and NG tube are unchanged. Cardiomegaly. Mild vascular congestion and bibasilar opacities, likely atelectasis. No overt edema or effusions. No acute bony abnormality. IMPRESSION: Cardiomegaly with vascular congestion.  Mild bibasilar atelectasis. Electronically Signed   By: Charlett NoseKevin  Dover M.D.   On: 08/05/2018  08:54   Dg Chest Port 1 View  Result Date: 08/04/2018 CLINICAL DATA:  Acute respiratory failure EXAM: PORTABLE CHEST 1 VIEW COMPARISON:  08/02/2018 FINDINGS: Cardiac shadow is mildly enlarged but stable. Tracheostomy tube and nasogastric catheter are noted in  satisfactory position. The lungs are hypoinflated. Some patchy infiltrates are again seen in the bases particularly on the right. No significant interstitial edema is noted. No bony abnormality is seen. IMPRESSION: Mild bibasilar changes right greater than left stable from the prior exam. Tubes and lines as described. Electronically Signed   By: Alcide Clever M.D.   On: 08/04/2018 07:10   ASSESSMENT AND PLAN:  Matthew Mayer  is a 57 y.o. male with a known history of CKD stage IV, hypertension, diabetes mellitus, COPD with tracheostomy who had AV fistula surgery 2 days back presents to the hospital brought in by EMS after he was found unresponsive by family.  He was found to have blood glucose of 42 along with oxygen saturations in the 70s.  Unclear of how long patient was down for.  He was given dextrose and put on oxygen and brought to the emergency room.   *Severe sepsis with bilateral pneumonia and acute on chronic hypoxic respiratory failure with acute encephalopathy Patient is critically ill.  -  Patient no on full ventilatory support via his tracheostomy since he was using accessory muscles and is unresponsive to respiratory failure. -No IV fluids due to pulmonary edema. --Broad-spectrum with Levaquin  *Acute encephalopathy metabolic versus anoxic -seen by neurology. -EEG results shows generalized slowing -repeat CT had negative  *CKD stage IV.   - Consult nephrology if dialysis is needed. -Patient had fistula placement in preparation for hemodialysis two days ago. -Received nephrology input   *Hypoglycemia with diabetes mellitus.  - resolved  *Hypertension.  IV hydralazine rn  *DVT prophylaxis with heparin  Overall carries a poor prognosis  Case discussed with Care Management/Social Worker. Management plans discussed with the patient, family and they are in agreement.  CODE STATUS:full  DVT Prophylaxis: heparin  TOTAL TIME TAKING CARE OF THIS PATIENT: *30* minutes.   >50% time spent on counselling and coordination of care  POSSIBLE D/C IN few* DAYS, DEPENDING ON CLINICAL CONDITION.  Note: This dictation was prepared with Dragon dictation along with smaller phrase technology. Any transcriptional errors that result from this process are unintentional.  Enedina Finner M.D on 08/05/2018 at 3:36 PM  Between 7am to 6pm - Pager - [phone]  After 6pm go to [email or web address] - Social research officer, government  [facility]  Office  [phone]  CC: Primary care physician; Marina Goodell, MDPatient ID: [name], male   DOB: 08-Nov-1960, 57 y.o.   MRN: [id number]

## 2018-08-05 NOTE — Progress Notes (Signed)
*  PRELIMINARY RESULTS* Echocardiogram 2D Echocardiogram has been performed. Dr. Juliann Paresallwood to read.  Joanette GulaJoan M Jalicia Roszak 08/05/2018, 2:45 PM

## 2018-08-06 ENCOUNTER — Inpatient Hospital Stay: Payer: BLUE CROSS/BLUE SHIELD

## 2018-08-06 LAB — BASIC METABOLIC PANEL
ANION GAP: 11 (ref 5–15)
BUN: 90 mg/dL — ABNORMAL HIGH (ref 6–20)
CALCIUM: 8.4 mg/dL — AB (ref 8.9–10.3)
CO2: 25 mmol/L (ref 22–32)
Chloride: 114 mmol/L — ABNORMAL HIGH (ref 98–111)
Creatinine, Ser: 3.08 mg/dL — ABNORMAL HIGH (ref 0.61–1.24)
GFR, EST AFRICAN AMERICAN: 24 mL/min — AB (ref >60)
GFR, EST NON AFRICAN AMERICAN: 21 mL/min — AB (ref >60)
Glucose, Bld: 261 mg/dL — ABNORMAL HIGH (ref 70–99)
Potassium: 3.5 mmol/L (ref 3.5–5.1)
Sodium: 150 mmol/L — ABNORMAL HIGH (ref 135–145)

## 2018-08-06 LAB — CBC WITH DIFFERENTIAL/PLATELET
ABS IMMATURE GRANULOCYTES: 0.03 10*3/uL (ref 0.00–0.07)
BASOS ABS: 0.1 10*3/uL (ref 0.0–0.1)
BASOS PCT: 1 %
Eosinophils Absolute: 0.2 10*3/uL (ref 0.0–0.5)
Eosinophils Relative: 2 %
HCT: 31.2 % — ABNORMAL LOW (ref 39.0–52.0)
Hemoglobin: 9.2 g/dL — ABNORMAL LOW (ref 13.0–17.0)
Immature Granulocytes: 0 %
LYMPHS PCT: 22 %
Lymphs Abs: 2 10*3/uL (ref 0.7–4.0)
MCH: 26.3 pg (ref 26.0–34.0)
MCHC: 29.5 g/dL — ABNORMAL LOW (ref 30.0–36.0)
MCV: 89.1 fL (ref 80.0–100.0)
Monocytes Absolute: 0.8 10*3/uL (ref 0.1–1.0)
Monocytes Relative: 9 %
Neutro Abs: 5.9 10*3/uL (ref 1.7–7.7)
Neutrophils Relative %: 66 %
PLATELETS: 257 10*3/uL (ref 150–400)
RBC: 3.5 MIL/uL — ABNORMAL LOW (ref 4.22–5.81)
RDW: 17.1 % — AB (ref 11.5–15.5)
WBC: 8.9 10*3/uL (ref 4.0–10.5)
nRBC: 0 % (ref 0.0–0.2)

## 2018-08-06 LAB — CULTURE, BLOOD (ROUTINE X 2)
CULTURE: NO GROWTH
Culture: NO GROWTH
Special Requests: ADEQUATE
Special Requests: ADEQUATE

## 2018-08-06 LAB — MAGNESIUM: MAGNESIUM: 2.5 mg/dL — AB (ref 1.7–2.4)

## 2018-08-06 LAB — BLOOD GAS, ARTERIAL
Acid-Base Excess: 1.9 mmol/L (ref 0.0–2.0)
BICARBONATE: 26.6 mmol/L (ref 20.0–28.0)
FIO2: 0.35
O2 Saturation: 96.9 %
PATIENT TEMPERATURE: 37
PEEP: 5 cmH2O
PRESSURE SUPPORT: 8 cmH2O
pCO2 arterial: 41 mmHg (ref 32.0–48.0)
pH, Arterial: 7.42 (ref 7.350–7.450)
pO2, Arterial: 88 mmHg (ref 83.0–108.0)

## 2018-08-06 LAB — GLUCOSE, CAPILLARY
Glucose-Capillary: 214 mg/dL — ABNORMAL HIGH (ref 70–99)
Glucose-Capillary: 210 mg/dL — ABNORMAL HIGH (ref 70–99)
Glucose-Capillary: 293 mg/dL — ABNORMAL HIGH (ref 70–99)
Glucose-Capillary: 279 mg/dL — ABNORMAL HIGH (ref 70–99)
Glucose-Capillary: 232 mg/dL — ABNORMAL HIGH (ref 70–99)

## 2018-08-06 LAB — ECHOCARDIOGRAM COMPLETE
Height: 68 in
Weight: 4201.09 oz

## 2018-08-06 MED ORDER — INSULIN GLARGINE 100 UNIT/ML ~~LOC~~ SOLN
24.0000 [IU] | Freq: Two times a day (BID) | SUBCUTANEOUS | Status: DC
  Administered 2018-08-06 – 2018-08-08 (×4): 24 [IU] via SUBCUTANEOUS
  Filled 2018-08-06 (×5): qty 0.24

## 2018-08-06 MED ORDER — VANCOMYCIN HCL IN DEXTROSE 1-5 GM/200ML-% IV SOLN
1000.0000 mg | Freq: Once | INTRAVENOUS | Status: AC
  Administered 2018-08-06: 1000 mg via INTRAVENOUS
  Filled 2018-08-06: qty 200

## 2018-08-06 MED ORDER — CARVEDILOL 25 MG PO TABS
37.5000 mg | ORAL_TABLET | Freq: Two times a day (BID) | ORAL | Status: DC
  Administered 2018-08-06 – 2018-08-10 (×9): 37.5 mg
  Filled 2018-08-06: qty 3
  Filled 2018-08-06: qty 2
  Filled 2018-08-06 (×4): qty 3
  Filled 2018-08-06 (×3): qty 2

## 2018-08-06 MED ORDER — VANCOMYCIN HCL IN DEXTROSE 1-5 GM/200ML-% IV SOLN
1000.0000 mg | INTRAVENOUS | Status: DC
  Administered 2018-08-07 – 2018-08-08 (×2): 1000 mg via INTRAVENOUS
  Filled 2018-08-06 (×3): qty 200

## 2018-08-06 MED ORDER — FREE WATER
200.0000 mL | Status: DC
  Administered 2018-08-06 – 2018-08-11 (×30): 200 mL

## 2018-08-06 MED ORDER — DEXTROSE 5 % IV SOLN
INTRAVENOUS | Status: DC
  Administered 2018-08-06: 30 mL/h via INTRAVENOUS
  Administered 2018-08-07: 23:00:00 via INTRAVENOUS
  Administered 2018-08-09: 30 mL/h via INTRAVENOUS
  Administered 2018-08-10: 17:00:00 via INTRAVENOUS

## 2018-08-06 MED ORDER — LISINOPRIL 5 MG PO TABS
10.0000 mg | ORAL_TABLET | Freq: Once | ORAL | Status: AC
  Administered 2018-08-06: 10 mg via ORAL
  Filled 2018-08-06: qty 2

## 2018-08-06 MED ORDER — LISINOPRIL 20 MG PO TABS
40.0000 mg | ORAL_TABLET | Freq: Every day | ORAL | Status: DC
  Administered 2018-08-07 – 2018-08-08 (×2): 40 mg
  Filled 2018-08-06 (×2): qty 2

## 2018-08-06 MED ORDER — SODIUM CHLORIDE 0.9% FLUSH
10.0000 mL | INTRAVENOUS | Status: DC | PRN
  Administered 2018-08-12: 20:00:00 10 mL
  Filled 2018-08-06: qty 40

## 2018-08-06 NOTE — Consult Note (Signed)
Pharmacy Antibiotic Note  Matthew OharaDonald W Mayer is a 57 y.o. male admitted on 07/30/2018 with sepsis secondary to bilateral pneumonia.  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 He recently had the AV fistula placed for HD by Dr Gilda CreaseSchnier. There is a possibility that nephrology will begin hemodialysis, in which case we will adjust therapy. Pharmacy has been consulted for levofloxacin and vancomycin dosing.   Plan: Continue Levofloxacin 750mg  IV q48h for a total of 7 days (end date 11/17).     Per discussion with CCM on morning rounds, re-start vancomycin for empiric coverage with the pending cultures as patient spiked fever yesterday evening ~1700 to early this morning 0030. Patient received vancomycin 1000 mg IV x 1 dose at 1344. Continue vancomycin 1000 mg IV q18h.  Vanc trough goal 15-20.   Ke = 0.031 T1/2 = 22.35 Vd = 59.86 Adjusted CrCl = 32 mL/min  Adjusted body weight = 85.52 kg   Height: 5\' 8"  (172.7 cm) Weight: 245 lb 2.4 oz (111.2 kg) IBW/kg (Calculated) : 68.4  Temp (24hrs), Avg:99.7 F (37.6 C), Min:98.1 F (36.7 C), Max:101.3 F (38.5 C)  Recent Labs  Lab 08/12/2018 1730 08/22/2018 1911 08/02/18 0356 08/02/18 0522 08/02/18 2115 08/03/18 0504 08/04/18 0729 08/05/18 0538 08/06/18 0436  WBC 18.4*  --  12.1*  --   --  11.4*  --  8.3 8.9  CREATININE 3.94*  --  3.78*  --  3.63*  --  3.44* 3.30* 3.08*  LATICACIDVEN 0.9 0.7  --  1.3  --   --   --   --   --     Estimated Creatinine Clearance: 32 mL/min (A) (by C-G formula based on SCr of 3.08 mg/dL (H)).    Allergies  Allergen Reactions  . Augmentin [Amoxicillin-Pot Clavulanate] Anaphylaxis  . Penicillins Anaphylaxis    Has patient had a PCN reaction causing immediate rash, facial/tongue/throat swelling, SOB or lightheadedness with hypotension: Yes Has patient had a PCN reaction causing severe rash involving mucus membranes or skin necrosis: No Has patient had a PCN reaction that required hospitalization: Yes Has patient had a  PCN reaction occurring within the last 10 years: Yes If all of the above answers are "NO", then may proceed with Cephalosporin use.   . Codeine Itching  . Biaxin [Clarithromycin] Rash    Antimicrobials this admission: vancomycin 11/8 >> 11/11                      11/13 >>  Flagyl 11/8 >> 11/9 Aztreonam 11/8 >> 11/11 Levofloxacin 11/11 >> 11/17  Microbiology results: 11/13 Resp Cx: sent  11/13: BCx x 2: sent  11/9 Resp Cx: normal respiratory flora  11/8 BCx: negative  11/8 UCx: negative    11/8 MRSA PCR: negative   Thank you for allowing pharmacy to be a part of this patient's care.  Matthew Mayer, PharmD candidate  08/06/2018 1:27 PM

## 2018-08-06 NOTE — Progress Notes (Signed)
[place], [place] 08/06/18  Subjective:  The patient remains critically ill. Still on the ventilator. Only responds to painful stimuli.   Objective:  Vital signs in last 24 hours:  Temp:  [98.1 F (36.7 C)-101.3 F (38.5 C)] 98.2 F (36.8 C) (11/13 0630) Pulse Rate:  [66-92] 79 (11/13 0806) Resp:  [16-35] 19 (11/13 0630) BP: (106-174)/(53-114) 154/64 (11/13 0806) SpO2:  [90 %-99 %] 94 % (11/13 0802) FiO2 (%):  [35 %-42 %] 35 % (11/13 0802) Weight:  [111.2 kg] 111.2 kg (11/13 0500)  Weight change: -7.9 kg Filed Weights   08/04/18 0348 08/05/18 0433 08/06/18 0500  Weight: 119.7 kg 119.1 kg 111.2 kg    Intake/Output:    Intake/Output Summary (Last 24 hours) at 08/06/2018 0827 Last data filed at 08/06/2018 0600 Gross per 24 hour  Intake 1480 ml  Output 2948 ml  Net -1468 ml     Physical Exam: General:  Critically ill-appearing, laying in the bed  HEENT  dry oral mucosa  Neck  tracheostomy in place  Pulm/lungs  ventilator assisted.  Scattered rhonchi  CVS/Heart  regular, no rub  Abdomen:   Soft, obese  Extremities:  No peripheral edema  Neurologic:  Did not respond to verbal or tactile stimuli  Skin:  Warm, dry  Access:  Left upper extremity developing new  AV fistula       Basic Metabolic Panel:  Recent Labs  Lab 08/02/18 0356  08/02/18 0522 08/02/18 1145 08/02/18 1728 08/02/18 2115 08/03/18 0504 08/04/18 0729 08/05/18 0538 08/06/18 0436  NA 144  --   --   --   --  140  --  145 149* 150*  K 3.1*  --   --   --   --  3.3*  --  3.4* 3.9 3.5  CL 108  --   --   --   --  106  --  111 116* 114*  CO2 23  --   --   --   --  23  --  24 26 25   GLUCOSE 244*  --   --   --   --  387*  --  283* 306* 261*  BUN 39*  --   --   --   --  48*  --  63* 78* 90*  CREATININE 3.78*  --   --   --   --  3.63*  --  3.44* 3.30* 3.08*  CALCIUM 7.8*  --   --   --   --  8.1*  --  7.9* 8.3* 8.4*  MG  --    < > 1.4* 2.5* 2.1  --  2.2 2.0  --  2.5*   PHOS  --   --  5.9* 5.5* 5.5*  --  3.5 3.7  --   --    < > = values in this interval not displayed.     CBC: Recent Labs  Lab 08/10/2018 1730 08/02/18 0356 08/03/18 0504 08/05/18 0538 08/06/18 0436  WBC 18.4* 12.1* 11.4* 8.3 8.9  NEUTROABS 16.9*  --   --   --  5.9  HGB 9.7* 8.1* 8.7* 9.2* 9.2*  HCT 32.1* 26.4* 28.2* 31.0* 31.2*  MCV 86.5 86.3 84.4 89.3 89.1  PLT 291 233 286 267 257     No results found for: HEPBSAG, HEPBSAB, HEPBIGM    Microbiology:  Recent Results (from the past 240 hour(s))  Urine Culture     Status: None   Collection  Time: 08/23/2018  5:32 PM  Result Value Ref Range Status   Specimen Description   Final    URINE, RANDOM Performed at [facility], [address], [place], [place] [address]    Special Requests   Final    NONE Performed at [facility], [address] [place], [place] [address]    Culture   Final    NO GROWTH Performed at [facility], [place] [address], [place], [place] [address]    Report Status 08/02/2018 FINAL  Final  Blood culture (routine x 2)     Status: None   Collection Time: 08/07/2018  7:10 PM  Result Value Ref Range Status   Specimen Description BLOOD RIGHT ANTECUBITAL  Final   Special Requests   Final    BOTTLES DRAWN AEROBIC AND ANAEROBIC Blood Culture adequate volume   Culture   Final    NO GROWTH 5 DAYS Performed at [facility], [address] [place], [place] [address]    Report Status 08/06/2018 FINAL  Final  Blood culture (routine x 2)     Status: None   Collection Time: 08/06/2018  7:10 PM  Result Value Ref Range Status   Specimen Description BLOOD BLOOD RIGHT FOREARM  Final   Special Requests   Final    BOTTLES DRAWN AEROBIC AND ANAEROBIC Blood Culture adequate volume   Culture   Final    NO GROWTH 5 DAYS Performed at [facility], [address], [place], [place] [address]    Report Status 08/06/2018 FINAL  Final  MRSA PCR Screening     Status: None    Collection Time: 08/19/2018  9:05 PM  Result Value Ref Range Status   MRSA by PCR NEGATIVE NEGATIVE Final    Comment:        The GeneXpert MRSA Assay (FDA approved for NASAL specimens only), is one component of a comprehensive MRSA colonization surveillance program. It is not intended to diagnose MRSA infection nor to guide or monitor treatment for MRSA infections. Performed at [facility], [address] [place], [place] [address]   Culture, respiratory (non-expectorated)     Status: None   Collection Time: 08/02/18  5:04 AM  Result Value Ref Range Status   Specimen Description   Final    TRACHEAL ASPIRATE Performed at [facility], [address] [place], [place] [address]    Special Requests   Final    NONE Performed at [facility], [place], [place] [address]    Gram Stain   Final    MODERATE SQUAMOUS EPITHELIAL CELLS PRESENT ABUNDANT WBC PRESENT, PREDOMINANTLY PMN FEW GRAM POSITIVE RODS RARE GRAM POSITIVE COCCI    Culture   Final    FEW Consistent with normal respiratory flora. Performed at [facility], [place] [address] [place], [place] [address]    Report Status 08/04/2018 FINAL  Final    Coagulation Studies: No results for input(s): LABPROT, INR in the last 72 hours.  Urinalysis: No results for input(s): COLORURINE, LABSPEC, PHURINE, GLUCOSEU, HGBUR, BILIRUBINUR, KETONESUR, PROTEINUR, UROBILINOGEN, NITRITE, LEUKOCYTESUR in the last 72 hours.  Invalid input(s): APPERANCEUR    Imaging: Ct Head Wo Contrast  Result Date: 08/04/2018 CLINICAL DATA:  Altered level of consciousness. EXAM: CT HEAD WITHOUT CONTRAST TECHNIQUE: Contiguous axial images were obtained from the base of the skull through the vertex without intravenous contrast. COMPARISON:  CT head 07/26/2018 FINDINGS: Brain: No evidence of acute infarction, hemorrhage,  hydrocephalus, extra-axial collection or mass lesion/mass effect. Vascular: Negative  for hyperdense vessel. Atherosclerotic calcification cavernous carotid bilaterally. Skull: Negative Sinuses/Orbits: Negative Other: None IMPRESSION: Negative CT head Electronically Signed   By: Franchot Gallo M.D.   On: 08/04/2018 11:20   Dg Chest Port 1 View  Result Date: 08/06/2018 CLINICAL DATA:  Shortness of Breath EXAM: PORTABLE CHEST 1 VIEW COMPARISON:  08/05/2018 FINDINGS: Cardiac shadow is at the upper limits of normal in size but stable. Tracheostomy tube and nasogastric catheter are again noted and stable. The lungs are well aerated with increasing bibasilar density likely related atelectasis. No sizable effusion is seen. Mild vascular congestion remains. IMPRESSION: Stable vascular congestion with increasing bibasilar atelectasis. Electronically Signed   By: Inez Catalina M.D.   On: 08/06/2018 08:21   Dg Chest Port 1 View  Result Date: 08/05/2018 CLINICAL DATA:  Acute respiratory failure EXAM: PORTABLE CHEST 1 VIEW COMPARISON:  08/04/2018 FINDINGS: Tracheostomy and NG tube are unchanged. Cardiomegaly. Mild vascular congestion and bibasilar opacities, likely atelectasis. No overt edema or effusions. No acute bony abnormality. IMPRESSION: Cardiomegaly with vascular congestion.  Mild bibasilar atelectasis. Electronically Signed   By: Rolm Baptise M.D.   On: 08/05/2018 08:54     Medications:   . feeding supplement (VITAL HIGH PROTEIN) 1,000 mL (08/05/18 1051)   . amLODipine  10 mg Per NG tube Daily  . carvedilol  25 mg Per Tube BID WC  . chlorhexidine gluconate (MEDLINE KIT)  15 mL Mouth Rinse BID  . famotidine  20 mg Per Tube Daily  . feeding supplement (PRO-STAT SUGAR FREE 64)  30 mL Per Tube TID  . free water  100 mL Per Tube Q4H  . heparin  5,000 Units Subcutaneous Q8H  . hydrALAZINE  75 mg Per Tube Q8H  . insulin aspart  0-15 Units Subcutaneous Q4H  . insulin aspart  4 Units Subcutaneous Q4H  . insulin glargine  22 Units Subcutaneous BID  . isosorbide dinitrate  30 mg Per Tube  BID  . levofloxacin  750 mg Per Tube Q48H  . lisinopril  30 mg Per Tube Daily  . mouth rinse  15 mL Mouth Rinse 10 times per day  . multivitamin  15 mL Per Tube Daily  . sodium chloride flush  3 mL Intravenous Q12H   acetaminophen **OR** acetaminophen, bisacodyl, fentaNYL (SUBLIMAZE) injection, hydrALAZINE, ipratropium-albuterol, [DISCONTINUED] ondansetron **OR** ondansetron (ZOFRAN) IV, sennosides  Assessment/ Plan:  57 y.o. 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  Acute renal failure on chronic kidney disease stage IV Baseline creatinine 3.13, GFR 21 from July 07, 2018 -Creatinine down to 3.0.  Urine output was 3.2 L over the preceding 24 hours.  Still hypernatremic which we will discuss below.  2.  Altered mental status Differential includes hypoglycemia, hypoxia, seizure Urine drug screen is positive for benzodiazepines -Responding only to painful stimuli.  3.  Diabetes type 2, insulin-dependent Poorly controlled Hemoglobin A1c 9.2% on 08/02/2018  4. Acute respiratory failure.  Continue ventilatory support at this time.  5.  Hypernatremia.  Blood sugar still high at 261.  Patient also remains hyponatremic with a serum sodium of 150.  Increase free water flushes 200 cc every 4 hours.   LOS: 5 Dontee Jaso 11/13/20198:27 AM  [place], [place]  Note: This note was prepared with  Sales executive. Any transcription errors are unintentional

## 2018-08-06 NOTE — Progress Notes (Signed)
Pt profile: 32 M with CKD, Htn, DM2, chronic trach tube adm 11/08 (2 days after LUE AVF placed) after being found unresponsive by family. Was initially hypoglycemic with seizure-like activity and posturing documented.  Did not improve with administration of dextrose.  CXR on admission worrisome for pneumonia versus pulmonary edema.  Tracheostomy tube changed out and mechanical ventilation initiated on day of admission.  08/06/2018: - -7900 Blood sugar is 210 slightly elevated range Blood pressure slightly elevated - Patient is more awake opening his eyes but still not following commands -Spoke with patient's wife suggested that patient has a chronic trach, patient had a complicated course after gallbladder surgery which resulted in trach however patient was completely mobile and functional before this episode -Also had a fever of 101 yesterday -No history of any diarrhea -Also has hyponatremia with sodium of 150   Lines, Tubes, etc: Chronic trach tube  Microbiology: MRSA PCR 11/08 >> NEG Urine 11/08 >> NEG Resp 11/09 >> NOF  Blood 11/08 >>   Blood: 08/06/2018 Sputum 08/06/2018  Antibiotics: Currently on Vanco plus Levaquin Anti-infectives (From admission, onward)   Start     Dose/Rate Route Frequency Ordered Stop   08/07/18 0800  vancomycin (VANCOCIN) IVPB 1000 mg/200 mL premix     1,000 mg 200 mL/hr over 60 Minutes Intravenous Every 18 hours 08/06/18 1515     08/06/18 1030  vancomycin (VANCOCIN) IVPB 1000 mg/200 mL premix     1,000 mg 200 mL/hr over 60 Minutes Intravenous  Once 08/06/18 1024 08/06/18 1444   08/04/18 1015  levofloxacin (LEVAQUIN) tablet 750 mg     750 mg Per Tube Every 48 hours 08/04/18 1012 08/12/18 1014   08/04/18 0321  vancomycin (VANCOCIN) 1,500 mg in sodium chloride 0.9 % 500 mL IVPB  Status:  Discontinued     1,500 mg 250 mL/hr over 120 Minutes Intravenous Every 48 hours 08/02/18 1415 08/04/18 0849   08/02/18 0600  aztreonam (AZACTAM) injection 1 g   Status:  Discontinued     1 g Intramuscular Every 8 hours 27-Aug-2018 2011 08/02/18 0448   08/02/18 0600  aztreonam (AZACTAM) 1 g in sodium chloride 0.9 % 100 mL IVPB  Status:  Discontinued     1 g 200 mL/hr over 30 Minutes Intravenous Every 8 hours 08/02/18 0448 08/04/18 1012   08/02/18 0400  vancomycin (VANCOCIN) 1,500 mg in sodium chloride 0.9 % 500 mL IVPB  Status:  Discontinued     1,500 mg 250 mL/hr over 120 Minutes Intravenous Every 24 hours 08-27-18 2011 08/02/18 1415   27-Aug-2018 1815  aztreonam (AZACTAM) 2 g in sodium chloride 0.9 % 100 mL IVPB     2 g 200 mL/hr over 30 Minutes Intravenous  Once 2018/08/27 1801 08/27/18 1946   08/27/18 1815  metroNIDAZOLE (FLAGYL) IVPB 500 mg  Status:  Discontinued     500 mg 100 mL/hr over 60 Minutes Intravenous Every 8 hours 08-27-18 1801 08/02/18 1502   08/27/18 1815  vancomycin (VANCOCIN) IVPB 1000 mg/200 mL premix     1,000 mg 200 mL/hr over 60 Minutes Intravenous  Once August 27, 2018 1801 2018/08/27 2120       Studies/Events: 11/08 CT head: No acute findings 11/09 EEG: 11/11 CT head: No acute findings 11/11 EEG:  Consults:  11/08 ENT 11/09 Neurology   Best Practice: DVT: SQ heparin SUP: enteral famotidine Nutrition: TF protocol (11/10) Glycemic control: SSI protocol (11/11) Sedation/analgesia: none required  Subj: RASS -4, -5.  Minimal movement of left lower extremity, currently placed  on trach collar doing well, mental status is not much improved -Blood pressure still elevated, hydralazine increase lisinopril increased - Blood sugar is mildly elevated Lantus increased -EEG showed metabolic encephalopathy  Obj: Vitals:   08/06/18 1300 08/06/18 1335  BP: (!) 167/65 (!) 160/75  Pulse: 74   Resp: (!) 21   Temp: 99.1 F (37.3 C)   SpO2: 97%    Gen: Minimally responsive, on mechanical ventilation via tracheostomy tube HEENT: NCAT, sclerae white Neck: Trach site clean, JVP not visualized Chest: No wheezes, scattered  rhonchi Cardiac: Regular, no M Abd: Obese, soft, no palpable masses, diminished BS Ext: Warm, symmetric edema Neuro: Grimaces to deep sternal rub.  Not following commands.  No spontaneous movement.  No withdrawal  BMP Latest Ref Rng & Units 08/06/2018 08/05/2018 08/04/2018  Glucose 70 - 99 mg/dL 161(W) 960(A) 540(J)  BUN 6 - 20 mg/dL 81(X) 91(Y) 78(G)  Creatinine 0.61 - 1.24 mg/dL 9.56(O) 1.30(Q) 6.57(Q)  BUN/Creat Ratio 9 - 20 - - -  Sodium 135 - 145 mmol/L 150(H) 149(H) 145  Potassium 3.5 - 5.1 mmol/L 3.5 3.9 3.4(L)  Chloride 98 - 111 mmol/L 114(H) 116(H) 111  CO2 22 - 32 mmol/L 25 26 24   Calcium 8.9 - 10.3 mg/dL 4.6(N) 8.3(L) 7.9(L)      CBC Latest Ref Rng & Units 08/06/2018 08/05/2018 08/03/2018  WBC 4.0 - 10.5 K/uL 8.9 8.3 11.4(H)  Hemoglobin 13.0 - 17.0 g/dL 6.2(X) 5.2(W) 4.1(L)  Hematocrit 39.0 - 52.0 % 31.2(L) 31.0(L) 28.2(L)  Platelets 150 - 400 K/uL 257 267 286      CXR: Cardiomegaly, RLL atelectasis versus infiltrate   IMPRESSION: 1) chronic tracheostomy tube 2) history of COPD 3) history of obstructive sleep apnea 4) history of hypertension 5) CKD, nonoliguric.  Creatinine approximately at baseline 6) acute on chronic hypoxemic respiratory failure 7) suspected pneumonia 8) pulmonary edema 9) hypertensive emergency, blood pressure still elevated will increase Coreg to 37.5 10) hypoglycemia, resolved 11) prolonged encephalopathy/obtundation PLAN:  CVS: Blood pressure still elevated, increase hydralazine to 75 every 8, increase lisinopril to 30 daily, continue Norvasc 10, increase Coreg to 37.5, echo showed EF of 55 to 60% RS: Patient was placed on trach collar/high flow with trach and overall doing well - We will continue with that, ABG was checked and it was essentially okay - The patient does well continue with her trach collar with high flow if needed will consider pressure support with PEEP at nighttime -- Aspiration precaution -- Vent protocol on vented  patient  ID: Was treated with Vanco plus Azactam which was switched to Levaquin on 08/04/2018, all the cultures are negative however patient had a fever spike 11/12 --Repeat cultures were done - We will add vancomycin back, continue with the Levaquin, if continue to have fever will consider switching to meropenem - Will order DVT study to look for noninfectious causes of fever ENDO: Blood sugars are slightly elevated will adjust Lantus dose -- SSI monitor blood sugar GI: Tube feeding as tolerated currently on Protonix RENAL: Renal consult appreciated currently not started on hemodialysis sodium is 150 creatinine is 3.03-improved with Lasix with potassium 3.5, will hold Lasix for today, will start patient on D5 water for elevated sodium along with free water flushes -- Electrolyte replacement protocol, Monitor Cr and K  CNS: EEG reviewed, shows diffuse metabolic encephalopathy, not responding much unsure whether this is a baseline, does have a trach for respiratory toileting so we can consider doing trach collar and evaluate further,EEG and  head CTs are okay, neurology planning to do MRI  hEMATOLOGY: No acute issue-as per the family patient had some anemia and was getting worked up -- Monitor HB and PLT MUSCULOSKELETAL: No acute issue PAIN AND SEDATION: Currently not on any medication use PRN meds  Skin/Wound: Chronic changes  Electrolytes: Replace electrolytes per ICU electrolyte replacement protocol.   IVF: D5 water 30  Nutrition: Tube feeds as tolerated  Prophylaxis: DVT Prophylaxis with heparin,. GI Prophylaxis.   Restraints: None  PT/OT eval and treat. OOB when appropriate.   Lines/Tubes: From 08/16/2018 Foley Got Midline RUE on 08/06/18 ADVANCE DIRECTIVE: Full code needs to address CODE STATUS with the patient's family. FAMILY DISCUSSION: Spoke with patient's 5 discussed patient's care at length Quality Care: PPI, DVT prophylaxis, HOB elevated, Infection control all reviewed and  addressed. Events and notes from last 24 hours reviewed. Care plan discussed on multidisciplinary rounds CC TIME: 2340     Old records reviewed discussed results and management plan with patient  Images personally reviewed and results and labs reviewed and discussed with patient.  All medication reviewed and adjusted  Further management depending on test results and work up as outlined above.   Roseanne Renoutul Hortence Charter, M.D

## 2018-08-06 NOTE — Progress Notes (Signed)
Nutrition Follow-up  DOCUMENTATION CODES:   Obesity unspecified  INTERVENTION:  Continue Vital High Protein at 50 mL/hr (1200 mL goal daily volume) + Pro-Stat 30 mL TID via NGT. Provides 1500 kcal, 150 grams of protein, 1008 mL H2O daily.  Continue liquid MVI daily per tube as goal TF regimen does not meet 100% RDIs for vitamins/minerals.  With free water flush of 200 mL Q4hrs patient is receiving a total of 2208 mL H2O daily including water in tube feeding.  NUTRITION DIAGNOSIS:   Inadequate oral intake related to inability to eat as evidenced by NPO status.  Ongoing - addressing with TF regimen.  GOAL:   Provide needs based on ASPEN/SCCM guidelines  Met with TF regimen.  MONITOR:   Vent status, Labs, Weight trends, TF tolerance, I & O's  REASON FOR ASSESSMENT:   Ventilator, Consult Enteral/tube feeding initiation and management  ASSESSMENT:   57 year old male with PMHx of HTN, HLD, DM type 2, sleep apnea, COPD, CKD stage IV, GERD, depression, hx tracheostomy due to stenosis of larynx who is admitted with AMS, PNA, respiratory failure requiring intubation on 11/8, hypertensive emergency.  Patient remains ventilated through tracheostomy tube. Not requiring any sedation. On PSV with FiO2 35% and PEEP 5 cmH2O. Abdomen remains soft. Patient had a large type 6 BM yesterday. Tolerating tube feeds well. Patient has been poorly responsive. Plan is for MRI of brain today. Free water flush was increased to 200 mL Q4hrs today by Nephrology to address hypernatremia.  Enteral Access: 16 Fr. NGT placed 11/9; terminates in body of stomach per abdominal x-ray 11/9; 60 cm at left nare  MAP: 79-124 mmHg  TF: pt tolerating Vital High Protein at 50 mL/hr  Patient is currently intubated on ventilator support Ve: 9.9 L/min Temp (24hrs), Avg:99.7 F (37.6 C), Min:98.1 F (36.7 C), Max:101.3 F (38.5 C)  Propofol: N/A  Medications reviewed and include: famotidine 20 mg daily per tube,  free water flush 200 mL Q4hrs per tube, Novolog 0-15 units Q4hrs, Novolog 4 units Q4hrs, Lantus 24 units BID, Levaquin, lisinopril, liquid MVI daily per tube, D5W at 30 mL/hr (36 grams dextrose, 122.4 kcal daily), vancomycin.  Labs reviewed: CBG 197-293 past 24 hrs, Sodium 150 (trending up), Chloride 114 (trending down), BUN 90 (trending up), Creatinine 3.08 (trending down).  I/O: 2948 mL UOP yesterday (1.1 mL/kg/hr); 1 BM yesterday  Weight trend: 111.2 kg on 11/13; trending down; -7.3 kg from admission with diuresis  Discussed with RN and on rounds.  Diet Order:   Diet Order    None     EDUCATION NEEDS:   No education needs have been identified at this time  Skin:  Skin Assessment: Reviewed RN Assessment(ecchymosis)  Last BM:  08/05/2018 - large type 6  Height:   Ht Readings from Last 1 Encounters:  08/03/2018 _0  (1.727 m)   Weight:   Wt Readings from Last 1 Encounters:  08/06/18 111.2 kg   Ideal Body Weight:  70 kg  BMI:  Body mass index is 37.28 kg/m.  Estimated Nutritional Needs:   Kcal:  9562-1308 (11-14 kcal/kg)  Protein:  140 grams (2 grams/kg IBW)  Fluid:  1.75-2.1 L/day (25-30 mL/kg IBW)  Willey Blade, MS, RD, LDN Office: [phone] Pager: [phone] After Hours/Weekend Pager: [phone]

## 2018-08-06 NOTE — Progress Notes (Signed)
Daily Progress Note   Patient Name: Matthew Mayer       Date: 08/06/2018 DOB: August 09, 1961  Age: 57 y.o. MRN#: [id number] Attending Physician: Vaughan Basta, * Primary Care Physician: Sofie Hartigan, MD Admit Date: 08/06/2018  Reason for Consultation/Follow-up: Establishing goals of care  Subjective: Patient's eyes are open this afternoon. He will not follow commands but resistant to pupil assessment and oral care. Maintaining oxygen saturation on trach collar. No s/s of discomfort or pain.   GOC:  Wife, Rosa at bedside. Again discussed diagnoses, interventions, labs. Discussed plan for MRI today.   Further explored whether the patient has completed advanced directives. Basilia Jumbo tells me they recently completed in May but have not yet been given a copy from their attorney. She shares that he felt it was time to complete paperwork. I asked Rosa his thoughts on aggressive/heroic interventions if terminally ill. Rosa states "he definitely did not want a DNR." He did share with her that he would NOT want prolonged feeding tube if unable to ambulate and have quality of life. She again recalls his prolonged hospitalization and recovery after gb surgery, and feeling thankful and blessed that he was able to return home and with good quality of life. She shares that a few years ago, he was on ECMO and continuous dialysis at [facility] and was able to recovery from that event. He is a Management consultant." We joked about him having many lives. Rosa remains hopeful that he just needs more time to recovery.   Answered all questions. Rosa appreciative of palliative follow-up.   Length of Stay: 5  Current Medications: Scheduled Meds:  . amLODipine  10 mg Per NG tube Daily  . carvedilol  37.5 mg Per Tube BID WC  .  chlorhexidine gluconate (MEDLINE KIT)  15 mL Mouth Rinse BID  . famotidine  20 mg Per Tube Daily  . feeding supplement (PRO-STAT SUGAR FREE 64)  30 mL Per Tube TID  . free water  200 mL Per Tube Q4H  . heparin  5,000 Units Subcutaneous Q8H  . hydrALAZINE  75 mg Per Tube Q8H  . insulin aspart  0-15 Units Subcutaneous Q4H  . insulin aspart  4 Units Subcutaneous Q4H  . insulin glargine  24 Units Subcutaneous BID  . isosorbide dinitrate  30 mg Per Tube BID  .  levofloxacin  750 mg Per Tube Q48H  . [START ON 08/07/2018] lisinopril  40 mg Per Tube Daily  . mouth rinse  15 mL Mouth Rinse 10 times per day  . multivitamin  15 mL Per Tube Daily  . sodium chloride flush  3 mL Intravenous Q12H    Continuous Infusions: . dextrose 30 mL/hr (08/06/18 1102)  . feeding supplement (VITAL HIGH PROTEIN) 1,000 mL (08/05/18 1051)  . [START ON 08/07/2018] vancomycin      PRN Meds: acetaminophen **OR** acetaminophen, bisacodyl, fentaNYL (SUBLIMAZE) injection, hydrALAZINE, ipratropium-albuterol, [DISCONTINUED] ondansetron **OR** ondansetron (ZOFRAN) IV, sennosides, sodium chloride flush  Physical Exam  Constitutional: He appears lethargic. He appears ill.  HENT:  Head: Normocephalic and atraumatic.  Cardiovascular: Regular rhythm.  Pulmonary/Chest: No accessory muscle usage. No tachypnea. No respiratory distress.  Trach collar  Abdominal: There is no tenderness.  Neurological: He appears lethargic.  Eyes open. Will not follow commands. Does respond to oral care/resists pupil assessment. PERRLA.  Skin: Skin is warm and dry. There is pallor.  Psychiatric: Cognition and memory are impaired. He is noncommunicative. He is inattentive.  Nursing note and vitals reviewed.          Vital Signs: BP (!) 164/68   Pulse 74   Temp 99.1 F (37.3 C)   Resp (!) 21   Ht 5' 8"  (1.727 m)   Wt 111.2 kg   SpO2 97%   BMI 37.28 kg/m  SpO2: SpO2: 97 % O2 Device: O2 Device: Ventilator O2 Flow Rate: O2 Flow Rate  (L/min): 45 L/min  Intake/output summary:   Intake/Output Summary (Last 24 hours) at 08/06/2018 1557 Last data filed at 08/06/2018 1416 Gross per 24 hour  Intake 1143 ml  Output 2898 ml  Net -1755 ml   LBM: Last BM Date: 08/05/18 Baseline Weight: Weight: 117 kg Most recent weight: Weight: 111.2 kg       Palliative Assessment/Data: PPS 20%   Flowsheet Rows     Most Recent Value  Intake Tab  Referral Department  Critical care  Unit at Time of Referral  ICU  Palliative Care Primary Diagnosis  Neurology  Palliative Care Type  New Palliative care  Reason for referral  Clarify Goals of Care  Date first seen by Palliative Care  08/05/18  Clinical Assessment  Palliative Performance Scale Score  20%  Psychosocial & Spiritual Assessment  Palliative Care Outcomes  Patient/Family meeting held?  Yes  Who was at the meeting?  wife, aunt  Palliative Care Outcomes  Clarified goals of care, ACP counseling assistance, Provided psychosocial or spiritual support      Patient Active Problem List   Diagnosis Date Noted  . Palliative care by specialist   . Goals of care, counseling/discussion   . Hypoglycemia   . Acute respiratory failure ([place])   . Altered mental status   . Hypertensive urgency   . Acute encephalopathy   . Sepsis ([place]) 08/14/2018  . Iron deficiency anemia 07/24/2018  . Hypertriglyceridemia 01/27/2017  . Acute renal failure superimposed on stage 4 chronic kidney disease ([place]) 07/07/2016  . Chronic respiratory failure with hypoxia and hypercapnia ([place]) 07/07/2016  . Acute pancreatitis 06/23/2016  . Stage 4 chronic kidney disease ([place]) 04/06/2015  . Chronic diastolic CHF (congestive heart failure), NYHA class 3 ([place]) 02/14/2015  . Benign essential hypertension 02/07/2015  . Depression 10/27/2014  . Type 2 diabetes mellitus with diabetic chronic kidney disease ([place]) 07/29/2014  . Chronic low back pain 07/23/2014  . Exertional chest pain 07/23/2014  .  Insulin  resistance 05/13/2014  . Long-term insulin use ([place]) 05/13/2014  . Bilateral edema of lower extremity 04/05/2014  . 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 GERD (gastroesophageal reflux disease) 11/27/2013  . Obesity, unspecified 11/27/2013  . Tracheostomy in place [facility]) 11/27/2013  . Vocal cord paralysis 08/12/2013  . COPD (chronic obstructive pulmonary disease) ([place]) 07/28/2013  . Tracheostomy status ([place]) 07/28/2013  . Nocturnal hypoxemia 07/28/2013  . HCAP (healthcare-associated pneumonia) 07/28/2013    Palliative Care Assessment & Plan   Patient Profile: 57 y.o. male  with past medical history of OSA, COPD, chronic trach, HTN, HLD, GERD, DM, CKD stage IV, depression, anemia, CHF, recent AV fistula placement admitted on 08/17/2018 with altered mental status. Patient was found unresponsive at home by wife. When EMS arrived, blood sugar was 45 and spouse reported seizure-like activity. Posturing noted by EMS. Oxygen was found to be 70's with trach collar off. In ED, CT head negative for acute findings. Chest xray revealed bilateral pulmonary infiltrates concern for pneumonia versus pulmonary edema. Trach exchanged with cuff and patient placed on full ventilator support. Continues to be unresponsive. Neurology following. Repeat CT negative for acute findings. EEG abnormal secondary to background slowing, possible metabolic encephalopathy. Palliative medicine consultation for goals of care.   Assessment: Acute on chronic respiratory failure Chronic tracheostomy Hx COPD Hx OSA Encephalopathy Suspected pneumonia Pulmonary edema Hypoglycemia resolved Hypernatremia CKD  Recommendations/Plan:  FULL code/FULL scope treatment per patient wishes. Wife shares that they recently completed advanced directives outpatient and he "definitely did not want a DNR." [employer] copy given for review.   MRI this afternoon.   PMT will continue to follow and support patient/family. Wife hopeful for  improvement and believes he requires more time to recover with multiple underlying co-morbidities.   Goals of Care and Additional Recommendations:  Limitations on Scope of Treatment: Full Scope Treatment  Code Status: FULL   Code Status Orders  (From admission, onward)         Start     Ordered   08/04/2018 1918  Full code  Continuous     07/30/2018 1918        Code Status History    This patient has a current code status but no historical code status.    Advance Directive Documentation     Most Recent Value  Type of Advance Directive  Healthcare Power of Attorney, Living will  Pre-existing out of facility DNR order (yellow form or pink MOST form)  -  "MOST" Form in Place?  -       Prognosis:   Unable to determine  Discharge Planning:  To Be Determined  Care plan was discussed with RN, Dr Manuella Ghazi, wife  Thank you for allowing the Palliative Medicine Team to assist in the care of this patient.   Time In: 1510 Time Out: 1545 Total Time 81mn Prolonged Time Billed no      Greater than 50%  of this time was spent counseling and coordinating care related to the above assessment and plan.  MIhor Dow FNP-C Palliative Medicine Team  Phone: [phone]: [phone] Please contact Palliative Medicine Team phone at [phone] questions and concerns.

## 2018-08-06 NOTE — Progress Notes (Signed)
Inpatient Diabetes Program Recommendations  AACE/ADA: New Consensus Statement on Inpatient Glycemic Control (2019)  Target Ranges:  Prepandial:   less than 140 mg/dL      Peak postprandial:   less than 180 mg/dL (1-2 hours)      Critically ill patients:  140 - 180 mg/dL  Results for Matthew OharaERRELL, Naksh W (MRN [id number]) as of 08/06/2018 08:14  Ref. Range 08/05/2018 08:03 08/05/2018 11:51 08/05/2018 16:28 08/05/2018 19:32 08/05/2018 23:18 08/06/2018 04:07  Glucose-Capillary Latest Ref Range: 70 - 99 mg/dL 409188 (H) 811171 (H) 914280 (H) 241 (H) 197 (H) 214 (H)    Review of Glycemic Control Diabetes history: DM2 Outpatient Diabetes medications: Humulin R U500 125 units with meals Current orders for Inpatient glycemic control: Lantus 22 units BID, Novolog 0-15 units Q4H, Novolog 4 units Q4H for tube feeding coverage  Inpatient Diabetes Program Recommendations:   Insulin - Basal: Please consider increasing Lantus to 25 units BID.  Insulin - Tube Feeding Coverage: Please consider increasing tube feeding coverage to Novolog 6 units Q4H.  If tube feeding is stopped or held then Novolog tube feeding coverage should also be stopped or held.  Thanks, Orlando PennerMarie Lailani Tool, RN, MSN, CDE Diabetes Coordinator Inpatient Diabetes Program [phone] (Team Pager from 8am to 5pm)

## 2018-08-06 NOTE — Progress Notes (Signed)
Subjective: Patient remains poorly responsive but does resist exam more than previously.    Objective: Current vital signs: BP 128/62   Pulse 76   Temp 99.1 F (37.3 C)   Resp (!) 22   Ht _0  (1.727 m)   Wt 111.2 kg   SpO2 95%   BMI 37.28 kg/m  Vital signs in last 24 hours: Temp:  [98.1 F (36.7 C)-101.3 F (38.5 C)] 99.1 F (37.3 C) (11/13 1200) Pulse Rate:  [66-92] 76 (11/13 1200) Resp:  [0-35] 22 (11/13 1200) BP: (106-179)/(53-114) 128/62 (11/13 1100) SpO2:  [90 %-99 %] 95 % (11/13 1200) FiO2 (%):  [35 %-42 %] 35 % (11/13 0802) Weight:  [111.2 kg] 111.2 kg (11/13 0500)  Intake/Output from previous day: 11/12 0701 - 11/13 0700 In: 1810 [NG/GT:1810] Out: 2948 [Urine:2948] Intake/Output this shift: Total I/O In: 83 [I.V.:3; NG/GT:80] Out: 535 [Urine:535] Nutritional status:  Diet Order    None      Neurologic Exam: Mental Status: Patient does not respond to verbal stimuli.Mild grimaceto deep sternal rub. Does not follow commands. No verbalizations noted.  Cranial Nerves: II: patient does not respond confrontation bilaterally, pupils right56m, left 368mand reactivebilaterally III,IV,VI: doll's responsepresent bilaterally.  Resists head movement V,VII: corneal reflexpresentbilaterally VIII: patient does not respond to verbal stimuli IX,X: gag reflexreduced, XI: trapezius strength unable to test bilaterally XII: tongue strength unable to test Motor: Extremities flaccid throughout. Spontaneous movement noted of the lower extremities. Sensory: Does not respond to noxious stimuli in any extremity.  Lab Results: Basic Metabolic Panel: Recent Labs  Lab 08/02/18 0356  08/02/18 0522 08/02/18 1145 08/02/18 1728 08/02/18 2115 08/03/18 0504 08/04/18 0729 08/05/18 0538 08/06/18 0436  NA 144  --   --   --   --  140  --  145 149* 150*  K 3.1*  --   --   --   --  3.3*  --  3.4* 3.9 3.5  CL 108  --   --   --   --  106  --  111 116* 114*  CO2 23   --   --   --   --  23  --  _1 GLUCOSE 244*  --   --   --   --  387*  --  283* 306* 261*  BUN 39*  --   --   --   --  48*  --  63* 78* 90*  CREATININE 3.78*  --   --   --   --  3.63*  --  3.44* 3.30* 3.08*  CALCIUM 7.8*  --   --   --   --  8.1*  --  7.9* 8.3* 8.4*  MG  --    < > 1.4* 2.5* 2.1  --  2.2 2.0  --  2.5*  PHOS  --   --  5.9* 5.5* 5.5*  --  3.5 3.7  --   --    < > = values in this interval not displayed.    Liver Function Tests: Recent Labs  Lab 08/12/2018 1730 08/02/18 0356 08/04/18 0729 08/05/18 0538  AST 30 22  --  39  ALT 17 16  --  16  ALKPHOS 124 99  --  121  BILITOT 0.7 1.0  --  0.7  PROT 7.7 6.8  --  6.9  ALBUMIN 2.5* 2.0* 2.0* 2.1*   Recent Labs  Lab 08/18/2018 1730  LIPASE 35   Recent Labs  Lab 08/09/2018 1730  AMMONIA 19    CBC: Recent Labs  Lab 08/10/2018 1730 08/02/18 0356 08/03/18 0504 08/05/18 0538 08/06/18 0436  WBC 18.4* 12.1* 11.4* 8.3 8.9  NEUTROABS 16.9*  --   --   --  5.9  HGB 9.7* 8.1* 8.7* 9.2* 9.2*  HCT 32.1* 26.4* 28.2* 31.0* 31.2*  MCV 86.5 86.3 84.4 89.3 89.1  PLT 291 233 286 267 257    Cardiac Enzymes: Recent Labs  Lab 08/17/2018 1730 08/02/18 0522  TROPONINI 0.03* 0.06*    Lipid Panel: No results for input(s): CHOL, TRIG, HDL, CHOLHDL, VLDL, LDLCALC in the last 168 hours.  CBG: Recent Labs  Lab 08/05/18 1932 08/05/18 2318 08/06/18 0407 08/06/18 0800 08/06/18 1107  GLUCAP 241* 197* 214* 210* 293*    Microbiology: Results for orders placed or performed during the hospital encounter of 07/28/2018  Urine Culture     Status: None   Collection Time: 08/20/2018  5:32 PM  Result Value Ref Range Status   Specimen Description   Final    URINE, RANDOM Performed at [facility], [address], [place], [place] [address]    Special Requests   Final    NONE Performed at [facility], [address] [place], [place] [address]    Culture   Final    NO GROWTH Performed at [place], [place] [address], [place], [place] [address]    Report Status 08/02/2018 FINAL  Final  Blood culture (routine x 2)     Status: None   Collection Time: 07/26/2018  7:10 PM  Result Value Ref Range Status   Specimen Description BLOOD RIGHT ANTECUBITAL  Final   Special Requests   Final    BOTTLES DRAWN AEROBIC AND ANAEROBIC Blood Culture adequate volume   Culture   Final    NO GROWTH 5 DAYS Performed at [facility], [address] [place], [place] [address]    Report Status 08/06/2018 FINAL  Final  Blood culture (routine x 2)     Status: None   Collection Time: 08/17/2018  7:10 PM  Result Value Ref Range Status   Specimen Description BLOOD BLOOD RIGHT FOREARM  Final   Special Requests   Final    BOTTLES DRAWN AEROBIC AND ANAEROBIC Blood Culture adequate volume   Culture   Final    NO GROWTH 5 DAYS Performed at [facility], [address], [place], [place] [address]    Report Status 08/06/2018 FINAL  Final  MRSA PCR Screening     Status: None   Collection Time: 07/31/2018  9:05 PM  Result Value Ref Range Status   MRSA by PCR NEGATIVE NEGATIVE Final    Comment:        The GeneXpert MRSA Assay (FDA approved for NASAL specimens only), is one component of a comprehensive MRSA colonization surveillance program. It is not intended to diagnose MRSA infection nor to guide or monitor treatment for MRSA infections. Performed at [facility], [address] [place], [place] [address]   Culture, respiratory (non-expectorated)     Status: None   Collection Time: 08/02/18  5:04 AM  Result Value Ref Range Status   Specimen Description   Final    TRACHEAL ASPIRATE Performed at [facility], [address], [place], [place] [address]    Special Requests   Final    NONE Performed at [facility], [address], [place], [place] [address]    Gram  Stain   Final    MODERATE SQUAMOUS EPITHELIAL CELLS PRESENT ABUNDANT WBC PRESENT, PREDOMINANTLY  PMN FEW GRAM POSITIVE RODS RARE GRAM POSITIVE COCCI    Culture   Final    FEW Consistent with normal respiratory flora. Performed at [facility], [place] [address] [place], [place] [address]    Report Status 08/04/2018 FINAL  Final    Coagulation Studies: No results for input(s): LABPROT, INR in the last 72 hours.  Imaging: Dg Chest Port 1 View  Result Date: 08/06/2018 CLINICAL DATA:  Shortness of Breath EXAM: PORTABLE CHEST 1 VIEW COMPARISON:  08/05/2018 FINDINGS: Cardiac shadow is at the upper limits of normal in size but stable. Tracheostomy tube and nasogastric catheter are again noted and stable. The lungs are well aerated with increasing bibasilar density likely related atelectasis. No sizable effusion is seen. Mild vascular congestion remains. IMPRESSION: Stable vascular congestion with increasing bibasilar atelectasis. Electronically Signed   By: Inez Catalina M.D.   On: 08/06/2018 08:21   Dg Chest Port 1 View  Result Date: 08/05/2018 CLINICAL DATA:  Acute respiratory failure EXAM: PORTABLE CHEST 1 VIEW COMPARISON:  08/04/2018 FINDINGS: Tracheostomy and NG tube are unchanged. Cardiomegaly. Mild vascular congestion and bibasilar opacities, likely atelectasis. No overt edema or effusions. No acute bony abnormality. IMPRESSION: Cardiomegaly with vascular congestion.  Mild bibasilar atelectasis. Electronically Signed   By: Rolm Baptise M.D.   On: 08/05/2018 08:54    Medications:  I have reviewed the patient's current medications. Scheduled: . amLODipine  10 mg Per NG tube Daily  . carvedilol  37.5 mg Per Tube BID WC  . chlorhexidine gluconate (MEDLINE KIT)  15 mL Mouth Rinse BID  . famotidine  20 mg Per Tube Daily  . feeding supplement (PRO-STAT SUGAR FREE 64)  30 mL Per Tube TID  . free water  200 mL Per Tube Q4H  . heparin  5,000 Units Subcutaneous Q8H  . hydrALAZINE  75 mg Per Tube Q8H  . insulin aspart  0-15 Units Subcutaneous Q4H  . insulin aspart  4 Units  Subcutaneous Q4H  . insulin glargine  24 Units Subcutaneous BID  . isosorbide dinitrate  30 mg Per Tube BID  . levofloxacin  750 mg Per Tube Q48H  . lisinopril  30 mg Per Tube Daily  . mouth rinse  15 mL Mouth Rinse 10 times per day  . multivitamin  15 mL Per Tube Daily  . sodium chloride flush  3 mL Intravenous Q12H    Assessment/Plan: Patient more resistant to exam today but remains poorly responsive.  Work up to date unrevealing.    Recommendations: 1.  MRI of the brain without contrast   LOS: 5 days   Alexis Goodell, MD Neurology [phone] 08/06/2018  12:48 PM

## 2018-08-07 ENCOUNTER — Ambulatory Visit: Payer: BLUE CROSS/BLUE SHIELD

## 2018-08-07 ENCOUNTER — Inpatient Hospital Stay: Payer: BLUE CROSS/BLUE SHIELD

## 2018-08-07 LAB — MAGNESIUM: Magnesium: 2.2 mg/dL (ref 1.7–2.4)

## 2018-08-07 LAB — COMPREHENSIVE METABOLIC PANEL
ALT: 29 U/L (ref 0–44)
ANION GAP: 6 (ref 5–15)
AST: 54 U/L — ABNORMAL HIGH (ref 15–41)
Albumin: 2.2 g/dL — ABNORMAL LOW (ref 3.5–5.0)
Alkaline Phosphatase: 159 U/L — ABNORMAL HIGH (ref 38–126)
BUN: 88 mg/dL — ABNORMAL HIGH (ref 6–20)
CHLORIDE: 114 mmol/L — AB (ref 98–111)
CO2: 28 mmol/L (ref 22–32)
Calcium: 8.4 mg/dL — ABNORMAL LOW (ref 8.9–10.3)
Creatinine, Ser: 3 mg/dL — ABNORMAL HIGH (ref 0.61–1.24)
GFR calc Af Amer: 25 mL/min — ABNORMAL LOW (ref >60)
GFR calc non Af Amer: 22 mL/min — ABNORMAL LOW (ref >60)
GLUCOSE: 242 mg/dL — AB (ref 70–99)
POTASSIUM: 3.4 mmol/L — AB (ref 3.5–5.1)
SODIUM: 148 mmol/L — AB (ref 135–145)
Total Bilirubin: 0.6 mg/dL (ref 0.3–1.2)
Total Protein: 6.6 g/dL (ref 6.5–8.1)

## 2018-08-07 LAB — GLUCOSE, CAPILLARY
GLUCOSE-CAPILLARY: 166 mg/dL — AB (ref 70–99)
GLUCOSE-CAPILLARY: 188 mg/dL — AB (ref 70–99)
GLUCOSE-CAPILLARY: 208 mg/dL — AB (ref 70–99)
GLUCOSE-CAPILLARY: 218 mg/dL — AB (ref 70–99)
Glucose-Capillary: 192 mg/dL — ABNORMAL HIGH (ref 70–99)
Glucose-Capillary: 194 mg/dL — ABNORMAL HIGH (ref 70–99)
Glucose-Capillary: 189 mg/dL — ABNORMAL HIGH (ref 70–99)

## 2018-08-07 LAB — CBC WITH DIFFERENTIAL/PLATELET
Abs Immature Granulocytes: 0.06 10*3/uL (ref 0.00–0.07)
BASOS ABS: 0.1 10*3/uL (ref 0.0–0.1)
Basophils Relative: 0 %
EOS PCT: 3 %
Eosinophils Absolute: 0.4 10*3/uL (ref 0.0–0.5)
HCT: 31.6 % — ABNORMAL LOW (ref 39.0–52.0)
Hemoglobin: 9.3 g/dL — ABNORMAL LOW (ref 13.0–17.0)
Immature Granulocytes: 1 %
Lymphocytes Relative: 20 %
Lymphs Abs: 2.3 10*3/uL (ref 0.7–4.0)
MCH: 26.5 pg (ref 26.0–34.0)
MCHC: 29.4 g/dL — AB (ref 30.0–36.0)
MCV: 90 fL (ref 80.0–100.0)
Monocytes Absolute: 1 10*3/uL (ref 0.1–1.0)
Monocytes Relative: 8 %
NRBC: 0 % (ref 0.0–0.2)
Neutro Abs: 7.7 10*3/uL (ref 1.7–7.7)
Neutrophils Relative %: 68 %
Platelets: 263 10*3/uL (ref 150–400)
RBC: 3.51 MIL/uL — AB (ref 4.22–5.81)
RDW: 16.6 % — AB (ref 11.5–15.5)
WBC: 11.4 10*3/uL — AB (ref 4.0–10.5)

## 2018-08-07 LAB — PHOSPHORUS: Phosphorus: 3 mg/dL (ref 2.5–4.6)

## 2018-08-07 LAB — POTASSIUM: POTASSIUM: 3.5 mmol/L (ref 3.5–5.1)

## 2018-08-07 MED ORDER — POTASSIUM CHLORIDE 20 MEQ PO PACK
40.0000 meq | PACK | Freq: Once | ORAL | Status: AC
  Administered 2018-08-07: 40 meq via ORAL
  Filled 2018-08-07: qty 2

## 2018-08-07 MED ORDER — POTASSIUM CHLORIDE 20 MEQ PO PACK
20.0000 meq | PACK | Freq: Once | ORAL | Status: AC
  Administered 2018-08-07: 20 meq
  Filled 2018-08-07: qty 1

## 2018-08-07 NOTE — Progress Notes (Signed)
Daily Progress Note   Patient Name: Matthew Mayer       Date: 08/07/2018 DOB: 1961-07-10  Age: 57 y.o. MRN#: [id number] Attending Physician: Vaughan Basta, * Primary Care Physician: Sofie Hartigan, MD Admit Date: 07/26/2018  Reason for Consultation/Follow-up: Establishing goals of care  Subjective: Patient's eyes are open this afternoon. He will not follow commands but resistant to pupil assessment and oral care. Non-purposeful movement of upper extremities.  Maintaining oxygen saturation on trach collar. No s/s of discomfort or pain.   GOC:  Spoke with Dr. Doy Mince regarding MRI results prior to visit with family this afternoon.   F/u with wife and aunt at bedside. Discussed MRI results and reviewed neurology note. Again discussed course of hospital diagnoses and interventions. Wife very tearful throughout conversation and shares that he would not want to live prolonged with feeding tube. After admission at [facility], he passed swallow evaluation within a month and eventually feeding tube was discontinued. Rosa asks where he will go from this hospitalization. Explained he will need higher level of care likely at a nursing facility. Explained that if she desires further aggressive interventions, he will likely need PEG tube placement since unable to participate with therapy/SLP.   Wife processing MRI results and the fact that he may not cognitively recover to pre-hospital baseline. She shares her strong faith that the Reita Cliche is in control and praying Henson will wake up.   I gave [employer] booklet and encouraged her to read. She understands she is faced with big decisions regarding plan of care/feeding tube placement.    Length of Stay: 6  Current Medications: Scheduled  Meds:  . amLODipine  10 mg Per NG tube Daily  . carvedilol  37.5 mg Per Tube BID WC  . chlorhexidine gluconate (MEDLINE KIT)  15 mL Mouth Rinse BID  . famotidine  20 mg Per Tube Daily  . feeding supplement (PRO-STAT SUGAR FREE 64)  30 mL Per Tube TID  . free water  200 mL Per Tube Q4H  . heparin  5,000 Units Subcutaneous Q8H  . hydrALAZINE  75 mg Per Tube Q8H  . insulin aspart  0-15 Units Subcutaneous Q4H  . insulin aspart  4 Units Subcutaneous Q4H  . insulin glargine  24 Units Subcutaneous BID  . isosorbide dinitrate  30 mg  Per Tube BID  . levofloxacin  750 mg Per Tube Q48H  . lisinopril  40 mg Per Tube Daily  . mouth rinse  15 mL Mouth Rinse 10 times per day  . multivitamin  15 mL Per Tube Daily  . sodium chloride flush  3 mL Intravenous Q12H    Continuous Infusions: . dextrose 30 mL/hr at 08/07/18 1303  . feeding supplement (VITAL HIGH PROTEIN) 1,000 mL (08/07/18 0802)  . vancomycin Stopped (08/07/18 0909)    PRN Meds: acetaminophen **OR** acetaminophen, bisacodyl, fentaNYL (SUBLIMAZE) injection, hydrALAZINE, ipratropium-albuterol, [DISCONTINUED] ondansetron **OR** ondansetron (ZOFRAN) IV, sennosides, sodium chloride flush  Physical Exam  Constitutional: He appears lethargic. He appears ill.  HENT:  Head: Normocephalic and atraumatic.  Cardiovascular: Regular rhythm.  Pulmonary/Chest: No accessory muscle usage. No tachypnea. No respiratory distress.  Trach collar  Abdominal: There is no tenderness.  Neurological: He appears lethargic.  Eyes open. Will not follow commands. Does respond to oral care/resists pupil assessment. PERRLA.  Skin: Skin is warm and dry. There is pallor.  Psychiatric: Cognition and memory are impaired. He is noncommunicative. He is inattentive.  Nursing note and vitals reviewed.          Vital Signs: BP (!) 158/72   Pulse 72   Temp 98.1 F (36.7 C)   Resp (!) 23   Ht 5' 8" (1.727 m)   Wt 111.9 kg   SpO2 96%   BMI 37.51 kg/m  SpO2: SpO2: 96  % O2 Device: O2 Device: Tracheostomy Collar O2 Flow Rate: O2 Flow Rate (L/min): 8 L/min  Intake/output summary:   Intake/Output Summary (Last 24 hours) at 08/07/2018 1640 Last data filed at 08/07/2018 1400 Gross per 24 hour  Intake 2830.31 ml  Output 2150 ml  Net 680.31 ml   LBM: Last BM Date: 08/05/18 Baseline Weight: Weight: 117 kg Most recent weight: Weight: 111.9 kg       Palliative Assessment/Data: PPS 20%   Flowsheet Rows     Most Recent Value  Intake Tab  Referral Department  Critical care  Unit at Time of Referral  ICU  Palliative Care Primary Diagnosis  Neurology  Palliative Care Type  New Palliative care  Reason for referral  Clarify Goals of Care  Date first seen by Palliative Care  08/05/18  Clinical Assessment  Palliative Performance Scale Score  20%  Psychosocial & Spiritual Assessment  Palliative Care Outcomes  Patient/Family meeting held?  Yes  Who was at the meeting?  wife, aunt  Palliative Care Outcomes  Clarified goals of care, ACP counseling assistance, Provided psychosocial or spiritual support      Patient Active Problem List   Diagnosis Date Noted  . Palliative care by specialist   . Goals of care, counseling/discussion   . Hypoglycemia   . Acute respiratory failure ([place])   . Altered mental status   . Hypertensive urgency   . Acute encephalopathy   . Sepsis ([place]) 08/06/2018  . Iron deficiency anemia 07/24/2018  . Hypertriglyceridemia 01/27/2017  . Acute renal failure superimposed on stage 4 chronic kidney disease ([place]) 07/07/2016  . Chronic respiratory failure with hypoxia and hypercapnia ([place]) 07/07/2016  . Acute pancreatitis 06/23/2016  . Stage 4 chronic kidney disease ([place]) 04/06/2015  . Chronic diastolic CHF (congestive heart failure), NYHA class 3 ([place]) 02/14/2015  . Benign essential hypertension 02/07/2015  . Depression 10/27/2014  . Type 2 diabetes mellitus with diabetic chronic kidney disease ([place]) 07/29/2014  . Chronic low  back pain 07/23/2014  . Exertional  chest pain 07/23/2014  . Insulin resistance 05/13/2014  . Long-term insulin use ([place]) 05/13/2014  . Bilateral edema of lower extremity 04/05/2014  . 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 GERD (gastroesophageal reflux disease) 11/27/2013  . Obesity, unspecified 11/27/2013  . Tracheostomy in place [facility]) 11/27/2013  . Vocal cord paralysis 08/12/2013  . COPD (chronic obstructive pulmonary disease) ([place]) 07/28/2013  . Tracheostomy status ([place]) 07/28/2013  . Nocturnal hypoxemia 07/28/2013  . HCAP (healthcare-associated pneumonia) 07/28/2013    Palliative Care Assessment & Plan   Patient Profile: 57 y.o. male  with past medical history of OSA, COPD, chronic trach, HTN, HLD, GERD, DM, CKD stage IV, depression, anemia, CHF, recent AV fistula placement admitted on 08/04/2018 with altered mental status. Patient was found unresponsive at home by wife. When EMS arrived, blood sugar was 45 and spouse reported seizure-like activity. Posturing noted by EMS. Oxygen was found to be 70's with trach collar off. In ED, CT head negative for acute findings. Chest xray revealed bilateral pulmonary infiltrates concern for pneumonia versus pulmonary edema. Trach exchanged with cuff and patient placed on full ventilator support. Continues to be unresponsive. Neurology following. Repeat CT negative for acute findings. EEG abnormal secondary to background slowing, possible metabolic encephalopathy. Palliative medicine consultation for goals of care.   Assessment: Acute on chronic respiratory failure Chronic tracheostomy Hx COPD Hx OSA Encephalopathy Suspected pneumonia Pulmonary edema Hypoglycemia resolved Hypernatremia CKD  Recommendations/Plan:  FULL code/FULL scope treatment per patient wishes. Wife shares that they recently completed advanced directives outpatient and he "definitely did not want a DNR." Discussed likely need for PEG tube placement with ongoing cognitive  impairment and inability to work with therapy. [employer] copy give for review. Wife tearful throughout conversation and seems to be processing MRI results. She understands she is faced with big decisions.   PMT not at [facility] over the weekend but will continue to support patient/family next week. Wife may benefit from ongoing palliative support.   Goals of Care and Additional Recommendations:  Limitations on Scope of Treatment: Full Scope Treatment  Code Status: FULL   Code Status Orders  (From admission, onward)         Start     Ordered   08/10/2018 1918  Full code  Continuous     08/16/2018 1918        Code Status History    This patient has a current code status but no historical code status.    Advance Directive Documentation     Most Recent Value  Type of Advance Directive  Healthcare Power of Attorney, Living will  Pre-existing out of facility DNR order (yellow form or pink MOST form)  -  "MOST" Form in Place?  -       Prognosis:   Unable to determine  Discharge Planning:  To Be Determined  Care plan was discussed with RN, wife, aunt, Dr. Doy Mince  Thank you for allowing the Palliative Medicine Team to assist in the care of this patient.   Time In: 1515 Time Out: 1600 Total Time 45 Prolonged Time Billed no      Greater than 50%  of this time was spent counseling and coordinating care related to the above assessment and plan.  Ihor Dow, FNP-C Palliative Medicine Team  Phone: [phone] Fax: [phone]  Please contact Palliative Medicine Team phone at [phone] for questions and concerns.

## 2018-08-07 NOTE — Progress Notes (Signed)
Subjective: Patient opens eyes spontaneously without stimulation. He is more awake today but still not able to follow commands.  Objective: Current vital signs: BP 137/72   Pulse 71   Temp 98.1 F (36.7 C)   Resp 20   Ht 5' 8"  (1.727 m)   Wt 111.9 kg   SpO2 98%   BMI 37.51 kg/m  Vital signs in last 24 hours: Temp:  [97.7 F (36.5 C)-99.5 F (37.5 C)] 98.1 F (36.7 C) (11/14 0700) Pulse Rate:  [64-77] 71 (11/14 1200) Resp:  [15-23] 20 (11/14 1200) BP: (105-167)/(54-107) 137/72 (11/14 1200) SpO2:  [92 %-100 %] 98 % (11/14 1200) FiO2 (%):  [35 %-47 %] 35 % (11/14 1115) Weight:  [111.1 kg-111.9 kg] 111.9 kg (11/14 0342)  Intake/Output from previous day: 11/13 0701 - 11/14 0700 In: 2502 [I.V.:519.2; NG/GT:1792.5; IV Piggyback:190.3] Out: 2500 [Urine:2500] Intake/Output this shift: No intake/output data recorded. Nutritional status:  Diet Order    None      Neurologic Exam:  Mental Status: Awake and appears alert but does not respond to verbal stimuli.Mild grimaceto deep sternal rub. Does not follow commands. No verbalizations noted.  Cranial Nerves: II: patient does not respond confrontation bilaterally, pupils right33m, left 376mand reactivebilaterally III,IV,VI: doll's responsepresent bilaterally.  Resists head movement V,VII: corneal reflexpresentbilaterally VIII: patient does not respond to verbal stimuli IX,X: gag reflexreduced, XI: trapezius strength unable to test bilaterally XII: tongue strength unable to test Motor: Spontaneous movement noted of the lower extremities. Sensory: Does not respond to noxious stimuli in any extremity.  Lab Results: Basic Metabolic Panel: Recent Labs  Lab 08/02/18 1145 08/02/18 1728 08/02/18 2115 08/03/18 0504 08/04/18 0729 08/05/18 0538 08/06/18 0436 08/07/18 0414 08/07/18 1101  NA  --   --  140  --  145 149* 150* 148*  --   K  --   --  3.3*  --  3.4* 3.9 3.5 3.4* 3.5  CL  --   --  106  --  111 116*  114* 114*  --   CO2  --   --  23  --  24 26 25 28   --   GLUCOSE  --   --  387*  --  283* 306* 261* 242*  --   BUN  --   --  48*  --  63* 78* 90* 88*  --   CREATININE  --   --  3.63*  --  3.44* 3.30* 3.08* 3.00*  --   CALCIUM  --   --  8.1*  --  7.9* 8.3* 8.4* 8.4*  --   MG 2.5* 2.1  --  2.2 2.0  --  2.5* 2.2  --   PHOS 5.5* 5.5*  --  3.5 3.7  --   --  3.0  --     Liver Function Tests: Recent Labs  Lab 07/28/2018 1730 08/02/18 0356 08/04/18 0729 08/05/18 0538 08/07/18 0414  AST 30 22  --  39 54*  ALT 17 16  --  16 29  ALKPHOS 124 99  --  121 159*  BILITOT 0.7 1.0  --  0.7 0.6  PROT 7.7 6.8  --  6.9 6.6  ALBUMIN 2.5* 2.0* 2.0* 2.1* 2.2*   Recent Labs  Lab 08/08/2018 1730  LIPASE 35   Recent Labs  Lab 08/05/2018 1730  AMMONIA 19    CBC: Recent Labs  Lab 08/02/2018 1730 08/02/18 0356 08/03/18 0504 08/05/18 0538 08/06/18 0436 08/07/18 0414  WBC 18.4* 12.1* 11.4* 8.3  8.9 11.4*  NEUTROABS 16.9*  --   --   --  5.9 7.7  HGB 9.7* 8.1* 8.7* 9.2* 9.2* 9.3*  HCT 32.1* 26.4* 28.2* 31.0* 31.2* 31.6*  MCV 86.5 86.3 84.4 89.3 89.1 90.0  PLT 291 233 286 267 257 263    Cardiac Enzymes: Recent Labs  Lab 08/02/2018 1730 08/02/18 0522  TROPONINI 0.03* 0.06*    Lipid Panel: No results for input(s): CHOL, TRIG, HDL, CHOLHDL, VLDL, LDLCALC in the last 168 hours.  CBG: Recent Labs  Lab 08/06/18 1937 08/07/18 0011 08/07/18 0338 08/07/18 0751 08/07/18 1114  GLUCAP 232* 218* 188* 166* 208*    Microbiology: Results for orders placed or performed during the hospital encounter of 07/31/2018  Urine Culture     Status: None   Collection Time: 08/05/2018  5:32 PM  Result Value Ref Range Status   Specimen Description   Final    URINE, RANDOM Performed at [facility], [address], [place], [place] [address]    Special Requests   Final    NONE Performed at [facility], [address], [place], [place] [address]    Culture   Final    NO GROWTH Performed  at [facility], [place] [address], [place], [place] [address]    Report Status 08/02/2018 FINAL  Final  Blood culture (routine x 2)     Status: None   Collection Time: 08/16/2018  7:10 PM  Result Value Ref Range Status   Specimen Description BLOOD RIGHT ANTECUBITAL  Final   Special Requests   Final    BOTTLES DRAWN AEROBIC AND ANAEROBIC Blood Culture adequate volume   Culture   Final    NO GROWTH 5 DAYS Performed at [facility], [address] [place], [place] [address]    Report Status 08/06/2018 FINAL  Final  Blood culture (routine x 2)     Status: None   Collection Time: 08/06/2018  7:10 PM  Result Value Ref Range Status   Specimen Description BLOOD BLOOD RIGHT FOREARM  Final   Special Requests   Final    BOTTLES DRAWN AEROBIC AND ANAEROBIC Blood Culture adequate volume   Culture   Final    NO GROWTH 5 DAYS Performed at [facility], [address], [place], [place] [address]    Report Status 08/06/2018 FINAL  Final  MRSA PCR Screening     Status: None   Collection Time: 07/29/2018  9:05 PM  Result Value Ref Range Status   MRSA by PCR NEGATIVE NEGATIVE Final    Comment:        The GeneXpert MRSA Assay (FDA approved for NASAL specimens only), is one component of a comprehensive MRSA colonization surveillance program. It is not intended to diagnose MRSA infection nor to guide or monitor treatment for MRSA infections. Performed at [facility], [address] [place], [place] [address]   Culture, respiratory (non-expectorated)     Status: None   Collection Time: 08/02/18  5:04 AM  Result Value Ref Range Status   Specimen Description   Final    TRACHEAL ASPIRATE Performed at [facility], [address], [place], [place] [address]    Special Requests   Final    NONE Performed at [facility], [place], [place] [address]    Gram Stain   Final    MODERATE SQUAMOUS EPITHELIAL CELLS PRESENT ABUNDANT WBC  PRESENT, PREDOMINANTLY PMN FEW GRAM POSITIVE RODS RARE Lonell Grandchild  POSITIVE COCCI    Culture   Final    FEW Consistent with normal respiratory flora. Performed at [facility], [place] [address], [place], [place] [address]    Report Status 08/04/2018 FINAL  Final  CULTURE, BLOOD (ROUTINE X 2) w Reflex to ID Panel     Status: None (Preliminary result)   Collection Time: 08/06/18 10:34 AM  Result Value Ref Range Status   Specimen Description BLOOD BLOOD RIGHT HAND  Final   Special Requests   Final    BOTTLES DRAWN AEROBIC AND ANAEROBIC Blood Culture adequate volume   Culture   Final    NO GROWTH < 24 HOURS Performed at [facility], [address] [place], [place] [address]    Report Status PENDING  Incomplete  CULTURE, BLOOD (ROUTINE X 2) w Reflex to ID Panel     Status: None (Preliminary result)   Collection Time: 08/06/18 10:39 AM  Result Value Ref Range Status   Specimen Description BLOOD BLOOD RIGHT WRIST  Final   Special Requests   Final    BOTTLES DRAWN AEROBIC AND ANAEROBIC Blood Culture adequate volume   Culture   Final    NO GROWTH < 24 HOURS Performed at [facility], [address], [place], [place] [address]    Report Status PENDING  Incomplete  Culture, respiratory     Status: None (Preliminary result)   Collection Time: 08/06/18 11:23 AM  Result Value Ref Range Status   Specimen Description   Final    TRACHEAL ASPIRATE Performed at [facility], [address], [place], [place] [address]    Special Requests   Final    NONE Performed at [facility], [address] [place], [place] [address]    Gram Stain   Final    RARE WBC PRESENT, PREDOMINANTLY PMN RARE SQUAMOUS EPITHELIAL CELLS PRESENT ABUNDANT GRAM NEGATIVE RODS    Culture   Final    TOO YOUNG TO READ Performed at [facility], [place] [address], [place], [place] [address]    Report Status PENDING  Incomplete    Coagulation Studies: No results for  input(s): LABPROT, INR in the last 72 hours.  Imaging: Mr Brain Wo Contrast  Result Date: 08/07/2018 CLINICAL DATA:  Altered mental status yesterday. Assess for anoxic brain injury. History of diabetes, hypertension and hyperlipidemia. EXAM: MRI HEAD WITHOUT CONTRAST TECHNIQUE: Multiplanar, multiecho pulse sequences of the brain and surrounding structures were obtained without intravenous contrast. COMPARISON:  CT HEAD November 11th 1,019 FINDINGS: Mild motion degraded examination. INTRACRANIAL CONTENTS: Faint symmetric bilateral hippocampi of reduced diffusion with low ADC values. No susceptibility artifact to suggest hemorrhage. The ventricles and sulci are normal for patient's age. RIGHT inferior cerebellar developmental venous anomaly no suspicious parenchymal signal, masses, mass effect. No abnormal extra-axial fluid collections. No extra-axial masses. VASCULAR: Normal major intracranial vascular flow voids present at skull base. SKULL AND UPPER CERVICAL SPINE: No abnormal sellar expansion. No suspicious calvarial bone marrow signal. Craniocervical junction maintained. SINUSES/ORBITS: The mastoid air-cells and included paranasal sinuses are well-aerated.The included ocular globes and orbital contents are non-suspicious. OTHER: None. IMPRESSION: 1. Mild motion degraded examination. 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 2. Otherwise negative noncontrast MRI head. 3. These results will be called to the ordering clinician or representative by the professional radiologist assistant, and communication documented in [employer] Dashboard. Electronically Signed   By: Thana Farr.D.  On: 08/07/2018 00:34   US Venous Img Lower Bilateral  Result Date: 08/06/2018 CLINICAL DATA:  Bilateral lower extremity edema for 1 week. EXAM: BILATERAL LOWER EXTREMITY VENOUS DOPPLER ULTRASOUND  TECHNIQUE: Gray-scale sonography with graded compression, as well as color Doppler and duplex ultrasound were performed to evaluate the lower extremity deep venous systems from the level of the common femoral vein and including the common femoral, femoral, profunda femoral, popliteal and calf veins including the posterior tibial, peroneal and gastrocnemius veins when visible. The superficial great saphenous vein was also interrogated. Spectral Doppler was utilized to evaluate flow at rest and with distal augmentation maneuvers in the common femoral, femoral and popliteal veins. COMPARISON:  None. FINDINGS: RIGHT LOWER EXTREMITY Common Femoral Vein: No evidence of thrombus. Normal compressibility, respiratory phasicity and response to augmentation. Saphenofemoral Junction: No evidence of thrombus. Normal compressibility and flow on color Doppler imaging. Profunda Femoral Vein: No evidence of thrombus. Normal compressibility and flow on color Doppler imaging. Femoral Vein: No evidence of thrombus. Normal compressibility, respiratory phasicity and response to augmentation. Popliteal Vein: No evidence of thrombus. Normal compressibility, respiratory phasicity and response to augmentation. Calf Veins: No evidence of thrombus. Normal compressibility and flow on color Doppler imaging. Superficial Great Saphenous Vein: No evidence of thrombus. Normal compressibility. Venous Reflux:  None. Other Findings: No evidence of superficial thrombophlebitis or abnormal fluid collection. LEFT LOWER EXTREMITY Common Femoral Vein: No evidence of thrombus. Normal compressibility, respiratory phasicity and response to augmentation. Saphenofemoral Junction: No evidence of thrombus. Normal compressibility and flow on color Doppler imaging. Profunda Femoral Vein: No evidence of thrombus. Normal compressibility and flow on color Doppler imaging. Femoral Vein: No evidence of thrombus. Normal compressibility, respiratory phasicity and response  to augmentation. Popliteal Vein: No evidence of thrombus. Normal compressibility, respiratory phasicity and response to augmentation. Calf Veins: No evidence of thrombus. Normal compressibility and flow on color Doppler imaging. Superficial Great Saphenous Vein: No evidence of thrombus. Normal compressibility. Venous Reflux:  None. Other Findings: No evidence of superficial thrombophlebitis or abnormal fluid collection. IMPRESSION: No evidence of bilateral lower extremity deep venous thrombosis. Electronically Signed   By: Aletta Edouard M.D.   On: 08/06/2018 17:18   Dg Chest Port 1 View  Result Date: 08/07/2018 CLINICAL DATA:  Shortness of breath EXAM: PORTABLE CHEST 1 VIEW COMPARISON:  08/06/2018 FINDINGS: Cardiomegaly with vascular congestion and bibasilar atelectasis. Low lung volumes. Tracheostomy and NG tube are unchanged. IMPRESSION: Low lung volumes with cardiomegaly, vascular congestion and bibasilar atelectasis. Findings are similar prior study. Electronically Signed   By: Rolm Baptise M.D.   On: 08/07/2018 08:32   Dg Chest Port 1 View  Result Date: 08/06/2018 CLINICAL DATA:  Shortness of Breath EXAM: PORTABLE CHEST 1 VIEW COMPARISON:  08/05/2018 FINDINGS: Cardiac shadow is at the upper limits of normal in size but stable. Tracheostomy tube and nasogastric catheter are again noted and stable. The lungs are well aerated with increasing bibasilar density likely related atelectasis. No sizable effusion is seen. Mild vascular congestion remains. IMPRESSION: Stable vascular congestion with increasing bibasilar atelectasis. Electronically Signed   By: Inez Catalina M.D.   On: 08/06/2018 08:21    Medications:  I have reviewed the patient's current medications. Prior to Admission:  Medications Prior to Admission  Medication Sig Dispense Refill Last Dose  . albuterol (ACCUNEB) 1.25 MG/3ML nebulizer solution Inhale 1 ampule into the lungs every 6 (six) hours as needed for wheezing or shortness of  breath.    unknown at unknown  . arformoterol (  BROVANA) 15 MCG/2ML NEBU Take 15 mcg by nebulization 2 (two) times daily.    unknown at unknown  . calcitRIOL (ROCALTROL) 0.25 MCG capsule Take 0.25 mcg by mouth every evening.   12 unknown at unknown  . carvedilol (COREG) 25 MG tablet Take 25 mg by mouth 2 (two) times daily with a meal.   unknown at unknown  . clindamycin (CLEOCIN T) 1 % external solution Apply 1 application topically 2 (two) times daily as needed (rash).    unknown at unknown  . Continuous Blood Gluc Sensor (FREESTYLE LIBRE SENSOR SYSTEM) MISC USE 1 EACH EVERY 10 (TEN) DAYS E11.65  11 07/29/2018 at Unknown time  . cyclobenzaprine (FLEXERIL) 10 MG tablet Take 10 mg by mouth at bedtime.   0 unknown at unknown  . docusate sodium (COLACE) 100 MG capsule Take 100 mg by mouth at bedtime.   unknown at unknown  . esomeprazole (NEXIUM) 20 MG capsule Take 40 mg by mouth daily.    unknown at unknown  . fenofibrate (TRICOR) 145 MG tablet Take 145 mg by mouth every evening.   0 unknown at unknown  . furosemide (LASIX) 40 MG tablet Take 40 mg by mouth See admin instructions. Take 40 mg daily may take a second 40 mg dose as needed for swelling or weight gain 2lbs in 24 hours   unknown at unknown  . HUMULIN R 500 UNIT/ML injection Inject 25 Units into the skin 3 (three) times daily with meals.   5 unknown at unknown  . hydrocortisone valerate cream (WESTCORT) 0.2 % Apply 1 application topically 2 (two) times daily as needed (itching / rash).    unknown at unknown  . hydrOXYzine (ATARAX/VISTARIL) 25 MG tablet Take 25 mg by mouth at bedtime.    unknown at unknown  . lisinopril (PRINIVIL,ZESTRIL) 20 MG tablet Take 20 mg by mouth at bedtime.    unknown at unknown  . Melatonin 10 MG TABS Take 20 mg by mouth at bedtime.   unknown at unknown  . Multiple Vitamin (MULTIVITAMIN) tablet Take 1 tablet by mouth daily.   unknown at unknown  . naproxen sodium (ALEVE) 220 MG tablet Take 220 mg by mouth 2 (two) times  daily as needed (for pain).    unknown at unknown  . oxyCODONE-acetaminophen (PERCOCET) 5-325 MG tablet Take 1-2 tablets by mouth every 6 (six) hours as needed for moderate pain or severe pain. 40 tablet 0 unknown at unknown  . PARoxetine (PAXIL) 30 MG tablet Take 30 mg by mouth at bedtime.    unknown at unknown  . promethazine (PHENERGAN) 25 MG tablet Take 25 mg by mouth every 8 (eight) hours as needed for nausea or vomiting.    unknown at unknown  . Revefenacin 175 MCG/3ML SOLN Inhale 175 mcg into the lungs daily.   unknown at unknown  . rosuvastatin (CRESTOR) 20 MG tablet Take 20 mg by mouth daily.   2 unknown at unknown  . sodium chloride HYPERTONIC 3 % nebulizer solution Take 3 mLs by nebulization as needed for cough.   unknown at unknown  . Vitamin D, Ergocalciferol, (DRISDOL) 50000 units CAPS capsule Take 50,000 Units by mouth every 30 (thirty) days.   6 unknown at unknown   Scheduled: . amLODipine  10 mg Per NG tube Daily  . carvedilol  37.5 mg Per Tube BID WC  . chlorhexidine gluconate (MEDLINE KIT)  15 mL Mouth Rinse BID  . famotidine  20 mg Per Tube Daily  . feeding supplement (  PRO-STAT SUGAR FREE 64)  30 mL Per Tube TID  . free water  200 mL Per Tube Q4H  . heparin  5,000 Units Subcutaneous Q8H  . hydrALAZINE  75 mg Per Tube Q8H  . insulin aspart  0-15 Units Subcutaneous Q4H  . insulin aspart  4 Units Subcutaneous Q4H  . insulin glargine  24 Units Subcutaneous BID  . isosorbide dinitrate  30 mg Per Tube BID  . levofloxacin  750 mg Per Tube Q48H  . lisinopril  40 mg Per Tube Daily  . mouth rinse  15 mL Mouth Rinse 10 times per day  . multivitamin  15 mL Per Tube Daily  . sodium chloride flush  3 mL Intravenous Q12H   Patient seen and examined.  Clinical course and management discussed.  Necessary edits performed.  I agree with the above.  Assessment and plan of care developed and discussed below.    Assessment: 57 y.o male found unresponsive by family. Initially hypoglycemic  with witnessed seizure-like activity and posturing. Patient is more awake and opens eyes but still unable to follow commands.  Work up to date unrevealing. Currently on Vanc and Levaquin for coverage of suspected infectious process due to fever of unknown etiology now improved. 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  Suspect hypoxic/hypoglycemic etiology likely.  With this in mind do not expect patient to return to baseline although will likely have some improvement from current clinical presentation.  Only time will tell how much.    Case discussed with Dr. Manuella Ghazi   LOS: 6 days    08/07/2018  12:55 PM  Alexis Goodell, MD Neurology [phone]  08/07/2018  2:02 PM

## 2018-08-07 NOTE — Progress Notes (Signed)
PT Cancellation Note  Patient Details Name: Matthew OharaDonald W Laube MRN: [id number] DOB: 11/24/1960   Cancelled Treatment:    Reason Eval/Treat Not Completed: Patient's level of consciousness.  Nursing reports to PT pt is not yet doing anything on command and is becoming more active but not alert yet.  Will try again at another time.   Ivar DrapeRuth E Cree Napoli 08/07/2018, 8:46 AM   Samul Dadauth Banessa Mao, PT MS Acute Rehab Dept. Number: [facility] [id number] and [facility] [phone]

## 2018-08-07 NOTE — Progress Notes (Signed)
Inpatient Diabetes Program Recommendations  AACE/ADA: New Consensus Statement on Inpatient Glycemic Control (2019)  Target Ranges:  Prepandial:   less than 140 mg/dL      Peak postprandial:   less than 180 mg/dL (1-2 hours)      Critically ill patients:  140 - 180 mg/dL   Results for Matthew Mayer, Matthew Mayer (MRN [id number]) as of 08/07/2018 08:07  Ref. Range 08/06/2018 08:00 08/06/2018 11:07 08/06/2018 15:46 08/06/2018 19:37 08/07/2018 00:11 08/07/2018 03:38 08/07/2018 07:51  Glucose-Capillary Latest Ref Range: 70 - 99 mg/dL 782210 (H) 956293 (H) 213279 (H) 232 (H) 218 (H) 188 (H) 166 (H)   Results for Matthew Mayer, Matthew Mayer (MRN [id number]) as of 08/06/2018 08:14  Ref. Range 08/05/2018 08:03 08/05/2018 11:51 08/05/2018 16:28 08/05/2018 19:32 08/05/2018 23:18 08/06/2018 04:07  Glucose-Capillary Latest Ref Range: 70 - 99 mg/dL 629188 (H) 528171 (H) 413280 (H) 241 (H) 197 (H) 214 (H)    Review of Glycemic Control Diabetes history: DM2 Outpatient Diabetes medications: Humulin R U500 125 units with meals Current orders for Inpatient glycemic control: Lantus 22 units BID, Novolog 0-15 units Q4H, Novolog 4 units Q4H for tube feeding coverage  Inpatient Diabetes Program Recommendations:   Insulin - Basal: Please consider increasing Lantus to 25 units BID.  Insulin - Tube Feeding Coverage: Please consider increasing tube feeding coverage to Novolog 6 units Q4H.  If tube feeding is stopped or held then Novolog tube feeding coverage should also be stopped or held.  Thanks, Orlando PennerMarie Briggette Najarian, RN, MSN, CDE Diabetes Coordinator Inpatient Diabetes Program [phone] (Team Pager from 8am to 5pm)

## 2018-08-07 NOTE — Progress Notes (Signed)
Pt transported on vent to and from MRI without incident.

## 2018-08-07 NOTE — Progress Notes (Signed)
[facility], [place] 08/07/18  Subjective:  Pt has been taken off the ventilator and transition to high flow through his tracheostomy. Not following commands at the moment.   Objective:  Vital signs in last 24 hours:  Temp:  [97.7 F (36.5 C)-99.5 F (37.5 C)] 98.1 F (36.7 C) (11/14 0700) Pulse Rate:  [68-82] 70 (11/14 0754) Resp:  [16-23] 17 (11/14 0754) BP: (105-173)/(54-107) 143/63 (11/14 0700) SpO2:  [95 %-100 %] 96 % (11/14 0754) FiO2 (%):  [35 %-47 %] 35 % (11/14 0754) Weight:  [111.1 kg-111.9 kg] 111.9 kg (11/14 0342)  Weight change: 0.7 kg Filed Weights   08/05/18 0433 08/06/18 0500 08/07/18 0342  Weight: 119.1 kg 111.2 kg 111.9 kg    Intake/Output:    Intake/Output Summary (Last 24 hours) at 08/07/2018 0900 Last data filed at 08/07/2018 0600 Gross per 24 hour  Intake 2422.01 ml  Output 2215 ml  Net 207.01 ml     Physical Exam: General:  Critically ill-appearing, laying in the bed  HEENT  dry oral mucosa  Neck  tracheostomy in place  Pulm/lungs  Scattered rhonchi  CVS/Heart  regular, no rub  Abdomen:   Soft, obese  Extremities:  No peripheral edema  Neurologic:  Did not respond to verbal or tactile stimuli  Skin:  Warm, dry  Access:  Left upper extremity developing new  AV fistula       Basic Metabolic Panel:  Recent Labs  Lab 08/02/18 1145 08/02/18 1728 08/02/18 2115 08/03/18 0504 08/04/18 0729 08/05/18 0538 08/06/18 0436 08/07/18 0414  NA  --   --  140  --  145 149* 150* 148*  K  --   --  3.3*  --  3.4* 3.9 3.5 3.4*  CL  --   --  106  --  111 116* 114* 114*  CO2  --   --  23  --  _0 GLUCOSE  --   --  387*  --  283* 306* 261* 242*  BUN  --   --  48*  --  63* 78* 90* 88*  CREATININE  --   --  3.63*  --  3.44* 3.30* 3.08* 3.00*  CALCIUM  --   --  8.1*  --  7.9* 8.3* 8.4* 8.4*  MG 2.5* 2.1  --  2.2 2.0  --  2.5* 2.2  PHOS 5.5* 5.5*  --  3.5 3.7  --   --  3.0     CBC: Recent Labs  Lab  08/19/2018 1730 08/02/18 0356 08/03/18 0504 08/05/18 0538 08/06/18 0436 08/07/18 0414  WBC 18.4* 12.1* 11.4* 8.3 8.9 11.4*  NEUTROABS 16.9*  --   --   --  5.9 7.7  HGB 9.7* 8.1* 8.7* 9.2* 9.2* 9.3*  HCT 32.1* 26.4* 28.2* 31.0* 31.2* 31.6*  MCV 86.5 86.3 84.4 89.3 89.1 90.0  PLT 291 233 286 267 257 263     No results found for: HEPBSAG, HEPBSAB, HEPBIGM    Microbiology:  Recent Results (from the past 240 hour(s))  Urine Culture     Status: None   Collection Time: 08/22/2018  5:32 PM  Result Value Ref Range Status   Specimen Description   Final    URINE, RANDOM Performed at [facility], [address], [place], [place] [address]    Special Requests   Final    NONE Performed at [facility], [address], [place], [place] [address]  Culture   Final    NO GROWTH Performed at [facility], [place] [address] [place], [place] [address]    Report Status 08/02/2018 FINAL  Final  Blood culture (routine x 2)     Status: None   Collection Time: 08/16/2018  7:10 PM  Result Value Ref Range Status   Specimen Description BLOOD RIGHT ANTECUBITAL  Final   Special Requests   Final    BOTTLES DRAWN AEROBIC AND ANAEROBIC Blood Culture adequate volume   Culture   Final    NO GROWTH 5 DAYS Performed at [facility], [address], [place], [place] [address]    Report Status 08/06/2018 FINAL  Final  Blood culture (routine x 2)     Status: None   Collection Time: 08/03/2018  7:10 PM  Result Value Ref Range Status   Specimen Description BLOOD BLOOD RIGHT FOREARM  Final   Special Requests   Final    BOTTLES DRAWN AEROBIC AND ANAEROBIC Blood Culture adequate volume   Culture   Final    NO GROWTH 5 DAYS Performed at [facility], [address], [place], [place] [address]    Report Status 08/06/2018 FINAL  Final  MRSA PCR Screening     Status: None   Collection Time: 08/19/2018  9:05 PM  Result Value Ref Range Status   MRSA by PCR NEGATIVE  NEGATIVE Final    Comment:        The GeneXpert MRSA Assay (FDA approved for NASAL specimens only), is one component of a comprehensive MRSA colonization surveillance program. It is not intended to diagnose MRSA infection nor to guide or monitor treatment for MRSA infections. Performed at [facility], [address] [place], [place] [address]   Culture, respiratory (non-expectorated)     Status: None   Collection Time: 08/02/18  5:04 AM  Result Value Ref Range Status   Specimen Description   Final    TRACHEAL ASPIRATE Performed at [facility], [address], [place], [place] [address]    Special Requests   Final    NONE Performed at [facility], [place], [place] [address]    Gram Stain   Final    MODERATE SQUAMOUS EPITHELIAL CELLS PRESENT ABUNDANT WBC PRESENT, PREDOMINANTLY PMN FEW GRAM POSITIVE RODS RARE GRAM POSITIVE COCCI    Culture   Final    FEW Consistent with normal respiratory flora. Performed at [facility], [place] [address], [place], [place] [address]    Report Status 08/04/2018 FINAL  Final  CULTURE, BLOOD (ROUTINE X 2) w Reflex to ID Panel     Status: None (Preliminary result)   Collection Time: 08/06/18 10:34 AM  Result Value Ref Range Status   Specimen Description BLOOD BLOOD RIGHT HAND  Final   Special Requests   Final    BOTTLES DRAWN AEROBIC AND ANAEROBIC Blood Culture adequate volume   Culture   Final    NO GROWTH < 24 HOURS Performed at [facility], [address], [place], [place] [address]    Report Status PENDING  Incomplete  CULTURE, BLOOD (ROUTINE X 2) w Reflex to ID Panel     Status: None (Preliminary result)   Collection Time: 08/06/18 10:39 AM  Result Value Ref Range Status   Specimen Description BLOOD BLOOD RIGHT WRIST  Final   Special Requests   Final    BOTTLES DRAWN AEROBIC AND ANAEROBIC Blood Culture adequate volume   Culture  Final    NO GROWTH < 24  HOURS Performed at [facility], [address] [place], [place] [address]    Report Status PENDING  Incomplete  Culture, respiratory     Status: None (Preliminary result)   Collection Time: 08/06/18 11:23 AM  Result Value Ref Range Status   Specimen Description   Final    TRACHEAL ASPIRATE Performed at [facility], [address] [place], [place] [address]    Special Requests   Final    NONE Performed at [facility], [address] [place], [place] [address]    Gram Stain   Final    RARE WBC PRESENT, PREDOMINANTLY PMN RARE SQUAMOUS EPITHELIAL CELLS PRESENT ABUNDANT GRAM NEGATIVE RODS    Culture   Final    TOO YOUNG TO READ Performed at [facility], [place] [address], [place],  [address]    Report Status PENDING  Incomplete    Coagulation Studies: No results for input(s): LABPROT, INR in the last 72 hours.  Urinalysis: No results for input(s): COLORURINE, LABSPEC, PHURINE, GLUCOSEU, HGBUR, BILIRUBINUR, KETONESUR, PROTEINUR, UROBILINOGEN, NITRITE, LEUKOCYTESUR in the last 72 hours.  Invalid input(s): APPERANCEUR    Imaging: Mr Brain Wo Contrast  Result Date: 08/07/2018 CLINICAL DATA:  Altered mental status yesterday. Assess for anoxic brain injury. History of diabetes, hypertension and hyperlipidemia. EXAM: MRI HEAD WITHOUT CONTRAST TECHNIQUE: Multiplanar, multiecho pulse sequences of the brain and surrounding structures were obtained without intravenous contrast. COMPARISON:  CT HEAD November 11th 1,019 FINDINGS: Mild motion degraded examination. INTRACRANIAL CONTENTS: Faint symmetric bilateral hippocampi of reduced diffusion with low ADC values. No susceptibility artifact to suggest hemorrhage. The ventricles and sulci are normal for patient's age. RIGHT inferior cerebellar developmental venous anomaly no suspicious parenchymal signal, masses, mass effect. No abnormal extra-axial fluid collections. No extra-axial masses. VASCULAR:  Normal major intracranial vascular flow voids present at skull base. SKULL AND UPPER CERVICAL SPINE: No abnormal sellar expansion. No suspicious calvarial bone marrow signal. Craniocervical junction maintained. SINUSES/ORBITS: The mastoid air-cells and included paranasal sinuses are well-aerated.The included ocular globes and orbital contents are non-suspicious. OTHER: None. IMPRESSION: 1. Mild motion degraded examination. 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 2. Otherwise negative noncontrast MRI head. 3. These results will be called to the ordering clinician or representative by the professional radiologist assistant, and communication documented in [employer] Dashboard. Electronically Signed   By: Elon Alas M.D.   On: 08/07/2018 00:34   US Venous Img Lower Bilateral  Result Date: 08/06/2018 CLINICAL DATA:  Bilateral lower extremity edema for 1 week. EXAM: BILATERAL LOWER EXTREMITY VENOUS DOPPLER ULTRASOUND TECHNIQUE: Gray-scale sonography with graded compression, as well as color Doppler and duplex ultrasound were performed to evaluate the lower extremity deep venous systems from the level of the common femoral vein and including the common femoral, femoral, profunda femoral, popliteal and calf veins including the posterior tibial, peroneal and gastrocnemius veins when visible. The superficial great saphenous vein was also interrogated. Spectral Doppler was utilized to evaluate flow at rest and with distal augmentation maneuvers in the common femoral, femoral and popliteal veins. COMPARISON:  None. FINDINGS: RIGHT LOWER EXTREMITY Common Femoral Vein: No evidence of thrombus. Normal compressibility, respiratory phasicity and response to augmentation. Saphenofemoral Junction: No evidence of thrombus. Normal compressibility and flow on color Doppler imaging. Profunda Femoral  Vein: No evidence of thrombus. Normal compressibility and flow on color Doppler imaging. Femoral Vein: No evidence  of thrombus. Normal compressibility, respiratory phasicity and response to augmentation. Popliteal Vein: No evidence of thrombus. Normal compressibility, respiratory phasicity and response to augmentation. Calf Veins: No evidence of thrombus. Normal compressibility and flow on color Doppler imaging. Superficial Great Saphenous Vein: No evidence of thrombus. Normal compressibility. Venous Reflux:  None. Other Findings: No evidence of superficial thrombophlebitis or abnormal fluid collection. LEFT LOWER EXTREMITY Common Femoral Vein: No evidence of thrombus. Normal compressibility, respiratory phasicity and response to augmentation. Saphenofemoral Junction: No evidence of thrombus. Normal compressibility and flow on color Doppler imaging. Profunda Femoral Vein: No evidence of thrombus. Normal compressibility and flow on color Doppler imaging. Femoral Vein: No evidence of thrombus. Normal compressibility, respiratory phasicity and response to augmentation. Popliteal Vein: No evidence of thrombus. Normal compressibility, respiratory phasicity and response to augmentation. Calf Veins: No evidence of thrombus. Normal compressibility and flow on color Doppler imaging. Superficial Great Saphenous Vein: No evidence of thrombus. Normal compressibility. Venous Reflux:  None. Other Findings: No evidence of superficial thrombophlebitis or abnormal fluid collection. IMPRESSION: No evidence of bilateral lower extremity deep venous thrombosis. Electronically Signed   By: Aletta Edouard M.D.   On: 08/06/2018 17:18   Dg Chest Port 1 View  Result Date: 08/07/2018 CLINICAL DATA:  Shortness of breath EXAM: PORTABLE CHEST 1 VIEW COMPARISON:  08/06/2018 FINDINGS: Cardiomegaly with vascular congestion and bibasilar atelectasis. Low lung volumes. Tracheostomy and NG tube are unchanged. IMPRESSION: Low lung volumes with  cardiomegaly, vascular congestion and bibasilar atelectasis. Findings are similar prior study. Electronically Signed   By: Rolm Baptise M.D.   On: 08/07/2018 08:32   Dg Chest Port 1 View  Result Date: 08/06/2018 CLINICAL DATA:  Shortness of Breath EXAM: PORTABLE CHEST 1 VIEW COMPARISON:  08/05/2018 FINDINGS: Cardiac shadow is at the upper limits of normal in size but stable. Tracheostomy tube and nasogastric catheter are again noted and stable. The lungs are well aerated with increasing bibasilar density likely related atelectasis. No sizable effusion is seen. Mild vascular congestion remains. IMPRESSION: Stable vascular congestion with increasing bibasilar atelectasis. Electronically Signed   By: Inez Catalina M.D.   On: 08/06/2018 08:21     Medications:   . dextrose 30 mL/hr at 08/07/18 0600  . feeding supplement (VITAL HIGH PROTEIN) 1,000 mL (08/07/18 0802)  . vancomycin 1,000 mg (08/07/18 0809)   . amLODipine  10 mg Per NG tube Daily  . carvedilol  37.5 mg Per Tube BID WC  . chlorhexidine gluconate (MEDLINE KIT)  15 mL Mouth Rinse BID  . famotidine  20 mg Per Tube Daily  . feeding supplement (PRO-STAT SUGAR FREE 64)  30 mL Per Tube TID  . free water  200 mL Per Tube Q4H  . heparin  5,000 Units Subcutaneous Q8H  . hydrALAZINE  75 mg Per Tube Q8H  . insulin aspart  0-15 Units Subcutaneous Q4H  . insulin aspart  4 Units Subcutaneous Q4H  . insulin glargine  24 Units Subcutaneous BID  . isosorbide dinitrate  30 mg Per Tube BID  . levofloxacin  750 mg Per Tube Q48H  . lisinopril  40 mg Per Tube Daily  . mouth rinse  15 mL Mouth Rinse 10 times per day  . multivitamin  15 mL Per Tube Daily  . sodium chloride flush  3 mL Intravenous Q12H   acetaminophen **OR** acetaminophen, bisacodyl, fentaNYL (SUBLIMAZE) injection, hydrALAZINE, ipratropium-albuterol, [DISCONTINUED] ondansetron **OR** ondansetron (ZOFRAN) IV, sennosides, sodium chloride flush  Assessment/ Plan:  57 y.o. 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  Acute renal failure on chronic kidney disease stage IV Baseline creatinine 3.13, GFR 21 from July 07, 2018 -Creatinine appears to have stabilized at 3.0 with a BUN of 88.  Good urine output noted.  No acute indication for dialysis.  2.  Altered mental status Differential includes hypoglycemia, hypoxia, seizure Urine drug screen is positive for benzodiazepines -Responding only to painful stimuli.  3.  Diabetes type 2, insulin-dependent Poorly controlled Ischemic control as per critical care.  4. Acute respiratory failure.  Currently off of the ventilator and has been transitioned to high flow through his tracheostomy.  5.  Hypernatremia.  Serum sodium currently down to 148.  Continue free water flushes of 200 cc every 4 hours.   LOS: 6 Cane [place]ray 11/14/20199:00 AM  [address], [place]  Note: This note was prepared with Dragon dictation. Any transcription errors are unintentional

## 2018-08-07 NOTE — Progress Notes (Signed)
[facility] - [place] at [facility]   PATIENT NAME: Matthew Mayer    MR#:  [id number]  DATE OF BIRTH:  02/18/1961  SUBJECTIVE:    Alert, but not communicative REVIEW OF SYSTEMS:   Review of Systems  Unable to perform ROS: Mental acuity   DRUG ALLERGIES:   Allergies  Allergen Reactions  . Augmentin [Amoxicillin-Pot Clavulanate] Anaphylaxis  . Penicillins Anaphylaxis    Has patient had a PCN reaction causing immediate rash, facial/tongue/throat swelling, SOB or lightheadedness with hypotension: Yes Has patient had a PCN reaction causing severe rash involving mucus membranes or skin necrosis: No Has patient had a PCN reaction that required hospitalization: Yes Has patient had a PCN reaction occurring within the last 10 years: Yes If all of the above answers are "NO", then may proceed with Cephalosporin use.   . Codeine Itching  . Biaxin [Clarithromycin] Rash    VITALS:  Blood pressure (!) 143/63, pulse 68, temperature 98.1 F (36.7 C), resp. rate 16, height 5\' 8"  (1.727 m), weight 111.9 kg, SpO2 99 %.  PHYSICAL EXAMINATION:   Physical Exam  GENERAL:  57 y.o.-year-old patient lying in the bed with no acute distress. Critically and chronically ill EYES: Pupils equal, round, reactive to light and accommodation. No scleral icterus. Extraocular muscles intact.  HEENT: Head atraumatic, normocephalic. Oropharynx and nasopharynx clear. Tracheostomy++ NECK:  Supple, no jugular venous distention. No thyroid enlargementTrach ++ LUNGS: Normal breath sounds bilaterally, no wheezing, rales, rhonchi. No use of accessory muscles of respiration. On Venti mask via Trach CARDIOVASCULAR: S1, S2 normal. No murmurs, rubs, or gallops.  ABDOMEN: Soft, nontender, nondistended. Bowel sounds present. No organomegaly or mass.  EXTREMITIES: No cyanosis, clubbing or edema b/l.    NEUROLOGIC: Alert, but non communicative or not responsive, does not make eye  contact. PSYCHIATRIC: on vent mask via trach, non communicative.  SKIN: No obvious rash, lesion, or ulcer.   LABORATORY PANEL:  CBC Recent Labs  Lab 08/07/18 0414  WBC 11.4*  HGB 9.3*  HCT 31.6*  PLT 263    Chemistries  Recent Labs  Lab 08/07/18 0414  NA 148*  K 3.4*  CL 114*  CO2 28  GLUCOSE 242*  BUN 88*  CREATININE 3.00*  CALCIUM 8.4*  MG 2.2  AST 54*  ALT 29  ALKPHOS 159*  BILITOT 0.6   Cardiac Enzymes Recent Labs  Lab 08/02/18 0522  TROPONINI 0.06*   RADIOLOGY:  Mr Brain Wo Contrast  Result Date: 08/07/2018 CLINICAL DATA:  Altered mental status yesterday. Assess for anoxic brain injury. History of diabetes, hypertension and hyperlipidemia. EXAM: MRI HEAD WITHOUT CONTRAST TECHNIQUE: Multiplanar, multiecho pulse sequences of the brain and surrounding structures were obtained without intravenous contrast. COMPARISON:  CT HEAD November 11th 1,019 FINDINGS: Mild motion degraded examination. INTRACRANIAL CONTENTS: Faint symmetric bilateral hippocampi of reduced diffusion with low ADC values. No susceptibility artifact to suggest hemorrhage. The ventricles and sulci are normal for patient's age. RIGHT inferior cerebellar developmental venous anomaly no suspicious parenchymal signal, masses, mass effect. No abnormal extra-axial fluid collections. No extra-axial masses. VASCULAR: Normal major intracranial vascular flow voids present at skull base. SKULL AND UPPER CERVICAL SPINE: No abnormal sellar expansion. No suspicious calvarial bone marrow signal. Craniocervical junction maintained. SINUSES/ORBITS: The mastoid air-cells and included paranasal sinuses are well-aerated.The included ocular globes and orbital contents are non-suspicious. OTHER: None. IMPRESSION: 1. Mild motion degraded examination. 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 2.  Otherwise  negative noncontrast MRI head. 3. These results will be called to the ordering clinician or representative by the professional radiologist assistant, and communication documented in zVision Dashboard. Electronically Signed   By: Awilda Metroourtnay  Bloomer M.D.   On: 08/07/2018 00:34   [place] Venous Img Lower Bilateral  Result Date: 08/06/2018 CLINICAL DATA:  Bilateral lower extremity edema for 1 week. EXAM: BILATERAL LOWER EXTREMITY VENOUS DOPPLER ULTRASOUND TECHNIQUE: Gray-scale sonography with graded compression, as well as color Doppler and duplex ultrasound were performed to evaluate the lower extremity deep venous systems from the level of the common femoral vein and including the common femoral, femoral, profunda femoral, popliteal and calf veins including the posterior tibial, peroneal and gastrocnemius veins when visible. The superficial great saphenous vein was also interrogated. Spectral Doppler was utilized to evaluate flow at rest and with distal augmentation maneuvers in the common femoral, femoral and popliteal veins. COMPARISON:  None. FINDINGS: RIGHT LOWER EXTREMITY Common Femoral Vein: No evidence of thrombus. Normal compressibility, respiratory phasicity and response to augmentation. Saphenofemoral Junction: No evidence of thrombus. Normal compressibility and flow on color Doppler imaging. Profunda Femoral Vein: No evidence of thrombus. Normal compressibility and flow on color Doppler imaging. Femoral Vein: No evidence of thrombus. Normal compressibility, respiratory phasicity and response to augmentation. Popliteal Vein: No evidence of thrombus. Normal compressibility, respiratory phasicity and response to augmentation. Calf Veins: No evidence of thrombus. Normal compressibility and flow on color Doppler imaging. Superficial Great Saphenous Vein: No evidence of thrombus. Normal compressibility. Venous Reflux:  None. Other Findings: No evidence of superficial thrombophlebitis or abnormal fluid collection.  LEFT LOWER EXTREMITY Common Femoral Vein: No evidence of thrombus. Normal compressibility, respiratory phasicity and response to augmentation. Saphenofemoral Junction: No evidence of thrombus. Normal compressibility and flow on color Doppler imaging. Profunda Femoral Vein: No evidence of thrombus. Normal compressibility and flow on color Doppler imaging. Femoral Vein: No evidence of thrombus. Normal compressibility, respiratory phasicity and response to augmentation. Popliteal Vein: No evidence of thrombus. Normal compressibility, respiratory phasicity and response to augmentation. Calf Veins: No evidence of thrombus. Normal compressibility and flow on color Doppler imaging. Superficial Great Saphenous Vein: No evidence of thrombus. Normal compressibility. Venous Reflux:  None. Other Findings: No evidence of superficial thrombophlebitis or abnormal fluid collection. IMPRESSION: No evidence of bilateral lower extremity deep venous thrombosis. Electronically Signed   By: Irish LackGlenn  Yamagata M.D.   On: 08/06/2018 17:18   Dg Chest Port 1 View  Result Date: 08/06/2018 CLINICAL DATA:  Shortness of Breath EXAM: PORTABLE CHEST 1 VIEW COMPARISON:  08/05/2018 FINDINGS: Cardiac shadow is at the upper limits of normal in size but stable. Tracheostomy tube and nasogastric catheter are again noted and stable. The lungs are well aerated with increasing bibasilar density likely related atelectasis. No sizable effusion is seen. Mild vascular congestion remains. IMPRESSION: Stable vascular congestion with increasing bibasilar atelectasis. Electronically Signed   By: Alcide CleverMark  Lukens M.D.   On: 08/06/2018 08:21   Dg Chest Port 1 View  Result Date: 08/05/2018 CLINICAL DATA:  Acute respiratory failure EXAM: PORTABLE CHEST 1 VIEW COMPARISON:  08/04/2018 FINDINGS: Tracheostomy and NG tube are unchanged. Cardiomegaly. Mild vascular congestion and bibasilar opacities, likely atelectasis. No overt edema or effusions. No acute bony  abnormality. IMPRESSION: Cardiomegaly with vascular congestion.  Mild bibasilar atelectasis. Electronically Signed   By: Charlett NoseKevin  Dover M.D.   On: 08/05/2018 08:54   ASSESSMENT AND PLAN:  Matthew Mayer  is a 57 y.o. male with a known history of CKD stage IV,  hypertension, diabetes mellitus, COPD with tracheostomy who had AV fistula surgery 2 days back presents to the hospital brought in by EMS after he was found unresponsive by family.  He was found to have blood glucose of 42 along with oxygen saturations in the 70s.  Unclear of how long patient was down for.  He was given dextrose and put on oxygen and brought to the emergency room.   * Severe sepsis with bilateral pneumonia and acute on chronic hypoxic respiratory failure with acute encephalopathy Patient is critically ill.  - Off ventilator now on oxygen via his tracheostomy since he was using accessory muscles and is unresponsive to respiratory failure. - No IV fluids due to pulmonary edema. - Broad-spectrum with Levaquin.  * Acute encephalopathy metabolic versus anoxic -seen by neurology. -EEG results shows generalized slowing -repeat CT had negative  * CKD stage IV.   - Consult nephrology if dialysis is needed. - Patient had fistula placement in preparation for hemodialysis two days ago. - Received nephrology input   * Hypoglycemia with diabetes mellitus.  - resolved  *Hypertension.  IV hydralazine prn  *DVT prophylaxis with heparin  Overall carries a poor prognosis  Case discussed with Care Management/Social Worker. Management plans discussed with the patient, family and they are in agreement.  CODE STATUS:full  DVT Prophylaxis: heparin  TOTAL TIME TAKING CARE OF THIS PATIENT: *30* minutes.  >50% time spent on counselling and coordination of care  POSSIBLE D/C IN few* DAYS, DEPENDING ON CLINICAL CONDITION.  Note: This dictation was prepared with Dragon dictation along with smaller phrase technology. Any  transcriptional errors that result from this process are unintentional.  Altamese Dilling M.D on 08/07/2018 at 7:52 AM  Between 7am to 6pm - Pager - [phone]  After 6pm go to [email or web address] - Social research officer, government  [facility]  Office  [phone]  CC: Primary care physician; Marina Goodell, MDPatient ID: Matthew Mayer, male   DOB: [birth year], 57 y.o.   MRN: [id number]

## 2018-08-07 NOTE — Progress Notes (Signed)
OT Cancellation Note  Patient Details Name: Matthew OharaDonald W Mayer MRN: [id number] DOB: Sep 13, 1961   Cancelled Treatment:    Reason Eval/Treat Not Completed: Patient's level of consciousness. Spoke to Primary Nurse who reported that pt is not yet doing anything on command and is becoming more active but not alert yet.  Will attempt OT evaluation again tomorrow.  Thank you for the referral.  Matthew BordersSusan Kameshia Mayer, OTR/L ascom [phone] 08/07/18, 10:01 AM

## 2018-08-07 NOTE — Progress Notes (Addendum)
Pt profile: 40 M with CKD, Htn, DM2, chronic trach tube adm 11/08 (2 days after LUE AVF placed) after being found unresponsive by family. Was initially hypoglycemic with seizure-like activity and posturing documented.  Did not improve with administration of dextrose.  CXR on admission worrisome for pneumonia versus pulmonary edema.  Tracheostomy tube changed out and mechanical ventilation initiated on day of admission.  08/07/2018: - 7400 Blood sugar is 166 Better from before BP is better today  - Patient is more awake opening his eyes but still not following commands -Spoke with patient's wife suggested that patient has a chronic trach, patient had a complicated course after gallbladder surgery which resulted in trach however patient was completely mobile and functional before this episode -Fever is improved - MRI reviewed showed hypoxic hypoglycemic injury to hippocampus -Also has hyponatremia with sodium of 148   Lines, Tubes, etc: Chronic trach tube  Microbiology: MRSA PCR 11/08 >> NEG Urine 11/08 >> NEG Resp 11/09 >> NOF  Blood 11/08 >>   Blood: 08/06/2018 Sputum 08/06/2018  Antibiotics: Currently on Vanco plus Levaquin Anti-infectives (From admission, onward)   Start     Dose/Rate Route Frequency Ordered Stop   08/07/18 0800  vancomycin (VANCOCIN) IVPB 1000 mg/200 mL premix     1,000 mg 200 mL/hr over 60 Minutes Intravenous Every 18 hours 08/06/18 1515     08/06/18 1030  vancomycin (VANCOCIN) IVPB 1000 mg/200 mL premix     1,000 mg 200 mL/hr over 60 Minutes Intravenous  Once 08/06/18 1024 08/06/18 1441   08/04/18 1015  levofloxacin (LEVAQUIN) tablet 750 mg     750 mg Per Tube Every 48 hours 08/04/18 1012 08/12/18 1014   08/04/18 0321  vancomycin (VANCOCIN) 1,500 mg in sodium chloride 0.9 % 500 mL IVPB  Status:  Discontinued     1,500 mg 250 mL/hr over 120 Minutes Intravenous Every 48 hours 08/02/18 1415 08/04/18 0849   08/02/18 0600  aztreonam (AZACTAM) injection 1 g   Status:  Discontinued     1 g Intramuscular Every 8 hours 08/17/18 2011 08/02/18 0448   08/02/18 0600  aztreonam (AZACTAM) 1 g in sodium chloride 0.9 % 100 mL IVPB  Status:  Discontinued     1 g 200 mL/hr over 30 Minutes Intravenous Every 8 hours 08/02/18 0448 08/04/18 1012   08/02/18 0400  vancomycin (VANCOCIN) 1,500 mg in sodium chloride 0.9 % 500 mL IVPB  Status:  Discontinued     1,500 mg 250 mL/hr over 120 Minutes Intravenous Every 24 hours Aug 17, 2018 2011 08/02/18 1415   2018-08-17 1815  aztreonam (AZACTAM) 2 g in sodium chloride 0.9 % 100 mL IVPB     2 g 200 mL/hr over 30 Minutes Intravenous  Once 08/17/2018 1801 08/17/18 1946   Aug 17, 2018 1815  metroNIDAZOLE (FLAGYL) IVPB 500 mg  Status:  Discontinued     500 mg 100 mL/hr over 60 Minutes Intravenous Every 8 hours 08/17/2018 1801 08/02/18 1502   2018-08-17 1815  vancomycin (VANCOCIN) IVPB 1000 mg/200 mL premix     1,000 mg 200 mL/hr over 60 Minutes Intravenous  Once 2018-08-17 1801 17-Aug-2018 2120       Studies/Events: 11/08 CT head: No acute findings 11/09 EEG: 11/11 CT head: No acute findings 11/11 EEG: Metabolic encephalopathy 11/13 MRI: Symmetric hippocampal signal abnormalities seen with hypoxic ischemic encephalopathy, toxic encephalopathy (including hypoglycemia)  Consults:  11/08 ENT 11/09 Neurology   Best Practice: DVT: SQ heparin SUP: enteral famotidine Nutrition: TF protocol (11/10) Glycemic control: SSI protocol (  11/11) Sedation/analgesia: none required -EEG showed metabolic encephalopathy  Obj: Vitals:   08/07/18 0754 08/07/18 1115  BP:    Pulse: 70 71  Resp: 17 17  Temp:    SpO2: 96% 97%   Gen: Minimally responsive, Trach color HEENT: NCAT, sclerae white Neck: Trach site clean, JVP not visualized Chest: No wheezes, scattered rhonchi Cardiac: Regular, no M Abd: Obese, soft, no palpable masses, diminished BS Ext: Warm, symmetric edema Neuro: Grimaces to deep sternal rub.  Not following commands.  No  spontaneous movement.  No withdrawal  BMP Latest Ref Rng & Units 08/07/2018 08/06/2018 08/05/2018  Glucose 70 - 99 mg/dL 960(A242(H) 540(J261(H) 811(B306(H)  BUN 6 - 20 mg/dL 14(N88(H) 82(N90(H) 56(O78(H)  Creatinine 0.61 - 1.24 mg/dL 1.30(Q3.00(H) 6.57(Q3.08(H) 4.69(G3.30(H)  BUN/Creat Ratio 9 - 20 - - -  Sodium 135 - 145 mmol/L 148(H) 150(H) 149(H)  Potassium 3.5 - 5.1 mmol/L 3.4(L) 3.5 3.9  Chloride 98 - 111 mmol/L 114(H) 114(H) 116(H)  CO2 22 - 32 mmol/L 28 25 26   Calcium 8.9 - 10.3 mg/dL 2.9(B8.4(L) 2.8(U8.4(L) 8.3(L)      CBC Latest Ref Rng & Units 08/07/2018 08/06/2018 08/05/2018  WBC 4.0 - 10.5 K/uL 11.4(H) 8.9 8.3  Hemoglobin 13.0 - 17.0 g/dL 1.3(K9.3(L) 4.4(W9.2(L) 1.0(U9.2(L)  Hematocrit 39.0 - 52.0 % 31.6(L) 31.2(L) 31.0(L)  Platelets 150 - 400 K/uL 263 257 267      CXR: Cardiomegaly, RLL atelectasis versus infiltrate   IMPRESSION: 1) chronic tracheostomy tube 2) history of COPD 3) history of obstructive sleep apnea 4) history of hypertension 5) CKD, nonoliguric.  Creatinine approximately at baseline 6) acute on chronic hypoxemic respiratory failure 7) suspected pneumonia 8) pulmonary edema 9) hypertensive emergency, blood pressure still elevated will increase Coreg to 37.5 10) hypoglycemia, resolved 11) prolonged encephalopathy/obtundation-- Hippocampus injury suggest this might be new norm for patient  PLAN:  CVS: Blood pressure is better today, Continue hydralazine to 75 every 8, Continue lisinopril to 30 daily, continue Norvasc 10, Continue  Coreg to 37.5, echo showed EF of 55 to 60% RS: Patient was placed on trach collar doing better  - We will continue with that, ABG was checked and it was essentially okay - The patient does well continue with her trach collar with high flow if needed will consider pressure support with PEEP at nighttime -- Aspiration precaution -- Spoke with neurology this might be his new norm, Might need PEG will discuss with family  ID: Was treated with Vanco plus Azactam which was switched to Levaquin  on 08/04/2018, all the cultures are negative however patient had a fever spike 11/12 --Repeat cultures were done-- Negative so far -Currently on Vanco and levaquin doing well give total 7 days and adjust with culture - DVT study was done and was negative  ENDO: Blood sugars better after adjustment of Lantus to 24 units  -- SSI monitor blood sugar GI: Tube feeding as tolerated currently on Protonix RENAL: Renal consult appreciated currently not started on hemodialysis sodium is 148 creatinine is 3.03-improved with Lasix with potassium 3.4, will hold Lasix for today, will start patient on D5 water for elevated sodium along with free water flushes -- Electrolyte replacement protocol, Monitor Cr and K  CNS: EEG reviewed, shows diffuse metabolic encephalopathy, not responding much Neurology is following MRI shows some hypoxic injury -- This might be his new norm, will discuss with family  hEMATOLOGY: No acute issue-as per the family patient had some anemia and was getting worked up -- Monitor HB and  PLT MUSCULOSKELETAL: No acute issue PAIN AND SEDATION: Currently not on any medication use PRN meds  Skin/Wound: Chronic changes  Electrolytes: Replace electrolytes per ICU electrolyte replacement protocol.   IVF: D5 water 30  Nutrition: Tube feeds as tolerated  Prophylaxis: DVT Prophylaxis with heparin,. GI Prophylaxis.   Restraints: None  PT/OT eval and treat. OOB when appropriate.   Lines/Tubes: From 07/31/2018 Foley Got Midline RUE on 08/06/18 ADVANCE DIRECTIVE: Full code needs to address CODE STATUS with the patient's family. FAMILY DISCUSSION: Spoke with patient's 5 discussed patient's care at length Quality Care: PPI, DVT prophylaxis, HOB elevated, Infection control all reviewed and addressed. Events and notes from last 24 hours reviewed. Care plan discussed on multidisciplinary rounds CC TIME: 50     Old records reviewed discussed results and management plan with patient  Images  personally reviewed and results and labs reviewed and discussed with patient.  All medication reviewed and adjusted  Further management depending on test results and work up as outlined above.   Roseanne Reno, M.D

## 2018-08-07 NOTE — Consult Note (Signed)
Pharmacy Antibiotic Note  Matthew OharaDonald W Mayer is a 57 y.o. male admitted on 08/12/2018 with sepsis secondary to bilateral pneumonia.  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 He recently had the AV fistula placed for HD by Dr Gilda CreaseSchnier. There is a possibility that nephrology will begin hemodialysis, in which case we will adjust therapy. Patient was afebrile for the last 24 hours. Pharmacy has been consulted for levofloxacin and vancomycin dosing.   Plan: Continue Levofloxacin 750mg  IV q48h for a total of 7 days (end date 11/19).    Continue vancomycin 1000 mg IV q18h. Will get trough as clinically indicated.  Vanc trough goal 15-20.    Adjusted body weight = 85.52 kg   Height: 5\' 8"  (172.7 cm) Weight: 246 lb 11.1 oz (111.9 kg) IBW/kg (Calculated) : 68.4  Temp (24hrs), Avg:98.6 F (37 C), Min:97.7 F (36.5 C), Max:99.5 F (37.5 C)  Recent Labs  Lab 08/09/2018 1730 08/03/2018 1911 08/02/18 0356 08/02/18 0522 08/02/18 2115 08/03/18 0504 08/04/18 0729 08/05/18 0538 08/06/18 0436 08/07/18 0414  WBC 18.4*  --  12.1*  --   --  11.4*  --  8.3 8.9 11.4*  CREATININE 3.94*  --  3.78*  --  3.63*  --  3.44* 3.30* 3.08* 3.00*  LATICACIDVEN 0.9 0.7  --  1.3  --   --   --   --   --   --     Estimated Creatinine Clearance: 33 mL/min (A) (by C-G formula based on SCr of 3 mg/dL (H)).    Allergies  Allergen Reactions  . Augmentin [Amoxicillin-Pot Clavulanate] Anaphylaxis  . Penicillins Anaphylaxis    Has patient had a PCN reaction causing immediate rash, facial/tongue/throat swelling, SOB or lightheadedness with hypotension: Yes Has patient had a PCN reaction causing severe rash involving mucus membranes or skin necrosis: No Has patient had a PCN reaction that required hospitalization: Yes Has patient had a PCN reaction occurring within the last 10 years: Yes If all of the above answers are "NO", then may proceed with Cephalosporin use.   . Codeine Itching  . Biaxin [Clarithromycin] Rash     Antimicrobials this admission: vancomycin 11/8 >> 11/11                      11/13 >>  Flagyl 11/8 >> 11/9 Aztreonam 11/8 >> 11/11 Levofloxacin 11/11 >> 11/17  Microbiology results: 11/13 Resp Cx: too young to read 11/13: BCx x 2: NG <24 hrs 11/9 Resp Cx: normal respiratory flora  11/8 BCx: NG 5 days 11/8 UCx: NG 5 days 11/8 MRSA PCR: negative   Thank you for allowing pharmacy to be a part of this patient's care.  Emmaline LifeYang Vernona Peake, PharmD candidate  08/07/2018 1:39 PM

## 2018-08-08 ENCOUNTER — Inpatient Hospital Stay: Payer: BLUE CROSS/BLUE SHIELD

## 2018-08-08 DIAGNOSIS — I69921 Dysphasia following unspecified cerebrovascular disease: Secondary | ICD-10-CM

## 2018-08-08 LAB — BASIC METABOLIC PANEL
ANION GAP: 6 (ref 5–15)
BUN: 90 mg/dL — ABNORMAL HIGH (ref 6–20)
CO2: 27 mmol/L (ref 22–32)
Calcium: 8.2 mg/dL — ABNORMAL LOW (ref 8.9–10.3)
Chloride: 112 mmol/L — ABNORMAL HIGH (ref 98–111)
Creatinine, Ser: 3.13 mg/dL — ABNORMAL HIGH (ref 0.61–1.24)
GFR calc Af Amer: 24 mL/min — ABNORMAL LOW (ref >60)
GFR, EST NON AFRICAN AMERICAN: 21 mL/min — AB (ref >60)
GLUCOSE: 240 mg/dL — AB (ref 70–99)
POTASSIUM: 3.5 mmol/L (ref 3.5–5.1)
SODIUM: 145 mmol/L (ref 135–145)

## 2018-08-08 LAB — CBC
HCT: 29.9 % — ABNORMAL LOW (ref 39.0–52.0)
HEMOGLOBIN: 8.9 g/dL — AB (ref 13.0–17.0)
MCH: 26.7 pg (ref 26.0–34.0)
MCHC: 29.8 g/dL — ABNORMAL LOW (ref 30.0–36.0)
MCV: 89.8 fL (ref 80.0–100.0)
PLATELETS: 216 10*3/uL (ref 150–400)
RBC: 3.33 MIL/uL — AB (ref 4.22–5.81)
RDW: 16.5 % — ABNORMAL HIGH (ref 11.5–15.5)
WBC: 11.3 10*3/uL — ABNORMAL HIGH (ref 4.0–10.5)
nRBC: 0 % (ref 0.0–0.2)

## 2018-08-08 LAB — GLUCOSE, CAPILLARY
GLUCOSE-CAPILLARY: 167 mg/dL — AB (ref 70–99)
Glucose-Capillary: 108 mg/dL — ABNORMAL HIGH (ref 70–99)
Glucose-Capillary: 157 mg/dL — ABNORMAL HIGH (ref 70–99)
Glucose-Capillary: 166 mg/dL — ABNORMAL HIGH (ref 70–99)
Glucose-Capillary: 182 mg/dL — ABNORMAL HIGH (ref 70–99)
Glucose-Capillary: 182 mg/dL — ABNORMAL HIGH (ref 70–99)

## 2018-08-08 MED ORDER — INSULIN GLARGINE 100 UNIT/ML ~~LOC~~ SOLN
25.0000 [IU] | Freq: Two times a day (BID) | SUBCUTANEOUS | Status: DC
  Administered 2018-08-08 – 2018-08-11 (×6): 25 [IU] via SUBCUTANEOUS
  Filled 2018-08-08 (×7): qty 0.25

## 2018-08-08 MED ORDER — FENTANYL CITRATE (PF) 100 MCG/2ML IJ SOLN
50.0000 ug | INTRAMUSCULAR | Status: DC | PRN
  Administered 2018-08-10 – 2018-08-11 (×3): 50 ug via INTRAVENOUS
  Filled 2018-08-08 (×3): qty 2

## 2018-08-08 MED ORDER — POTASSIUM CHLORIDE 20 MEQ/15ML (10%) PO SOLN
20.0000 meq | Freq: Once | ORAL | Status: AC
  Administered 2018-08-08: 20 meq via ORAL
  Filled 2018-08-08 (×2): qty 15

## 2018-08-08 NOTE — Consult Note (Signed)
Midge Minium, MD [facility]  [address], [address] [place], [place] [address] Phone: [phone] Fax : [phone]  Consultation  Referring Provider:     Dr. Sherryll Burger Primary Care Physician:  Marina Goodell, MD Primary Gastroenterologist:  Dr. Servando Snare         Reason for Consultation:     PEG tube placement  Date of Admission:  08/07/2018 Date of Consultation:  08/08/2018         HPI:   Matthew Mayer is a 57 y.o. male who has a history of anoxic brain injury and recent decompensation.  The patient was able to eat before now but has not been able to eat.  The patient has a history of chronic kidney disease hypertension diabetes COPD with a chronic tracheostomy.  The patient was admitted to the ICU back on 11 8 with altered mental status and seizures after being found down by the family.  The patient was diagnosed with sepsis.  The patient has also had a AV fistula placed for hemodialysis by vascular surgery.  The patient has been on heparin for DVT prophylaxis.  Past Medical History:  Diagnosis Date  . Anemia   . Chronic kidney disease    stage 4  . COPD (chronic obstructive pulmonary disease) (HCC)   . Depression   . Diabetes (HCC)   . GERD (gastroesophageal reflux disease)   . Hyperlipidemia   . Hypertension   . MRSA (methicillin resistant Staphylococcus aureus)    pt states had in past  . Sleep apnea     Past Surgical History:  Procedure Laterality Date  . AV FISTULA INSERTION W/ RF MAGNETIC GUIDANCE Left 07/30/2018   Procedure: AV FISTULA INSERTION W/RF MAGNETIC GUIDANCE;  Surgeon: Renford Dills, MD;  Location: [facility] INVASIVE CV LAB;  Service: Cardiovascular;  Laterality: Left;  . CHOLECYSTECTOMY    . EXPLORATORY LAPAROTOMY    . Hand ganglion cyst Left   . laryngoscopy w/excision &/or aspiration  12/04/2013  . TRACHEOSTOMY      Prior to Admission medications   Medication Sig Start Date End Date Taking? Authorizing Provider  albuterol (ACCUNEB) 1.25 MG/3ML nebulizer  solution Inhale 1 ampule into the lungs every 6 (six) hours as needed for wheezing or shortness of breath.    Yes [provider]  arformoterol (BROVANA) 15 MCG/2ML NEBU Take 15 mcg by nebulization 2 (two) times daily.    Yes [provider]  calcitRIOL (ROCALTROL) 0.25 MCG capsule Take 0.25 mcg by mouth every evening.  04/15/18  Yes [provider]  carvedilol (COREG) 25 MG tablet Take 25 mg by mouth 2 (two) times daily with a meal.   Yes [provider]  clindamycin (CLEOCIN T) 1 % external solution Apply 1 application topically 2 (two) times daily as needed (rash).    Yes [provider]  Continuous Blood Gluc Sensor (FREESTYLE LIBRE SENSOR SYSTEM) MISC USE 1 EACH EVERY 10 (TEN) DAYS E11.65 02/05/18  Yes [provider]  cyclobenzaprine (FLEXERIL) 10 MG tablet Take 10 mg by mouth at bedtime.  12/09/17  Yes [provider]  docusate sodium (COLACE) 100 MG capsule Take 100 mg by mouth at bedtime.   Yes [provider]  esomeprazole (NEXIUM) 20 MG capsule Take 40 mg by mouth daily.    Yes [provider]  fenofibrate (TRICOR) 145 MG tablet Take 145 mg by mouth every evening.  12/06/17  Yes [provider]  furosemide (LASIX) 40 MG tablet Take 40  mg by mouth See admin instructions. Take 40 mg daily may take a second 40 mg dose as needed for swelling or weight gain 2lbs in 24 hours   Yes [provider]  HUMULIN R 500 UNIT/ML injection Inject 25 Units into the skin 3 (three) times daily with meals.    Yes [provider]  hydrocortisone valerate cream (WESTCORT) 0.2 % Apply 1 application topically 2 (two) times daily as needed (itching / rash).  10/15/17  Yes [provider]  hydrOXYzine (ATARAX/VISTARIL) 25 MG tablet Take 25 mg by mouth at bedtime.    Yes [provider]  lisinopril (PRINIVIL,ZESTRIL) 20 MG tablet Take 20 mg by mouth at bedtime.    Yes [provider]    Melatonin 10 MG TABS Take 20 mg by mouth at bedtime.   Yes [provider]  Multiple Vitamin (MULTIVITAMIN) tablet Take 1 tablet by mouth daily.   Yes [provider]  naproxen sodium (ALEVE) 220 MG tablet Take 220 mg by mouth 2 (two) times daily as needed (for pain).    Yes [provider]  oxyCODONE-acetaminophen (PERCOCET) 5-325 MG tablet Take 1-2 tablets by mouth every 6 (six) hours as needed for moderate pain or severe pain. 07/30/18 07/30/19 Yes Schnier, Latina CraverGregory G, MD  PARoxetine (PAXIL) 30 MG tablet Take 30 mg by mouth at bedtime.    Yes [provider]  promethazine (PHENERGAN) 25 MG tablet Take 25 mg by mouth every 8 (eight) hours as needed for nausea or vomiting.  01/30/17  Yes [provider]  Revefenacin 175 MCG/3ML SOLN Inhale 175 mcg into the lungs daily.   Yes [provider]  rosuvastatin (CRESTOR) 20 MG tablet Take 20 mg by mouth daily.  01/02/18  Yes [provider]  sodium chloride HYPERTONIC 3 % nebulizer solution Take 3 mLs by nebulization as needed for cough.   Yes [provider]  Vitamin D, Ergocalciferol, (DRISDOL) 50000 units CAPS capsule Take 50,000 Units by mouth every 30 (thirty) days.  12/09/17  Yes [provider]    Family History  Problem Relation Age of Onset  . Coronary artery disease Mother   . Hypertension Mother   . Osteoporosis Mother   . Heart attack Mother   . Diabetes Father   . Hypertension Father   . CVA Father   . Stroke Father   . Coronary artery disease Sister   . Diabetes Sister   . Hearing loss Sister   . Hyperlipidemia Sister   . Lung disease Sister   . Stroke Sister   . Colon polyps Brother   . Diabetes Brother   . Hearing loss Brother   . Hypertension Brother   . Cancer Maternal Grandmother   . Heart disease Maternal Grandfather   . Diabetes Paternal Grandmother   . Diabetes Paternal Grandfather      Social History   Tobacco Use  . Smoking status:  Former Smoker    Packs/day: 3.00    Years: 38.00    Pack years: 114.00    Types: Cigarettes    Last attempt to quit: 04/30/2013    Years since quitting: 5.2  . Smokeless tobacco: Never Used  Substance Use Topics  . Alcohol use: Not Currently  . Drug use: No    Allergies as of 08/15/2018 - Review Complete 08/02/2018  Allergen Reaction Noted  . Augmentin [amoxicillin-pot clavulanate] Anaphylaxis 07/28/2013  . Penicillins Anaphylaxis 07/28/2013  . Codeine Itching 07/28/2013  . Biaxin [clarithromycin] Rash 07/28/2013  Review of Systems:    All systems reviewed and negative except where noted in HPI.   Physical Exam:  Vital signs in last 24 hours: Temp:  [97.3 F (36.3 C)-98.8 F (37.1 C)] 97.3 F (36.3 C) (11/15 1200) Pulse Rate:  [64-75] 72 (11/15 1400) Resp:  [12-26] 15 (11/15 1400) BP: (92-176)/(31-138) 160/101 (11/15 1338) SpO2:  [96 %-100 %] 98 % (11/15 1400) FiO2 (%):  [35 %] 35 % (11/15 1217) Weight:  [113.6 kg] 113.6 kg (11/15 0403) Last BM Date: 08/05/18 General:   Intubated and unable to give any history Head: Patient with tracheostomy and nonresponsive. Eyes:   No icterus.   Conjunctiva pink. PERRLA. Ears: Unable to assess. Neck:  Supple; no masses or thyroidomegaly Lungs: Respirations even and unlabored. Lungs clear to auscultation bilaterally.   No wheezes, crackles, or rhonchi.  Heart:  Regular rate and rhythm;  Without murmur, clicks, rubs or gallops Abdomen:  Soft, obese, nondistended, nontender. Normal bowel sounds. No appreciable masses or hepatomegaly.  No rebound or guarding.  Rectal:  Not performed. Msk:  Symmetrical without gross deformities.    Extremities: Negative cyanosis without clubbing but positive pitting edema. Neurologic: Patient lethargic and unable to assess Skin:  Intact without significant lesions or rashes. Cervical Nodes:  No significant cervical adenopathy. Psych: Unable to assess.  LAB RESULTS: Recent Labs    08/06/18 0436  08/07/18 0414 08/08/18 0354  WBC 8.9 11.4* 11.3*  HGB 9.2* 9.3* 8.9*  HCT 31.2* 31.6* 29.9*  PLT 257 263 216   BMET Recent Labs    08/06/18 0436 08/07/18 0414 08/07/18 1101 08/08/18 0354  NA 150* 148*  --  145  K 3.5 3.4* 3.5 3.5  CL 114* 114*  --  112*  CO2 25 28  --  27  GLUCOSE 261* 242*  --  240*  BUN 90* 88*  --  90*  CREATININE 3.08* 3.00*  --  3.13*  CALCIUM 8.4* 8.4*  --  8.2*   LFT Recent Labs    08/07/18 0414  PROT 6.6  ALBUMIN 2.2*  AST 54*  ALT 29  ALKPHOS 159*  BILITOT 0.6   PT/INR No results for input(s): LABPROT, INR in the last 72 hours.  STUDIES: Mr Brain Wo Contrast  Result Date: 08/07/2018 CLINICAL DATA:  Altered mental status yesterday. Assess for anoxic brain injury. History of diabetes, hypertension and hyperlipidemia. EXAM: MRI HEAD WITHOUT CONTRAST TECHNIQUE: Multiplanar, multiecho pulse sequences of the brain and surrounding structures were obtained without intravenous contrast. COMPARISON:  CT HEAD November 11th 1,019 FINDINGS: Mild motion degraded examination. INTRACRANIAL CONTENTS: Faint symmetric bilateral hippocampi of reduced diffusion with low ADC values. No susceptibility artifact to suggest hemorrhage. The ventricles and sulci are normal for patient's age. RIGHT inferior cerebellar developmental venous anomaly no suspicious parenchymal signal, masses, mass effect. No abnormal extra-axial fluid collections. No extra-axial masses. VASCULAR: Normal major intracranial vascular flow voids present at skull base. SKULL AND UPPER CERVICAL SPINE: No abnormal sellar expansion. No suspicious calvarial bone marrow signal. Craniocervical junction maintained. SINUSES/ORBITS: The mastoid air-cells and included paranasal sinuses are well-aerated.The included ocular globes and orbital contents are non-suspicious. OTHER: None. IMPRESSION: 1. Mild motion degraded examination. Symmetric hippocampal signal abnormalities seen with hypoxic ischemic encephalopathy,  toxic encephalopathy (including hypoglycemia) or viral encephalopathy, seizures. 2. Otherwise negative noncontrast MRI head. 3. These results will be called to the ordering clinician or representative by the professional radiologist assistant, and communication documented in [employer] Dashboard. Electronically Signed   By: Pernell Dupre  Bloomer M.D.   On: 08/07/2018 00:34   US Venous Img Lower Bilateral  Result Date: 08/06/2018 CLINICAL DATA:  Bilateral lower extremity edema for 1 week. EXAM: BILATERAL LOWER EXTREMITY VENOUS DOPPLER ULTRASOUND TECHNIQUE: Gray-scale sonography with graded compression, as well as color Doppler and duplex ultrasound were performed to evaluate the lower extremity deep venous systems from the level of the common femoral vein and including the common femoral, femoral, profunda femoral, popliteal and calf veins including the posterior tibial, peroneal and gastrocnemius veins when visible. The superficial great saphenous vein was also interrogated. Spectral Doppler was utilized to evaluate flow at rest and with distal augmentation maneuvers in the common femoral, femoral and popliteal veins. COMPARISON:  None. FINDINGS: RIGHT LOWER EXTREMITY Common Femoral Vein: No evidence of thrombus. Normal compressibility, respiratory phasicity and response to augmentation. Saphenofemoral Junction: No evidence of thrombus. Normal compressibility and flow on color Doppler imaging. Profunda Femoral Vein: No evidence of thrombus. Normal compressibility and flow on color Doppler imaging. Femoral Vein: No evidence of thrombus. Normal compressibility, respiratory phasicity and response to augmentation. Popliteal Vein: No evidence of thrombus. Normal compressibility, respiratory phasicity and response to augmentation. Calf Veins: No evidence of thrombus. Normal compressibility and flow on color Doppler imaging. Superficial Great Saphenous Vein: No evidence of thrombus. Normal compressibility. Venous Reflux:   None. Other Findings: No evidence of superficial thrombophlebitis or abnormal fluid collection. LEFT LOWER EXTREMITY Common Femoral Vein: No evidence of thrombus. Normal compressibility, respiratory phasicity and response to augmentation. Saphenofemoral Junction: No evidence of thrombus. Normal compressibility and flow on color Doppler imaging. Profunda Femoral Vein: No evidence of thrombus. Normal compressibility and flow on color Doppler imaging. Femoral Vein: No evidence of thrombus. Normal compressibility, respiratory phasicity and response to augmentation. Popliteal Vein: No evidence of thrombus. Normal compressibility, respiratory phasicity and response to augmentation. Calf Veins: No evidence of thrombus. Normal compressibility and flow on color Doppler imaging. Superficial Great Saphenous Vein: No evidence of thrombus. Normal compressibility. Venous Reflux:  None. Other Findings: No evidence of superficial thrombophlebitis or abnormal fluid collection. IMPRESSION: No evidence of bilateral lower extremity deep venous thrombosis. Electronically Signed   By: Irish Lack M.D.   On: 08/06/2018 17:18   Dg Chest Port 1 View  Result Date: 08/08/2018 CLINICAL DATA:  Acute respiratory failure. EXAM: PORTABLE CHEST 1 VIEW COMPARISON:  08/07/2018.  08/05/2018. FINDINGS: Tracheostomy tube and NG tube in stable position. Cardiomegaly with mild pulmonary venous congestion. Mild bibasilar atelectasis. Stable pleural thickening. No pneumothorax. No acute bony abnormality. Chest is unchanged from prior exam. IMPRESSION: 1.  Lines and tubes stable position. 2. Cardiomegaly with mild pulmonary venous congestion. Mild bibasilar atelectasis. Similar findings to prior exam. Electronically Signed   By: Maisie Fus  Register   On: 08/08/2018 06:16   Dg Chest Port 1 View  Result Date: 08/07/2018 CLINICAL DATA:  Shortness of breath EXAM: PORTABLE CHEST 1 VIEW COMPARISON:  08/06/2018 FINDINGS: Cardiomegaly with vascular  congestion and bibasilar atelectasis. Low lung volumes. Tracheostomy and NG tube are unchanged. IMPRESSION: Low lung volumes with cardiomegaly, vascular congestion and bibasilar atelectasis. Findings are similar prior study. Electronically Signed   By: Charlett Nose M.D.   On: 08/07/2018 08:32      Impression / Plan:   Assessment: Active Problems:   Sepsis (HCC)   Acute respiratory failure (HCC)   Altered mental status   Hypertensive urgency   Acute encephalopathy   Palliative care by specialist   Goals of care, counseling/discussion   Hypoglycemia   Matthew Grove  Mayer is a 58 y.o. y/o male with altered mental status and thought to have a new baseline which does not hear that the patient will be able to feed by mouth in the future.  The patient did receive heparin for DVT prophylaxis.  Plan:  The patient will need to be off heparin and will be set up for a PEG tube placement by Dr. Tobi Bastos for Monday.  The procedure may be somewhat difficult with the patient's obesity and thick abdominal wall.  I am told the family would like everything done for the patient at this time including a feeding tube.  Thank you for involving me in the care of this patient.      LOS: 7 days   Midge Minium, MD  08/08/2018, 2:08 PM    Note: This dictation was prepared with Dragon dictation along with smaller phrase technology. Any transcriptional errors that result from this process are unintentional.

## 2018-08-08 NOTE — Progress Notes (Signed)
PT Cancellation Note  Patient Details Name: Matthew Mayer MRN: [id number] DOB: 29-Jan-1961   Cancelled Treatment:    Reason Eval/Treat Not Completed: Fatigue/lethargy limiting ability to participate(Evaluation re-attempted.  Patient remains generally lethargic and unable to arouse/participate.  Will re-attempt one additional date as medically appropriate and able to participate.)   Phung Kotas H. Manson PasseyBrown, PT, DPT, NCS 08/08/18, 1:54 PM [phone]

## 2018-08-08 NOTE — Progress Notes (Signed)
[place], [place] 08/08/18  Subjective:  Patient still not following commands. Still on trach collar. NG tube in place.   Objective:  Vital signs in last 24 hours:  Temp:  [97.5 F (36.4 C)-98.8 F (37.1 C)] 97.5 F (36.4 C) (11/15 0800) Pulse Rate:  [64-75] 74 (11/15 0907) Resp:  [12-26] 14 (11/15 0907) BP: (92-176)/(31-98) 170/98 (11/15 0800) SpO2:  [96 %-100 %] 99 % (11/15 0907) FiO2 (%):  [35 %] 35 % (11/15 0907) Weight:  [113.6 kg] 113.6 kg (11/15 0403)  Weight change: 1.7 kg Filed Weights   08/06/18 0500 08/07/18 0342 08/08/18 0403  Weight: 111.2 kg 111.9 kg 113.6 kg    Intake/Output:    Intake/Output Summary (Last 24 hours) at 08/08/2018 0911 Last data filed at 08/08/2018 7169 Gross per 24 hour  Intake 1179.41 ml  Output 1150 ml  Net 29.41 ml     Physical Exam: General:  Critically ill-appearing, laying in the bed  HEENT  NG in place  Neck  tracheostomy in place  Pulm/lungs  Scattered rhonchi  CVS/Heart  regular, no rub  Abdomen:   Soft, obese  Extremities:  No peripheral edema  Neurologic:  Did not respond to verbal or tactile stimuli  Skin:  Warm, dry  Access:  Left upper extremity developing new  AV fistula       Basic Metabolic Panel:  Recent Labs  Lab 08/02/18 1145 08/02/18 1728  08/03/18 0504 08/04/18 0729 08/05/18 0538 08/06/18 0436 08/07/18 0414 08/07/18 1101 08/08/18 0354  NA  --   --    < >  --  145 149* 150* 148*  --  145  K  --   --    < >  --  3.4* 3.9 3.5 3.4* 3.5 3.5  CL  --   --    < >  --  111 116* 114* 114*  --  112*  CO2  --   --    < >  --  _0 --  27  GLUCOSE  --   --    < >  --  283* 306* 261* 242*  --  240*  BUN  --   --    < >  --  63* 78* 90* 88*  --  90*  CREATININE  --   --    < >  --  3.44* 3.30* 3.08* 3.00*  --  3.13*  CALCIUM  --   --    < >  --  7.9* 8.3* 8.4* 8.4*  --  8.2*  MG 2.5* 2.1  --  2.2 2.0  --  2.5* 2.2  --   --   PHOS 5.5* 5.5*  --  3.5 3.7  --   --   3.0  --   --    < > = values in this interval not displayed.     CBC: Recent Labs  Lab 08/09/2018 1730  08/03/18 0504 08/05/18 0538 08/06/18 0436 08/07/18 0414 08/08/18 0354  WBC 18.4*   < > 11.4* 8.3 8.9 11.4* 11.3*  NEUTROABS 16.9*  --   --   --  5.9 7.7  --   HGB 9.7*   < > 8.7* 9.2* 9.2* 9.3* 8.9*  HCT 32.1*   < > 28.2* 31.0* 31.2* 31.6* 29.9*  MCV 86.5   < > 84.4 89.3 89.1 90.0 89.8  PLT 291   < > 286 267 257 263 216   < > =  values in this interval not displayed.     No results found for: HEPBSAG, HEPBSAB, HEPBIGM    Microbiology:  Recent Results (from the past 240 hour(s))  Urine Culture     Status: None   Collection Time: 07/28/2018  5:32 PM  Result Value Ref Range Status   Specimen Description   Final    URINE, RANDOM Performed at [facility], [address], [place], [place] [address]    Special Requests   Final    NONE Performed at [facility], [address], [place], [place] [address]    Culture   Final    NO GROWTH Performed at [facility], [place] [address], [place], [place] [address]    Report Status 08/02/2018 FINAL  Final  Blood culture (routine x 2)     Status: None   Collection Time: 08/18/2018  7:10 PM  Result Value Ref Range Status   Specimen Description BLOOD RIGHT ANTECUBITAL  Final   Special Requests   Final    BOTTLES DRAWN AEROBIC AND ANAEROBIC Blood Culture adequate volume   Culture   Final    NO GROWTH 5 DAYS Performed at [facility], [address] [place], [place] [address]    Report Status 08/06/2018 FINAL  Final  Blood culture (routine x 2)     Status: None   Collection Time: 08/09/2018  7:10 PM  Result Value Ref Range Status   Specimen Description BLOOD BLOOD RIGHT FOREARM  Final   Special Requests   Final    BOTTLES DRAWN AEROBIC AND ANAEROBIC Blood Culture adequate volume   Culture   Final    NO GROWTH 5 DAYS Performed at [facility], [address] [place], [place]  [address]    Report Status 08/06/2018 FINAL  Final  MRSA PCR Screening     Status: None   Collection Time: 08/02/2018  9:05 PM  Result Value Ref Range Status   MRSA by PCR NEGATIVE NEGATIVE Final    Comment:        The GeneXpert MRSA Assay (FDA approved for NASAL specimens only), is one component of a comprehensive MRSA colonization surveillance program. It is not intended to diagnose MRSA infection nor to guide or monitor treatment for MRSA infections. Performed at [facility], [address] [place], [place] [address]   Culture, respiratory (non-expectorated)     Status: None   Collection Time: 08/02/18  5:04 AM  Result Value Ref Range Status   Specimen Description   Final    TRACHEAL ASPIRATE Performed at [facility], [address], [place], [place] [address]    Special Requests   Final    NONE Performed at [facility], [place], [place] [address]    Gram Stain   Final    MODERATE SQUAMOUS EPITHELIAL CELLS PRESENT ABUNDANT WBC PRESENT, PREDOMINANTLY PMN FEW GRAM POSITIVE RODS RARE GRAM POSITIVE COCCI    Culture   Final    FEW Consistent with normal respiratory flora. Performed at [facility], [place] [address] [place], [place] [address]    Report Status 08/04/2018 FINAL  Final  CULTURE, BLOOD (ROUTINE X 2) w Reflex to ID Panel     Status: None (Preliminary result)   Collection Time: 08/06/18 10:34 AM  Result Value Ref Range Status   Specimen Description BLOOD BLOOD RIGHT HAND  Final   Special Requests   Final    BOTTLES DRAWN AEROBIC AND  ANAEROBIC Blood Culture adequate volume   Culture   Final    NO GROWTH 2 DAYS Performed at [facility], [address] [place], [place] [address]    Report Status PENDING  Incomplete  CULTURE, BLOOD (ROUTINE X 2) w Reflex to ID Panel     Status: None (Preliminary result)   Collection Time: 08/06/18 10:39 AM  Result Value Ref Range Status   Specimen Description BLOOD  BLOOD RIGHT WRIST  Final   Special Requests   Final    BOTTLES DRAWN AEROBIC AND ANAEROBIC Blood Culture adequate volume   Culture   Final    NO GROWTH 2 DAYS Performed at [facility], [address], [place], [place] [address]    Report Status PENDING  Incomplete  Culture, respiratory     Status: None (Preliminary result)   Collection Time: 08/06/18 11:23 AM  Result Value Ref Range Status   Specimen Description   Final    TRACHEAL ASPIRATE Performed at [facility], [address], [place], [place] [address]    Special Requests   Final    NONE Performed at [facility], [address] [place], [place] [address]    Gram Stain   Final    RARE WBC PRESENT, PREDOMINANTLY PMN RARE SQUAMOUS EPITHELIAL CELLS PRESENT ABUNDANT GRAM NEGATIVE RODS    Culture   Final    TOO YOUNG TO READ Performed at [facility], [place] [address], [place], [place] [address]    Report Status PENDING  Incomplete    Coagulation Studies: No results for input(s): LABPROT, INR in the last 72 hours.  Urinalysis: No results for input(s): COLORURINE, LABSPEC, PHURINE, GLUCOSEU, HGBUR, BILIRUBINUR, KETONESUR, PROTEINUR, UROBILINOGEN, NITRITE, LEUKOCYTESUR in the last 72 hours.  Invalid input(s): APPERANCEUR    Imaging: Mr Brain Wo Contrast  Result Date: 08/07/2018 CLINICAL DATA:  Altered mental status yesterday. Assess for anoxic brain injury. History of diabetes, hypertension and hyperlipidemia. EXAM: MRI HEAD WITHOUT CONTRAST TECHNIQUE: Multiplanar, multiecho pulse sequences of the brain and surrounding structures were obtained without intravenous contrast. COMPARISON:  CT HEAD November 11th 1,019 FINDINGS: Mild motion degraded examination. INTRACRANIAL CONTENTS: Faint symmetric bilateral hippocampi of reduced diffusion with low ADC values. No susceptibility artifact to suggest hemorrhage. The ventricles and sulci are normal for patient's age. RIGHT inferior cerebellar  developmental venous anomaly no suspicious parenchymal signal, masses, mass effect. No abnormal extra-axial fluid collections. No extra-axial masses. VASCULAR: Normal major intracranial vascular flow voids present at skull base. SKULL AND UPPER CERVICAL SPINE: No abnormal sellar expansion. No suspicious calvarial bone marrow signal. Craniocervical junction maintained. SINUSES/ORBITS: The mastoid air-cells and included paranasal sinuses are well-aerated.The included ocular globes and orbital contents are non-suspicious. OTHER: None. IMPRESSION: 1. Mild motion degraded examination. 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 2. Otherwise negative noncontrast MRI head. 3. These results will be called to the ordering clinician or representative by the professional radiologist assistant, and communication documented in [employer] Dashboard. Electronically Signed   By: Elon Alas M.D.   On: 08/07/2018 00:34   US Venous Img Lower Bilateral  Result Date: 08/06/2018 CLINICAL DATA:  Bilateral lower extremity edema for 1 week. EXAM: BILATERAL LOWER EXTREMITY VENOUS DOPPLER ULTRASOUND TECHNIQUE: Gray-scale sonography with graded compression, as well as color Doppler and duplex ultrasound were performed to evaluate the lower extremity deep venous systems from the level of the common femoral vein and including the common femoral,  femoral, profunda femoral, popliteal and calf veins including the posterior tibial, peroneal and gastrocnemius veins when visible. The superficial great saphenous vein was also interrogated. Spectral Doppler was utilized to evaluate flow at rest and with distal augmentation maneuvers in the common femoral, femoral and popliteal veins. COMPARISON:  None. FINDINGS: RIGHT LOWER EXTREMITY Common Femoral Vein: No evidence of thrombus. Normal compressibility, respiratory phasicity  and response to augmentation. Saphenofemoral Junction: No evidence of thrombus. Normal compressibility and flow on color Doppler imaging. Profunda Femoral Vein: No evidence of thrombus. Normal compressibility and flow on color Doppler imaging. Femoral Vein: No evidence of thrombus. Normal compressibility, respiratory phasicity and response to augmentation. Popliteal Vein: No evidence of thrombus. Normal compressibility, respiratory phasicity and response to augmentation. Calf Veins: No evidence of thrombus. Normal compressibility and flow on color Doppler imaging. Superficial Great Saphenous Vein: No evidence of thrombus. Normal compressibility. Venous Reflux:  None. Other Findings: No evidence of superficial thrombophlebitis or abnormal fluid collection. LEFT LOWER EXTREMITY Common Femoral Vein: No evidence of thrombus. Normal compressibility, respiratory phasicity and response to augmentation. Saphenofemoral Junction: No evidence of thrombus. Normal compressibility and flow on color Doppler imaging. Profunda Femoral Vein: No evidence of thrombus. Normal compressibility and flow on color Doppler imaging. Femoral Vein: No evidence of thrombus. Normal compressibility, respiratory phasicity and response to augmentation. Popliteal Vein: No evidence of thrombus. Normal compressibility, respiratory phasicity and response to augmentation. Calf Veins: No evidence of thrombus. Normal compressibility and flow on color Doppler imaging. Superficial Great Saphenous Vein: No evidence of thrombus. Normal compressibility. Venous Reflux:  None. Other Findings: No evidence of superficial thrombophlebitis or abnormal fluid collection. IMPRESSION: No evidence of bilateral lower extremity deep venous thrombosis. Electronically Signed   By: Aletta Edouard M.D.   On: 08/06/2018 17:18   Dg Chest Port 1 View  Result Date: 08/08/2018 CLINICAL DATA:  Acute respiratory failure. EXAM: PORTABLE CHEST 1 VIEW COMPARISON:  08/07/2018.   08/05/2018. FINDINGS: Tracheostomy tube and NG tube in stable position. Cardiomegaly with mild pulmonary venous congestion. Mild bibasilar atelectasis. Stable pleural thickening. No pneumothorax. No acute bony abnormality. Chest is unchanged from prior exam. IMPRESSION: 1.  Lines and tubes stable position. 2. Cardiomegaly with mild pulmonary venous congestion. Mild bibasilar atelectasis. Similar findings to prior exam. Electronically Signed   By: Marcello Moores  Register   On: 08/08/2018 06:16   Dg Chest Port 1 View  Result Date: 08/07/2018 CLINICAL DATA:  Shortness of breath EXAM: PORTABLE CHEST 1 VIEW COMPARISON:  08/06/2018 FINDINGS: Cardiomegaly with vascular congestion and bibasilar atelectasis. Low lung volumes. Tracheostomy and NG tube are unchanged. IMPRESSION: Low lung volumes with cardiomegaly, vascular congestion and bibasilar atelectasis. Findings are similar prior study. Electronically Signed   By: Rolm Baptise M.D.   On: 08/07/2018 08:32     Medications:   . dextrose 30 mL/hr at 08/08/18 0600  . feeding supplement (VITAL HIGH PROTEIN) 1,000 mL (08/08/18 0824)  . vancomycin Stopped (08/08/18 0231)   . amLODipine  10 mg Per NG tube Daily  . carvedilol  37.5 mg Per Tube BID WC  . chlorhexidine gluconate (MEDLINE KIT)  15 mL Mouth Rinse BID  . famotidine  20 mg Per Tube Daily  . feeding supplement (PRO-STAT SUGAR FREE 64)  30 mL Per Tube TID  . free water  200 mL Per Tube Q4H  . heparin  5,000 Units Subcutaneous Q8H  . hydrALAZINE  75 mg Per Tube Q8H  . insulin aspart  0-15 Units Subcutaneous Q4H  . insulin aspart  4 Units Subcutaneous Q4H  . insulin glargine  24 Units Subcutaneous BID  . isosorbide dinitrate  30 mg Per Tube BID  . levofloxacin  750 mg Per Tube Q48H  . lisinopril  40 mg Per Tube Daily  . mouth rinse  15 mL Mouth Rinse 10 times per day  . multivitamin  15 mL Per Tube Daily  . sodium chloride flush  3 mL Intravenous Q12H   acetaminophen **OR** acetaminophen,  bisacodyl, fentaNYL (SUBLIMAZE) injection, hydrALAZINE, ipratropium-albuterol, [DISCONTINUED] ondansetron **OR** ondansetron (ZOFRAN) IV, sennosides, sodium chloride flush  Assessment/ Plan:  57 y.o. 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  Acute renal failure on chronic kidney disease stage IV Baseline creatinine 3.13, GFR 21 from July 07, 2018 -Kidney function appears stable at the moment.  Creatinine 3.13.  Continue to monitor renal parameters periodically.  2.  Altered mental status Differential includes hypoglycemia, hypoxia, seizure Urine drug screen is positive for benzodiazepines -Responding only to painful stimuli.  PEG tube being considered.  3.  Diabetes type 2, insulin-dependent Poorly controlled Ischemic control as per critical care.  4. Acute respiratory failure.  Patient maintained on tracheostomy collar with high flow.  5.  Hypernatremia.  Serum sodium now down to 145.  Continue free water flush 200 cc every 4 hours as being administered.   LOS: 7 Tekelia Kareem 11/15/20199:11 AM  [place], [place]  Note: This note was prepared with Dragon dictation. Any transcription errors are unintentional

## 2018-08-08 NOTE — Progress Notes (Addendum)
Pt has a PEG tube procedure scheduled for Monday, 11/18. Writing RN tried to get the consent form signed by family member. Family refused signing and said they want to talk to the doctor regarding patient's status and then decide whether go ahead with the PEG procedure or not. [facility] She said she would talk to the family about it. Blank form saved in pt's folder.

## 2018-08-08 NOTE — Progress Notes (Signed)
Subjective: Patient opening eyes and grimaces to sternal rub.  He is still not following commands. No significant events noted overnight.  Objective: Current vital signs: BP 117/74   Pulse 70   Temp 98.8 F (37.1 C) (Axillary)   Resp 18   Ht 5' 8"  (1.727 m)   Wt 113.6 kg   SpO2 98%   BMI 38.08 kg/m  Vital signs in last 24 hours: Temp:  [97.8 F (36.6 C)-98.8 F (37.1 C)] 98.8 F (37.1 C) (11/15 0400) Pulse Rate:  [64-75] 70 (11/15 0600) Resp:  [12-26] 18 (11/15 0600) BP: (92-176)/(31-98) 117/74 (11/15 0600) SpO2:  [95 %-100 %] 98 % (11/15 0600) FiO2 (%):  [35 %] 35 % (11/14 1115) Weight:  [113.6 kg] 113.6 kg (11/15 0403)  Intake/Output from previous day: 11/14 0701 - 11/15 0700 In: 1119.4 [I.V.:719.1; IV Piggyback:400.3] Out: 1150 [Urine:1150] Intake/Output this shift: No intake/output data recorded. Nutritional status:  Diet Order    None     Neurological Exam  Mental Status: Opens eyes with deep sternal rub. Grimaces to noxious stimuli and localizes with the right upper extremity. Does not follow commands. Non-verbal Cranial Nerves: II: patient does not respond confrontation bilaterally, pupils right72m, left 325mand reactivebilaterally III,IV,VI: doll's responsepresent bilaterally.Resists head movement V,VII: corneal reflexpresentbilaterally VIII: patient does not respond to verbal stimuli IX,X: gag reflexreduced, XI: trapezius strength unable to test bilaterally XII: tongue strength unable to test Motor: Localizes pain with Right upper extremity Withdraws bilateral lower extremities to tactile stimuli Sensory: Responds to noxious stimuli   Lab Results: Basic Metabolic Panel: Recent Labs  Lab 08/02/18 1145 08/02/18 1728  08/03/18 0504 08/04/18 0729 08/05/18 0538 08/06/18 0436 08/07/18 0414 08/07/18 1101 08/08/18 0354  NA  --   --    < >  --  145 149* 150* 148*  --  145  K  --   --    < >  --  3.4* 3.9 3.5 3.4* 3.5 3.5  CL  --   --     < >  --  111 116* 114* 114*  --  112*  CO2  --   --    < >  --  24 26 25 28   --  27  GLUCOSE  --   --    < >  --  283* 306* 261* 242*  --  240*  BUN  --   --    < >  --  63* 78* 90* 88*  --  90*  CREATININE  --   --    < >  --  3.44* 3.30* 3.08* 3.00*  --  3.13*  CALCIUM  --   --    < >  --  7.9* 8.3* 8.4* 8.4*  --  8.2*  MG 2.5* 2.1  --  2.2 2.0  --  2.5* 2.2  --   --   PHOS 5.5* 5.5*  --  3.5 3.7  --   --  3.0  --   --    < > = values in this interval not displayed.    Liver Function Tests: Recent Labs  Lab 08/23/2018 1730 08/02/18 0356 08/04/18 0729 08/05/18 0538 08/07/18 0414  AST 30 22  --  39 54*  ALT 17 16  --  16 29  ALKPHOS 124 99  --  121 159*  BILITOT 0.7 1.0  --  0.7 0.6  PROT 7.7 6.8  --  6.9 6.6  ALBUMIN 2.5* 2.0* 2.0* 2.1* 2.2*  Recent Labs  Lab 08/16/2018 1730  LIPASE 35   Recent Labs  Lab 08/09/2018 1730  AMMONIA 19    CBC: Recent Labs  Lab 07/31/2018 1730  08/03/18 0504 08/05/18 0538 08/06/18 0436 08/07/18 0414 08/08/18 0354  WBC 18.4*   < > 11.4* 8.3 8.9 11.4* 11.3*  NEUTROABS 16.9*  --   --   --  5.9 7.7  --   HGB 9.7*   < > 8.7* 9.2* 9.2* 9.3* 8.9*  HCT 32.1*   < > 28.2* 31.0* 31.2* 31.6* 29.9*  MCV 86.5   < > 84.4 89.3 89.1 90.0 89.8  PLT 291   < > 286 267 257 263 216   < > = values in this interval not displayed.    Cardiac Enzymes: Recent Labs  Lab 08/06/2018 1730 08/02/18 0522  TROPONINI 0.03* 0.06*    Lipid Panel: No results for input(s): CHOL, TRIG, HDL, CHOLHDL, VLDL, LDLCALC in the last 168 hours.  CBG: Recent Labs  Lab 08/07/18 1556 08/07/18 1926 08/07/18 2322 08/08/18 0328 08/08/18 0757  GLUCAP 192* 194* 189* 167* 38*    Microbiology: Results for orders placed or performed during the hospital encounter of 07/25/2018  Urine Culture     Status: None   Collection Time: 08/15/2018  5:32 PM  Result Value Ref Range Status   Specimen Description   Final    URINE, RANDOM Performed at [facility], [address], [place], [place] [address]    Special Requests   Final    NONE Performed at [facility], [address], [place], [place] [address]    Culture   Final    NO GROWTH Performed at [facility], [place] [address] [place], [place] [address]    Report Status 08/02/2018 FINAL  Final  Blood culture (routine x 2)     Status: None   Collection Time: 08/18/2018  7:10 PM  Result Value Ref Range Status   Specimen Description BLOOD RIGHT ANTECUBITAL  Final   Special Requests   Final    BOTTLES DRAWN AEROBIC AND ANAEROBIC Blood Culture adequate volume   Culture   Final    NO GROWTH 5 DAYS Performed at [facility], [address] [place], [place] [address]    Report Status 08/06/2018 FINAL  Final  Blood culture (routine x 2)     Status: None   Collection Time: 08/09/2018  7:10 PM  Result Value Ref Range Status   Specimen Description BLOOD BLOOD RIGHT FOREARM  Final   Special Requests   Final    BOTTLES DRAWN AEROBIC AND ANAEROBIC Blood Culture adequate volume   Culture   Final    NO GROWTH 5 DAYS Performed at [facility], [address] [place], [place] [address]    Report Status 08/06/2018 FINAL  Final  MRSA PCR Screening     Status: None   Collection Time: 08/03/2018  9:05 PM  Result Value Ref Range Status   MRSA by PCR NEGATIVE NEGATIVE Final    Comment:        The GeneXpert MRSA Assay (FDA approved for NASAL specimens only), is one component of a comprehensive MRSA colonization surveillance program. It is not intended to diagnose MRSA infection nor to guide or monitor treatment for MRSA infections. Performed at [facility], [address], [place], [place] [address]   Culture, respiratory (non-expectorated)     Status: None   Collection Time: 08/02/18  5:04 AM  Result Value Ref Range Status   Specimen Description   Final    TRACHEAL ASPIRATE Performed at [facility], [address], [place], [place] [address]     Special Requests   Final    NONE Performed at [facility], [place], [place] [address]    Gram Stain   Final    MODERATE SQUAMOUS EPITHELIAL CELLS PRESENT ABUNDANT WBC PRESENT, PREDOMINANTLY PMN FEW GRAM POSITIVE RODS RARE GRAM POSITIVE COCCI    Culture   Final    FEW Consistent with normal respiratory flora. Performed at [facility], [place] [address], [place], [place] [address]    Report Status 08/04/2018 FINAL  Final  CULTURE, BLOOD (ROUTINE X 2) w Reflex to ID Panel     Status: None (Preliminary result)   Collection Time: 08/06/18 10:34 AM  Result Value Ref Range Status   Specimen Description BLOOD BLOOD RIGHT HAND  Final   Special Requests   Final    BOTTLES DRAWN AEROBIC AND ANAEROBIC Blood Culture adequate volume   Culture   Final    NO GROWTH 2 DAYS Performed at [facility], [address], [place], [place] [address]    Report Status PENDING  Incomplete  CULTURE, BLOOD (ROUTINE X 2) w Reflex to ID Panel     Status: None (Preliminary result)   Collection Time: 08/06/18 10:39 AM  Result Value Ref Range Status   Specimen Description BLOOD BLOOD RIGHT WRIST  Final   Special Requests   Final    BOTTLES DRAWN AEROBIC AND ANAEROBIC Blood Culture adequate volume   Culture   Final    NO GROWTH 2 DAYS Performed at [facility], [address], [place], [place] [address]    Report Status PENDING  Incomplete  Culture, respiratory     Status: None (Preliminary result)   Collection Time: 08/06/18 11:23 AM  Result Value Ref Range Status   Specimen Description   Final    TRACHEAL ASPIRATE Performed at [facility], [address], [place], [place] [address]    Special Requests   Final    NONE Performed at [facility], [address] [place], [place] [address]    Gram Stain   Final    RARE WBC PRESENT, PREDOMINANTLY PMN RARE SQUAMOUS EPITHELIAL CELLS PRESENT ABUNDANT GRAM NEGATIVE RODS    Culture    Final    TOO YOUNG TO READ Performed at [facility], [place] [address], [place], [place] [address]    Report Status PENDING  Incomplete    Coagulation Studies: No results for input(s): LABPROT, INR in the last 72 hours.  Imaging: Mr Brain Wo Contrast  Result Date: 08/07/2018 CLINICAL DATA:  Altered mental status yesterday. Assess for anoxic brain injury. History of diabetes, hypertension and hyperlipidemia. EXAM: MRI HEAD WITHOUT CONTRAST TECHNIQUE: Multiplanar, multiecho pulse sequences of the brain and surrounding structures were obtained without intravenous contrast. COMPARISON:  CT HEAD November 11th 1,019 FINDINGS: Mild motion degraded examination. INTRACRANIAL CONTENTS: Faint symmetric bilateral hippocampi of reduced diffusion with low ADC values. No susceptibility artifact to suggest hemorrhage. The ventricles and sulci are normal for patient's age. RIGHT inferior cerebellar developmental venous anomaly no suspicious parenchymal signal, masses, mass effect. No abnormal extra-axial fluid collections. No extra-axial masses. VASCULAR: Normal major intracranial vascular flow voids present at skull base. SKULL AND UPPER CERVICAL SPINE: No abnormal sellar expansion. No suspicious calvarial bone marrow signal. Craniocervical junction maintained. SINUSES/ORBITS: The mastoid  air-cells and included paranasal sinuses are well-aerated.The included ocular globes and orbital contents are non-suspicious. OTHER: None. IMPRESSION: 1. Mild motion degraded examination. 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 2. Otherwise negative noncontrast MRI head. 3. These results will be called to the ordering clinician or representative by the professional radiologist assistant, and communication documented in [employer] Dashboard. Electronically Signed   By: Elon Alas M.D.   On: 08/07/2018  00:34   US Venous Img Lower Bilateral  Result Date: 08/06/2018 CLINICAL DATA:  Bilateral lower extremity edema for 1 week. EXAM: BILATERAL LOWER EXTREMITY VENOUS DOPPLER ULTRASOUND TECHNIQUE: Gray-scale sonography with graded compression, as well as color Doppler and duplex ultrasound were performed to evaluate the lower extremity deep venous systems from the level of the common femoral vein and including the common femoral, femoral, profunda femoral, popliteal and calf veins including the posterior tibial, peroneal and gastrocnemius veins when visible. The superficial great saphenous vein was also interrogated. Spectral Doppler was utilized to evaluate flow at rest and with distal augmentation maneuvers in the common femoral, femoral and popliteal veins. COMPARISON:  None. FINDINGS: RIGHT LOWER EXTREMITY Common Femoral Vein: No evidence of thrombus. Normal compressibility, respiratory phasicity and response to augmentation. Saphenofemoral Junction: No evidence of thrombus. Normal compressibility and flow on color Doppler imaging. Profunda Femoral Vein: No evidence of thrombus. Normal compressibility and flow on color Doppler imaging. Femoral Vein: No evidence of thrombus. Normal compressibility, respiratory phasicity and response to augmentation. Popliteal Vein: No evidence of thrombus. Normal compressibility, respiratory phasicity and response to augmentation. Calf Veins: No evidence of thrombus. Normal compressibility and flow on color Doppler imaging. Superficial Great Saphenous Vein: No evidence of thrombus. Normal compressibility. Venous Reflux:  None. Other Findings: No evidence of superficial thrombophlebitis or abnormal fluid collection. LEFT LOWER EXTREMITY Common Femoral Vein: No evidence of thrombus. Normal compressibility, respiratory phasicity and response to augmentation. Saphenofemoral Junction: No evidence of thrombus. Normal compressibility and flow on color Doppler imaging. Profunda Femoral  Vein: No evidence of thrombus. Normal compressibility and flow on color Doppler imaging. Femoral Vein: No evidence of thrombus. Normal compressibility, respiratory phasicity and response to augmentation. Popliteal Vein: No evidence of thrombus. Normal compressibility, respiratory phasicity and response to augmentation. Calf Veins: No evidence of thrombus. Normal compressibility and flow on color Doppler imaging. Superficial Great Saphenous Vein: No evidence of thrombus. Normal compressibility. Venous Reflux:  None. Other Findings: No evidence of superficial thrombophlebitis or abnormal fluid collection. IMPRESSION: No evidence of bilateral lower extremity deep venous thrombosis. Electronically Signed   By: Aletta Edouard M.D.   On: 08/06/2018 17:18   Dg Chest Port 1 View  Result Date: 08/08/2018 CLINICAL DATA:  Acute respiratory failure. EXAM: PORTABLE CHEST 1 VIEW COMPARISON:  08/07/2018.  08/05/2018. FINDINGS: Tracheostomy tube and NG tube in stable position. Cardiomegaly with mild pulmonary venous congestion. Mild bibasilar atelectasis. Stable pleural thickening. No pneumothorax. No acute bony abnormality. Chest is unchanged from prior exam. IMPRESSION: 1.  Lines and tubes stable position. 2. Cardiomegaly with mild pulmonary venous congestion. Mild bibasilar atelectasis. Similar findings to prior exam. Electronically Signed   By: Marcello Moores  Register   On: 08/08/2018 06:16   Dg Chest Port 1 View  Result Date: 08/07/2018 CLINICAL DATA:  Shortness of breath EXAM: PORTABLE CHEST 1 VIEW COMPARISON:  08/06/2018 FINDINGS: Cardiomegaly with vascular congestion and bibasilar atelectasis. Low lung volumes. Tracheostomy and NG tube are unchanged. IMPRESSION: Low lung volumes with cardiomegaly, vascular congestion and  bibasilar atelectasis. Findings are similar prior study. Electronically Signed   By: Rolm Baptise M.D.   On: 08/07/2018 08:32    Medications:  I have reviewed the patient's current  medications. Prior to Admission:  Medications Prior to Admission  Medication Sig Dispense Refill Last Dose  . albuterol (ACCUNEB) 1.25 MG/3ML nebulizer solution Inhale 1 ampule into the lungs every 6 (six) hours as needed for wheezing or shortness of breath.    unknown at unknown  . arformoterol (BROVANA) 15 MCG/2ML NEBU Take 15 mcg by nebulization 2 (two) times daily.    unknown at unknown  . calcitRIOL (ROCALTROL) 0.25 MCG capsule Take 0.25 mcg by mouth every evening.   12 unknown at unknown  . carvedilol (COREG) 25 MG tablet Take 25 mg by mouth 2 (two) times daily with a meal.   unknown at unknown  . clindamycin (CLEOCIN T) 1 % external solution Apply 1 application topically 2 (two) times daily as needed (rash).    unknown at unknown  . Continuous Blood Gluc Sensor (FREESTYLE LIBRE SENSOR SYSTEM) MISC USE 1 EACH EVERY 10 (TEN) DAYS E11.65  11 07/29/2018 at Unknown time  . cyclobenzaprine (FLEXERIL) 10 MG tablet Take 10 mg by mouth at bedtime.   0 unknown at unknown  . docusate sodium (COLACE) 100 MG capsule Take 100 mg by mouth at bedtime.   unknown at unknown  . esomeprazole (NEXIUM) 20 MG capsule Take 40 mg by mouth daily.    unknown at unknown  . fenofibrate (TRICOR) 145 MG tablet Take 145 mg by mouth every evening.   0 unknown at unknown  . furosemide (LASIX) 40 MG tablet Take 40 mg by mouth See admin instructions. Take 40 mg daily may take a second 40 mg dose as needed for swelling or weight gain 2lbs in 24 hours   unknown at unknown  . HUMULIN R 500 UNIT/ML injection Inject 25 Units into the skin 3 (three) times daily with meals.   5 unknown at unknown  . hydrocortisone valerate cream (WESTCORT) 0.2 % Apply 1 application topically 2 (two) times daily as needed (itching / rash).    unknown at unknown  . hydrOXYzine (ATARAX/VISTARIL) 25 MG tablet Take 25 mg by mouth at bedtime.    unknown at unknown  . lisinopril (PRINIVIL,ZESTRIL) 20 MG tablet Take 20 mg by mouth at bedtime.    unknown at  unknown  . Melatonin 10 MG TABS Take 20 mg by mouth at bedtime.   unknown at unknown  . Multiple Vitamin (MULTIVITAMIN) tablet Take 1 tablet by mouth daily.   unknown at unknown  . naproxen sodium (ALEVE) 220 MG tablet Take 220 mg by mouth 2 (two) times daily as needed (for pain).    unknown at unknown  . oxyCODONE-acetaminophen (PERCOCET) 5-325 MG tablet Take 1-2 tablets by mouth every 6 (six) hours as needed for moderate pain or severe pain. 40 tablet 0 unknown at unknown  . PARoxetine (PAXIL) 30 MG tablet Take 30 mg by mouth at bedtime.    unknown at unknown  . promethazine (PHENERGAN) 25 MG tablet Take 25 mg by mouth every 8 (eight) hours as needed for nausea or vomiting.    unknown at unknown  . Revefenacin 175 MCG/3ML SOLN Inhale 175 mcg into the lungs daily.   unknown at unknown  . rosuvastatin (CRESTOR) 20 MG tablet Take 20 mg by mouth daily.   2 unknown at unknown  . sodium chloride HYPERTONIC 3 % nebulizer solution Take 3  mLs by nebulization as needed for cough.   unknown at unknown  . Vitamin D, Ergocalciferol, (DRISDOL) 50000 units CAPS capsule Take 50,000 Units by mouth every 30 (thirty) days.   6 unknown at unknown   Scheduled: . amLODipine  10 mg Per NG tube Daily  . carvedilol  37.5 mg Per Tube BID WC  . chlorhexidine gluconate (MEDLINE KIT)  15 mL Mouth Rinse BID  . famotidine  20 mg Per Tube Daily  . feeding supplement (PRO-STAT SUGAR FREE 64)  30 mL Per Tube TID  . free water  200 mL Per Tube Q4H  . heparin  5,000 Units Subcutaneous Q8H  . hydrALAZINE  75 mg Per Tube Q8H  . insulin aspart  0-15 Units Subcutaneous Q4H  . insulin aspart  4 Units Subcutaneous Q4H  . insulin glargine  24 Units Subcutaneous BID  . isosorbide dinitrate  30 mg Per Tube BID  . levofloxacin  750 mg Per Tube Q48H  . lisinopril  40 mg Per Tube Daily  . mouth rinse  15 mL Mouth Rinse 10 times per day  . multivitamin  15 mL Per Tube Daily  . sodium chloride flush  3 mL Intravenous Q12H      Assessment/Plan:  57 y.o male found unresponsive by family. Initially hypoglycemic with witnessed seizure-like activity and posturing. Patient is more awake and opens eyes but still unable to follow commands.  Work up to date unrevealing. Currently on Vanc and Levaquin for coverage of suspected infectious process due to fever of unknown etiology now improved. 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  Suspect hypoxic/hypoglycemic etiology likely.  With this in mind do not expect patient to return to baseline although will likely have some improvement from current clinical presentation.  Patient does show some mild improvement today.    Alexis Goodell, MD Neurology [phone]   LOS: 7 days   08/08/2018  8:40 AM

## 2018-08-08 NOTE — Progress Notes (Signed)
Pt sodium 145, CBG 182. Notified Dr. Sherryll BurgerShah regarding D5 infusion. D5 infusion of 6030ml/hr remained per Dr. Margaretmary EddyShah's order.

## 2018-08-08 NOTE — Progress Notes (Signed)
[facility] [place] [facility]   PATIENT NAME: Matthew ReadingDonald Mayer    MR#:  [id number]  DATE OF BIRTH:  07-14-1961  SUBJECTIVE:    Alert, but not communicative REVIEW OF SYSTEMS:   Review of Systems  Unable to perform ROS: Mental acuity   DRUG ALLERGIES:   Allergies  Allergen Reactions  . Augmentin [Amoxicillin-Pot Clavulanate] Anaphylaxis  . Penicillins Anaphylaxis    Has patient had a PCN reaction causing immediate rash, facial/tongue/throat swelling, SOB or lightheadedness with hypotension: Yes Has patient had a PCN reaction causing severe rash involving mucus membranes or skin necrosis: No Has patient had a PCN reaction that required hospitalization: Yes Has patient had a PCN reaction occurring within the last 10 years: Yes If all of the above answers are "NO", then may proceed with Cephalosporin use.   . Codeine Itching  . Biaxin [Clarithromycin] Rash    VITALS:  Blood pressure 128/68, pulse 67, temperature 98.2 F (36.8 C), temperature source Axillary, resp. rate 15, height 5\' 8"  (1.727 m), weight 113.6 kg, SpO2 99 %.  PHYSICAL EXAMINATION:   Physical Exam  GENERAL:  57 y.o.-year-old patient lying in the bed with no acute distress. Critically and chronically ill EYES: Pupils equal, round, reactive to light and accommodation. No scleral icterus. Extraocular muscles intact.  HEENT: Head atraumatic, normocephalic. Oropharynx and nasopharynx clear. Tracheostomy++ NECK:  Supple, no jugular venous distention. No thyroid enlargementTrach ++ LUNGS: Normal breath sounds bilaterally, no wheezing, rales, rhonchi. No use of accessory muscles of respiration. On Venti mask via Trach CARDIOVASCULAR: S1, S2 normal. No murmurs, rubs, or gallops.  ABDOMEN: Soft, nontender, nondistended. Bowel sounds present. No organomegaly or mass.  EXTREMITIES: No cyanosis, clubbing or edema b/l.    NEUROLOGIC: Alert, but non communicative or not responsive, does not make  eye contact. PSYCHIATRIC: on vent mask via trach, non communicative.  SKIN: No obvious rash, lesion, or ulcer.   LABORATORY PANEL:  CBC Recent Labs  Lab 08/08/18 0354  WBC 11.3*  HGB 8.9*  HCT 29.9*  PLT 216    Chemistries  Recent Labs  Lab 08/07/18 0414  08/08/18 0354  NA 148*  --  145  K 3.4*   < > 3.5  CL 114*  --  112*  CO2 28  --  27  GLUCOSE 242*  --  240*  BUN 88*  --  90*  CREATININE 3.00*  --  3.13*  CALCIUM 8.4*  --  8.2*  MG 2.2  --   --   AST 54*  --   --   ALT 29  --   --   ALKPHOS 159*  --   --   BILITOT 0.6  --   --    < > = values in this interval not displayed.   Cardiac Enzymes Recent Labs  Lab 08/02/18 0522  TROPONINI 0.06*   RADIOLOGY:  Mr Brain Wo Contrast  Result Date: 08/07/2018 CLINICAL DATA:  Altered mental status yesterday. Assess for anoxic brain injury. History of diabetes, hypertension and hyperlipidemia. EXAM: MRI HEAD WITHOUT CONTRAST TECHNIQUE: Multiplanar, multiecho pulse sequences of the brain and surrounding structures were obtained without intravenous contrast. COMPARISON:  CT HEAD November 11th 1,019 FINDINGS: Mild motion degraded examination. INTRACRANIAL CONTENTS: Faint symmetric bilateral hippocampi of reduced diffusion with low ADC values. No susceptibility artifact to suggest hemorrhage. The ventricles and sulci are normal for patient's age. RIGHT inferior cerebellar developmental venous anomaly no suspicious parenchymal signal, masses, mass effect. No abnormal  extra-axial fluid collections. No extra-axial masses. VASCULAR: Normal major intracranial vascular flow voids present at skull base. SKULL AND UPPER CERVICAL SPINE: No abnormal sellar expansion. No suspicious calvarial bone marrow signal. Craniocervical junction maintained. SINUSES/ORBITS: The mastoid air-cells and included paranasal sinuses are well-aerated.The included ocular globes and orbital contents are non-suspicious. OTHER: None. IMPRESSION: 1. Mild motion degraded  examination. 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 2. Otherwise negative noncontrast MRI head. 3. These results will be called to the ordering clinician or representative by the professional radiologist assistant, and communication documented in zVision Dashboard. Electronically Signed   By: Awilda Metro M.D.   On: 08/07/2018 00:34   Dg Chest Port 1 View  Result Date: 08/08/2018 CLINICAL DATA:  Acute respiratory failure. EXAM: PORTABLE CHEST 1 VIEW COMPARISON:  08/07/2018.  08/05/2018. FINDINGS: Tracheostomy tube and NG tube in stable position. Cardiomegaly with mild pulmonary venous congestion. Mild bibasilar atelectasis. Stable pleural thickening. No pneumothorax. No acute bony abnormality. Chest is unchanged from prior exam. IMPRESSION: 1.  Lines and tubes stable position. 2. Cardiomegaly with mild pulmonary venous congestion. Mild bibasilar atelectasis. Similar findings to prior exam. Electronically Signed   By: Maisie Fus  Register   On: 08/08/2018 06:16   Dg Chest Port 1 View  Result Date: 08/07/2018 CLINICAL DATA:  Shortness of breath EXAM: PORTABLE CHEST 1 VIEW COMPARISON:  08/06/2018 FINDINGS: Cardiomegaly with vascular congestion and bibasilar atelectasis. Low lung volumes. Tracheostomy and NG tube are unchanged. IMPRESSION: Low lung volumes with cardiomegaly, vascular congestion and bibasilar atelectasis. Findings are similar prior study. Electronically Signed   By: Charlett Nose M.D.   On: 08/07/2018 08:32   ASSESSMENT AND PLAN:  Matthew Mayer  is a 57 y.o. male with a known history of CKD stage IV, hypertension, diabetes mellitus, COPD with tracheostomy who had AV fistula surgery 2 days back presents to the hospital brought in by EMS after he was found unresponsive by family.  He was found to have blood glucose of 42 along with oxygen saturations in the 70s.   Unclear of how long patient was down for.  He was given dextrose and put on oxygen and brought to the emergency room.   * Severe sepsis with bilateral pneumonia and acute on chronic hypoxic respiratory failure with acute encephalopathy Patient is critically ill.  - Off ventilator now on oxygen via his tracheostomy since he was using accessory muscles and is unresponsive to respiratory failure. - No IV fluids due to pulmonary edema. - Broad-spectrum with Levaquin.  * Acute encephalopathy metabolic versus anoxic -seen by neurology. 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  * CKD stage IV.   - Consult nephrology if dialysis is needed. - Patient had fistula placement in preparation for hemodialysis two days ago. - Received nephrology input   * Hypoglycemia with diabetes mellitus.  - resolved  *Hypertension.  IV hydralazine prn  *DVT prophylaxis with heparin  Overall carries a poor prognosis  Case discussed with Care Management/Social Worker. Management plans discussed with the patient, family and they are in agreement.  CODE STATUS:full  DVT Prophylaxis: heparin  TOTAL TIME TAKING CARE OF THIS PATIENT: *32* minutes.  >50% time spent on counselling and coordination of care  POSSIBLE D/C IN few* DAYS, DEPENDING ON CLINICAL CONDITION.  Note: This dictation was prepared with Dragon dictation along with smaller phrase technology. Any transcriptional errors that result  from this process are unintentional.  Altamese DillingVaibhavkumar Nahara Dona M.D on 08/08/2018 at 11:38 PM  Between 7am to 6pm - Pager - [phone]  After 6pm go to [email or web address] - Social research officer, governmentpassword [email or web address] [facility]  [facility]  Office  [phone]  CC: Primary care physician; Marina GoodellFeldpausch, Dale E, MDPatient ID: Matthew Mayer, male   DOB: July 25, 1961, 57 y.o.   MRN: [id number]

## 2018-08-08 NOTE — Progress Notes (Signed)
Pt profile: 58 M with CKD, Htn, DM2, chronic trach tube adm 11/08 (2 days after LUE AVF placed) after being found unresponsive by family. Was initially hypoglycemic with seizure-like activity and posturing documented.  Did not improve with administration of dextrose.  CXR on admission worrisome for pneumonia versus pulmonary edema.  Tracheostomy tube changed out and mechanical ventilation initiated on day of admission.  08/07/2018: - 7600 Blood sugar is 166 Better from before BP is better today  - Patient is more awake opening his eyes but still not following commands -Spoke with patient's wife suggested that patient has a chronic trach, patient had a complicated course after gallbladder surgery which resulted in trach however patient was completely mobile and functional before this episode -Fever is improved - MRI reviewed showed hypoxic hypoglycemic injury to hippocampus-discussed with the patient's wife about prolonged recovery she still wants everything to be done however she did mention that patient does not wish to stay with artificial nutrition for long time -Also has hyponatremia with sodium of 145, currently on free water -No fever   Lines, Tubes, etc: Chronic trach tube  Microbiology: MRSA PCR 11/08 >> NEG Urine 11/08 >> NEG Resp 11/09 >> NOF  Blood 11/08 >>   Blood: 08/06/2018 Sputum 08/06/2018  Antibiotics: Currently on Vanco plus Levaquin-we will stop vancomycin on 08/08/2018, continue Levaquin for total 7 days Anti-infectives (From admission, onward)   Start     Dose/Rate Route Frequency Ordered Stop   08/07/18 0800  vancomycin (VANCOCIN) IVPB 1000 mg/200 mL premix  Status:  Discontinued     1,000 mg 200 mL/hr over 60 Minutes Intravenous Every 18 hours 08/06/18 1515 08/08/18 0949   08/06/18 1030  vancomycin (VANCOCIN) IVPB 1000 mg/200 mL premix     1,000 mg 200 mL/hr over 60 Minutes Intravenous  Once 08/06/18 1024 08/06/18 1441   08/04/18 1015  levofloxacin (LEVAQUIN)  tablet 750 mg     750 mg Per Tube Every 48 hours 08/04/18 1012 08/12/18 1014   08/04/18 0321  vancomycin (VANCOCIN) 1,500 mg in sodium chloride 0.9 % 500 mL IVPB  Status:  Discontinued     1,500 mg 250 mL/hr over 120 Minutes Intravenous Every 48 hours 08/02/18 1415 08/04/18 0849   08/02/18 0600  aztreonam (AZACTAM) injection 1 g  Status:  Discontinued     1 g Intramuscular Every 8 hours 08/14/2018 2011 08/02/18 0448   08/02/18 0600  aztreonam (AZACTAM) 1 g in sodium chloride 0.9 % 100 mL IVPB  Status:  Discontinued     1 g 200 mL/hr over 30 Minutes Intravenous Every 8 hours 08/02/18 0448 08/04/18 1012   08/02/18 0400  vancomycin (VANCOCIN) 1,500 mg in sodium chloride 0.9 % 500 mL IVPB  Status:  Discontinued     1,500 mg 250 mL/hr over 120 Minutes Intravenous Every 24 hours 07/27/2018 2011 08/02/18 1415   07/31/2018 1815  aztreonam (AZACTAM) 2 g in sodium chloride 0.9 % 100 mL IVPB     2 g 200 mL/hr over 30 Minutes Intravenous  Once 08/07/2018 1801 08/14/2018 1946   08/22/2018 1815  metroNIDAZOLE (FLAGYL) IVPB 500 mg  Status:  Discontinued     500 mg 100 mL/hr over 60 Minutes Intravenous Every 8 hours 08/21/2018 1801 08/02/18 1502   08/15/2018 1815  vancomycin (VANCOCIN) IVPB 1000 mg/200 mL premix     1,000 mg 200 mL/hr over 60 Minutes Intravenous  Once 07/28/2018 1801 08/21/2018 2120       Studies/Events: 11/08 CT head: No acute findings  11/09 EEG: 11/11 CT head: No acute findings 11/11 EEG: Metabolic encephalopathy 11/13 MRI: Symmetric hippocampal signal abnormalities seen with hypoxic ischemic encephalopathy, toxic encephalopathy (including hypoglycemia) 08/08/2018 GI consult for PEG tube placement placed  Consults:  11/08 ENT 11/09 Neurology   Best Practice: DVT: SQ heparin SUP: enteral famotidine Nutrition: TF protocol (11/10) Glycemic control: SSI protocol (11/11) Sedation/analgesia: none required -EEG showed metabolic encephalopathy  Obj: Vitals:   08/08/18 0800 08/08/18 0907   BP: (!) 170/98   Pulse: 71 74  Resp: 19 14  Temp: (!) 97.5 F (36.4 C)   SpO2: 99% 99%   Gen: Minimally responsive, Trach color HEENT: NCAT, sclerae white Neck: Trach site clean, JVP not visualized Chest: No wheezes, scattered rhonchi Cardiac: Regular, no M Abd: Obese, soft, no palpable masses, diminished BS Ext: Warm, symmetric edema Neuro: Grimaces to deep sternal rub.  Not following commands.  No spontaneous movement.  No withdrawal  BMP Latest Ref Rng & Units 08/08/2018 08/07/2018 08/07/2018  Glucose 70 - 99 mg/dL 161(W) - 960(A)  BUN 6 - 20 mg/dL 54(U) - 98(J)  Creatinine 0.61 - 1.24 mg/dL 1.91(Y) - 7.82(N)  BUN/Creat Ratio 9 - 20 - - -  Sodium 135 - 145 mmol/L 145 - 148(H)  Potassium 3.5 - 5.1 mmol/L 3.5 3.5 3.4(L)  Chloride 98 - 111 mmol/L 112(H) - 114(H)  CO2 22 - 32 mmol/L 27 - 28  Calcium 8.9 - 10.3 mg/dL 8.2(L) - 8.4(L)      CBC Latest Ref Rng & Units 08/08/2018 08/07/2018 08/06/2018  WBC 4.0 - 10.5 K/uL 11.3(H) 11.4(H) 8.9  Hemoglobin 13.0 - 17.0 g/dL 5.6(O) 1.3(Y) 8.6(V)  Hematocrit 39.0 - 52.0 % 29.9(L) 31.6(L) 31.2(L)  Platelets 150 - 400 K/uL 216 263 257      CXR: Cardiomegaly, RLL atelectasis versus infiltrate   IMPRESSION: 1) chronic tracheostomy tube 2) history of COPD 3) history of obstructive sleep apnea 4) history of hypertension 5) CKD, nonoliguric.  Creatinine approximately at baseline 6) acute on chronic hypoxemic respiratory failure 7) suspected pneumonia 8) pulmonary edema 9) hypertensive emergency, blood pressure still elevated will increase Coreg to 37.5 10) hypoglycemia, resolved 11) prolonged encephalopathy/obtundation-- Hippocampus injury suggest this might be new norm for patient  PLAN:  CV[facility]: Blood pressure is better today, Continue hydralazine to 75 every 8, Continue lisinopril to 30 daily, continue Norvasc 10, Continue  Coreg to 37.5, echo showed EF of 55 to 60% RS: Patient was placed on trach collar doing better, patient has  significant secretions will add chest PT, scopolamine patch was entertained however due to side effect will hold off for now if required will started - We will continue with that, ABG was checked and it was essentially okay - Patient is doing well on trach collar for last 48 to 72 hours, initially thought of transferring him out of ICU however due to secretions will continue to monitor in ICU for next 24 hours -- Aspiration precaution -- Spoke with neurology this might be his new norm, spoke with wife at length she is agreeable for PEG tube placement will ask GI consult  ID: Was treated with Vanco plus Azactam which was switched to Levaquin on 08/04/2018, all the cultures are negative however patient had a fever spike 11/12 --Repeat cultures were done-- Negative so far -Currently on  levaquin doing well give total 7 days and adjust with culture- vancomycin stopped on 08/08/2018 - DVT study was done and was negative  ENDO: Blood sugars better after adjustment of Lantus  to 25 units twice daily -- SSI monitor blood sugar GI: Tube feeding as tolerated currently on Protonix-spoke with GI we will plan to see him and decide on PEG tube placement RENAL: Renal consult appreciated currently not started on hemodialysis sodium is 145 creatinine is 3.13-improved with Lasix with potassium 3.5, will hold Lasix for today, will start patient on D5 water for elevated sodium along with free water flushes -- Electrolyte replacement protocol, Monitor Cr and K  CNS: EEG reviewed, shows diffuse metabolic encephalopathy, not responding much Neurology is following MRI shows some hypoxic injury -- This might be his new norm, spoke with wife and discuss with family  hEMATOLOGY: No acute issue-as per the family patient had some anemia and was getting worked up -- Monitor HB and PLT MUSCULOSKELETAL: No acute issue PAIN AND SEDATION: Currently not on any medication use PRN meds  Skin/Wound: Chronic changes  Electrolytes:  Replace electrolytes per ICU electrolyte replacement protocol.   IVF: D5 water 30  Nutrition: Tube feeds as tolerated  Prophylaxis: DVT Prophylaxis with heparin,. GI Prophylaxis.   Restraints: None  PT/OT eval and treat. OOB when appropriate.   Lines/Tubes: From 08/14/2018 Foley Got Midline RUE on 08/06/18 ADVANCE DIRECTIVE: Full code needs to address CODE STATUS with the patient's family. FAMILY DISCUSSION: Spoke with patient's 5 discussed patient's care at length Quality Care: PPI, DVT prophylaxis, HOB elevated, Infection control all reviewed and addressed. Events and notes from last 24 hours reviewed. Care plan discussed on multidisciplinary rounds CC TIME: 3232     Old records reviewed discussed results and management plan with patient  Images personally reviewed and results and labs reviewed and discussed with patient.  All medication reviewed and adjusted  Further management depending on test results and work up as outlined above.   Roseanne Renoutul Treylin Burtch, M.D

## 2018-08-08 NOTE — Progress Notes (Signed)
OT Cancellation Note  Patient Details Name: Matthew Mayer MRN: [id number] DOB: [birth year]   Cancelled Treatment:    Reason Eval/Treat Not Completed: Medical issues which prohibited therapy(Pt. is unresponsive, and unable to arouse to attend to or engage in therapy. at this time. Will continue to monitor, and eval when medically appropriate. Pt. was assisted with repositioining.)  Olegario MessierElaine [place] Doig, MS, OTR/L 08/08/2018, 11:22 AM

## 2018-08-08 NOTE — Progress Notes (Signed)
[facility] [place] [facility]   PATIENT NAME: Matthew ReadingDonald Olivo    MR#:  [id number]  DATE OF BIRTH:  07-14-1961  SUBJECTIVE:    Alert, but not communicative REVIEW OF SYSTEMS:   Review of Systems  Unable to perform ROS: Mental acuity   DRUG ALLERGIES:   Allergies  Allergen Reactions  . Augmentin [Amoxicillin-Pot Clavulanate] Anaphylaxis  . Penicillins Anaphylaxis    Has patient had a PCN reaction causing immediate rash, facial/tongue/throat swelling, SOB or lightheadedness with hypotension: Yes Has patient had a PCN reaction causing severe rash involving mucus membranes or skin necrosis: No Has patient had a PCN reaction that required hospitalization: Yes Has patient had a PCN reaction occurring within the last 10 years: Yes If all of the above answers are "NO", then may proceed with Cephalosporin use.   . Codeine Itching  . Biaxin [Clarithromycin] Rash    VITALS:  Blood pressure 128/68, pulse 67, temperature 98.2 F (36.8 C), temperature source Axillary, resp. rate 15, height 5\' 8"  (1.727 m), weight 113.6 kg, SpO2 99 %.  PHYSICAL EXAMINATION:   Physical Exam  GENERAL:  57 y.o.-year-old patient lying in the bed with no acute distress. Critically and chronically ill EYES: Pupils equal, round, reactive to light and accommodation. No scleral icterus. Extraocular muscles intact.  HEENT: Head atraumatic, normocephalic. Oropharynx and nasopharynx clear. Tracheostomy++ NECK:  Supple, no jugular venous distention. No thyroid enlargementTrach ++ LUNGS: Normal breath sounds bilaterally, no wheezing, rales, rhonchi. No use of accessory muscles of respiration. On Venti mask via Trach CARDIOVASCULAR: S1, S2 normal. No murmurs, rubs, or gallops.  ABDOMEN: Soft, nontender, nondistended. Bowel sounds present. No organomegaly or mass.  EXTREMITIES: No cyanosis, clubbing or edema b/l.    NEUROLOGIC: Alert, but non communicative or not responsive, does not make  eye contact. PSYCHIATRIC: on vent mask via trach, non communicative.  SKIN: No obvious rash, lesion, or ulcer.   LABORATORY PANEL:  CBC Recent Labs  Lab 08/08/18 0354  WBC 11.3*  HGB 8.9*  HCT 29.9*  PLT 216    Chemistries  Recent Labs  Lab 08/07/18 0414  08/08/18 0354  NA 148*  --  145  K 3.4*   < > 3.5  CL 114*  --  112*  CO2 28  --  27  GLUCOSE 242*  --  240*  BUN 88*  --  90*  CREATININE 3.00*  --  3.13*  CALCIUM 8.4*  --  8.2*  MG 2.2  --   --   AST 54*  --   --   ALT 29  --   --   ALKPHOS 159*  --   --   BILITOT 0.6  --   --    < > = values in this interval not displayed.   Cardiac Enzymes Recent Labs  Lab 08/02/18 0522  TROPONINI 0.06*   RADIOLOGY:  Mr Brain Wo Contrast  Result Date: 08/07/2018 CLINICAL DATA:  Altered mental status yesterday. Assess for anoxic brain injury. History of diabetes, hypertension and hyperlipidemia. EXAM: MRI HEAD WITHOUT CONTRAST TECHNIQUE: Multiplanar, multiecho pulse sequences of the brain and surrounding structures were obtained without intravenous contrast. COMPARISON:  CT HEAD November 11th 1,019 FINDINGS: Mild motion degraded examination. INTRACRANIAL CONTENTS: Faint symmetric bilateral hippocampi of reduced diffusion with low ADC values. No susceptibility artifact to suggest hemorrhage. The ventricles and sulci are normal for patient's age. RIGHT inferior cerebellar developmental venous anomaly no suspicious parenchymal signal, masses, mass effect. No abnormal  extra-axial fluid collections. No extra-axial masses. VASCULAR: Normal major intracranial vascular flow voids present at skull base. SKULL AND UPPER CERVICAL SPINE: No abnormal sellar expansion. No suspicious calvarial bone marrow signal. Craniocervical junction maintained. SINUSES/ORBITS: The mastoid air-cells and included paranasal sinuses are well-aerated.The included ocular globes and orbital contents are non-suspicious. OTHER: None. IMPRESSION: 1. Mild motion degraded  examination. 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 2. Otherwise negative noncontrast MRI head. 3. These results will be called to the ordering clinician or representative by the professional radiologist assistant, and communication documented in zVision Dashboard. Electronically Signed   By: Awilda Metro M.D.   On: 08/07/2018 00:34   Dg Chest Port 1 View  Result Date: 08/08/2018 CLINICAL DATA:  Acute respiratory failure. EXAM: PORTABLE CHEST 1 VIEW COMPARISON:  08/07/2018.  08/05/2018. FINDINGS: Tracheostomy tube and NG tube in stable position. Cardiomegaly with mild pulmonary venous congestion. Mild bibasilar atelectasis. Stable pleural thickening. No pneumothorax. No acute bony abnormality. Chest is unchanged from prior exam. IMPRESSION: 1.  Lines and tubes stable position. 2. Cardiomegaly with mild pulmonary venous congestion. Mild bibasilar atelectasis. Similar findings to prior exam. Electronically Signed   By: Maisie Fus  Register   On: 08/08/2018 06:16   Dg Chest Port 1 View  Result Date: 08/07/2018 CLINICAL DATA:  Shortness of breath EXAM: PORTABLE CHEST 1 VIEW COMPARISON:  08/06/2018 FINDINGS: Cardiomegaly with vascular congestion and bibasilar atelectasis. Low lung volumes. Tracheostomy and NG tube are unchanged. IMPRESSION: Low lung volumes with cardiomegaly, vascular congestion and bibasilar atelectasis. Findings are similar prior study. Electronically Signed   By: Charlett Nose M.D.   On: 08/07/2018 08:32   ASSESSMENT AND PLAN:  Malcom Selmer  is a 57 y.o. male with a known history of CKD stage IV, hypertension, diabetes mellitus, COPD with tracheostomy who had AV fistula surgery 2 days back presents to the hospital brought in by EMS after he was found unresponsive by family.  He was found to have blood glucose of 42 along with oxygen saturations in the 70s.   Unclear of how long patient was down for.  He was given dextrose and put on oxygen and brought to the emergency room.   * Severe sepsis with bilateral pneumonia and acute on chronic hypoxic respiratory failure with acute encephalopathy Patient is critically ill.  - Off ventilator now on oxygen via his tracheostomy since he was using accessory muscles and is unresponsive to respiratory failure. - No IV fluids due to pulmonary edema. - Broad-spectrum with Levaquin.  * Acute encephalopathy metabolic versus anoxic -seen by neurology. 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 - Plan is for PEG placement now.  * CKD stage IV.   - Consult nephrology if dialysis is needed. - Patient had fistula placement in preparation for hemodialysis two days ago. - Received nephrology input   * Hypoglycemia with diabetes mellitus.  - resolved  *Hypertension.  IV hydralazine prn  *DVT prophylaxis with heparin  Overall carries a poor prognosis  Case discussed with Care Management/Social Worker. Management plans discussed with the patient, family and they are in agreement.  CODE STATUS:full  DVT Prophylaxis: heparin  TOTAL TIME TAKING CARE OF THIS PATIENT: *32* minutes.  >50% time spent on counselling and coordination of care  POSSIBLE D/C IN few* DAYS, DEPENDING ON CLINICAL CONDITION.  Note: This dictation was prepared with Dragon dictation along with smaller  Lobbyist. Any transcriptional errors that result from this process are unintentional.  Altamese Dilling M.D on 08/08/2018 at 11:42 PM  Between 7am to 6pm - Pager - [phone]  After 6pm go to [email or web address] - Social research officer, government  [facility] [place] [facility]  Office  [phone]  CC: Primary care physician; Marina Goodell, MDPatient ID: [name], male   DOB: 10-02-1960, 57 y.o.   MRN: [id number]

## 2018-08-09 LAB — CULTURE, RESPIRATORY W GRAM STAIN

## 2018-08-09 LAB — BLOOD GAS, ARTERIAL
ACID-BASE EXCESS: 2.8 mmol/L — AB (ref 0.0–2.0)
Bicarbonate: 27.2 mmol/L (ref 20.0–28.0)
FIO2: 0.48
O2 Saturation: 97 %
PH ART: 7.44 (ref 7.350–7.450)
Patient temperature: 37
pCO2 arterial: 40 mmHg (ref 32.0–48.0)
pO2, Arterial: 87 mmHg (ref 83.0–108.0)

## 2018-08-09 LAB — BASIC METABOLIC PANEL
Anion gap: 10 (ref 5–15)
BUN: 105 mg/dL — AB (ref 6–20)
CO2: 25 mmol/L (ref 22–32)
Calcium: 8.2 mg/dL — ABNORMAL LOW (ref 8.9–10.3)
Chloride: 106 mmol/L (ref 98–111)
Creatinine, Ser: 3.36 mg/dL — ABNORMAL HIGH (ref 0.61–1.24)
GFR, EST AFRICAN AMERICAN: 22 mL/min — AB (ref >60)
GFR, EST NON AFRICAN AMERICAN: 19 mL/min — AB (ref >60)
Glucose, Bld: 237 mg/dL — ABNORMAL HIGH (ref 70–99)
POTASSIUM: 3.7 mmol/L (ref 3.5–5.1)
SODIUM: 141 mmol/L (ref 135–145)

## 2018-08-09 LAB — GLUCOSE, CAPILLARY
GLUCOSE-CAPILLARY: 166 mg/dL — AB (ref 70–99)
GLUCOSE-CAPILLARY: 163 mg/dL — AB (ref 70–99)
Glucose-Capillary: 156 mg/dL — ABNORMAL HIGH (ref 70–99)
Glucose-Capillary: 161 mg/dL — ABNORMAL HIGH (ref 70–99)
Glucose-Capillary: 149 mg/dL — ABNORMAL HIGH (ref 70–99)
Glucose-Capillary: 129 mg/dL — ABNORMAL HIGH (ref 70–99)

## 2018-08-09 LAB — CULTURE, RESPIRATORY

## 2018-08-09 NOTE — Progress Notes (Signed)
CRITICAL CARE NOTE  CC  follow up encephalopathy  SUBJECTIVE Patient remains critically ill Prognosis is guarded Chronic trach Severe encephlaopathy  Very poor prognosis     SIGNIFICANT EVENTS    BP (!) 117/51   Pulse (!) 59   Temp (!) 97.3 F (36.3 C) (Axillary)   Resp (!) 23   Ht 5\' 8"  (1.727 m)   Wt 113 kg   SpO2 95%   BMI 37.88 kg/m    REVIEW OF SYSTEMS  PATIENT IS UNABLE TO PROVIDE COMPLETE REVIEW OF SYSTEMS DUE TO SEVERE CRITICAL ILLNESS   PHYSICAL EXAMINATION:  GENERAL:critically ill appearing, +resp distress HEAD: Normocephalic, atraumatic.  EYES: Pupils equal, round, reactive to light.  No scleral icterus.  MOUTH: Moist mucosal membrane. NECK: Supple. No thyromegaly. No nodules. No JVD.  PULMONARY: +rhonchi, +wheezing CARDIOVASCULAR: S1 and S2. Regular rate and rhythm. No murmurs, rubs, or gallops.  GASTROINTESTINAL: Soft, nontender, -distended. No masses. Positive bowel sounds. No hepatosplenomegaly.  MUSCULOSKELETAL: No swelling, clubbing, or edema.  NEUROLOGIC: obtunded, GCS<8 SKIN:intact,warm,dry    ASSESSMENT AND PLAN SYNOPSIS 57 yo obese WM with severe encephalopathy and  severe anoxic brain injury With chronic resp failure and chronic trach with multiple medical issues with end stage kidney disease  Prognosis is very poor, needs trach and PEG tube to survive   Wean fio2 as tolerated  Renal Failure-end stage renal disease  NEUROLOGY Severe encephalopathy and anoxic brain injury Follow up Neuro recs  CARDIAC ICU monitoring   GI/Nutrition GI PROPHYLAXIS as indicated DIET-->TF's as tolerated PEG tub epending  ENDO - ICU hypoglycemic\Hyperglycemia protocol -check FSBS per protocol   ELECTROLYTES -follow labs as needed -replace as needed -pharmacy consultation and following   DVT/GI PRX ordered TRANSFUSIONS AS NEEDED MONITOR FSBS ASSESS the need for LABS as needed    Critical Care Time devoted to patient care  services described in this note is 35 minutes.   Overall, patient is critically ill, prognosis is guarded.  Patient with Multiorgan failure and at high risk for cardiac arrest and death.    Lucie LeatherKurian David Chantalle Defilippo, M.D.  [facility]  Medical Director [facility] Medical Director [facility]

## 2018-08-09 NOTE — Progress Notes (Deleted)
PT Cancellation Note  Patient Details Name: Matthew Mayer MRN: [id number] DOB: 11/21/1960   Cancelled Treatment:    Reason Eval/Treat Not Completed: Patient's level of consciousness(Evaluation attempted for third consecutive date.  Patient opens eyes briefly, but does not acknowledege, attend to or track therapist during session attempts.  Does not follow commands or make attempts to participate with sessioact assist ROM assessment.)  Christalynn Boise H. Manson PasseyBrown, PT, DPT, NCS 08/09/18, 9:53 AM [phone]

## 2018-08-09 NOTE — Progress Notes (Signed)
[facility] [place] [facility]   PATIENT NAME: Matthew ReadingDonald Mayer     MR#:  [id number]  DATE OF BIRTH:  August 12, 1961  SUBJECTIVE:  Patient remains critically ill  REVIEW OF SYSTEMS:  Unable to obtain tolerating  Diet: Tube feeds      DRUG ALLERGIES:   Allergies  Allergen Reactions  . Augmentin [Amoxicillin-Pot Clavulanate] Anaphylaxis  . Penicillins Anaphylaxis    Has patient had a PCN reaction causing immediate rash, facial/tongue/throat swelling, SOB or lightheadedness with hypotension: Yes Has patient had a PCN reaction causing severe rash involving mucus membranes or skin necrosis: No Has patient had a PCN reaction that required hospitalization: Yes Has patient had a PCN reaction occurring within the last 10 years: Yes If all of the above answers are "NO", then may proceed with Cephalosporin use.   . Codeine Itching  . Biaxin [Clarithromycin] Rash    VITALS:  Blood pressure (!) 98/53, pulse 65, temperature 98 F (36.7 C), temperature source Axillary, resp. rate 18, height 5\' 8"  (1.727 m), weight 113 kg, SpO2 99 %.  PHYSICAL EXAMINATION:  Constitutional: Critically ill-appearing HENT: Normocephalic. . Eyes: Conjunctivae are normal. PERRLA, no scleral icterus.  Neck: Normal ROM. Neck supple. No JVD. No tracheal deviation. Tracheostomy CVS: RRR, S1/S2 +, no murmurs, no gallops, no carotid bruit.  Pulmonary: Effort and breath sounds normal, no stridor, rhonchi, wheezes, rales.  Abdominal: Soft. BS +,  no distension, tenderness, rebound or guarding.  Musculoskeletal: Normal range of motion. No edema and no tenderness.  Neuro: Obtunded skin: Skin is warm and dry. No rash noted.    LABORATORY PANEL:   CBC Recent Labs  Lab 08/08/18 0354  WBC 11.3*  HGB 8.9*  HCT 29.9*  PLT 216   ------------------------------------------------------------------------------------------------------------------  Chemistries  Recent Labs  Lab 08/07/18 0414   08/09/18 0412  NA 148*   < > 141  K 3.4*   < > 3.7  CL 114*   < > 106  CO2 28   < > 25  GLUCOSE 242*   < > 237*  BUN 88*   < > 105*  CREATININE 3.00*   < > 3.36*  CALCIUM 8.4*   < > 8.2*  MG 2.2  --   --   AST 54*  --   --   ALT 29  --   --   ALKPHOS 159*  --   --   BILITOT 0.6  --   --    < > = values in this interval not displayed.   ------------------------------------------------------------------------------------------------------------------  Cardiac Enzymes No results for input(s): TROPONINI in the last 168 hours. ------------------------------------------------------------------------------------------------------------------  RADIOLOGY:  Dg Chest Port 1 View  Result Date: 08/08/2018 CLINICAL DATA:  Acute respiratory failure. EXAM: PORTABLE CHEST 1 VIEW COMPARISON:  08/07/2018.  08/05/2018. FINDINGS: Tracheostomy tube and NG tube in stable position. Cardiomegaly with mild pulmonary venous congestion. Mild bibasilar atelectasis. Stable pleural thickening. No pneumothorax. No acute bony abnormality. Chest is unchanged from prior exam. IMPRESSION: 1.  Lines and tubes stable position. 2. Cardiomegaly with mild pulmonary venous congestion. Mild bibasilar atelectasis. Similar findings to prior exam. Electronically Signed   By: Maisie Fushomas  Register   On: 08/08/2018 06:16     ASSESSMENT AND PLAN:   57 year old male with history of chronic kidney disease stage IV, diabetes and COPD status post tracheostomy who was brought to the ER after family found him unresponsive.  1.  Severe sepsis with bilateral pneumonia and acute on chronic hypoxic respiratory  failure due to acute encephalopathy from hypoglycemia Patient is off ventilator and now on oxygen via tracheostomy Plan for PEG on Monday   2.  Severe encephalopathy with anoxic brain injury: Neurology consultation appreciated EEG shows generalized slowing  3.  Chronic kidney disease stage IV: Nephrology consultation  appreciated Discontinue any nephrotoxic medications No indication for urgent dialysis as per nephrology at this time.  4.  Diabetes: Glycemic control as per intensivist   Palliative care consult appreciated  CODE STATUS: full  TOTAL TIME TAKING CARE OF THIS PATIENT: 20 minutes.     POSSIBLE D/C ??, DEPENDING ON CLINICAL CONDITION.   [name] M.D on 08/09/2018 at 12:22 PM  Between 7am to 6pm - Pager - [phone] After 6pm go to [email or web address] - password [email or web address]  [facility] [place] [facility]  Office  [phone]  CC: Primary care physician; Marina Goodell, MD  Note: This dictation was prepared with Dragon dictation along with smaller phrase technology. Any transcriptional errors that result from this process are unintentional.

## 2018-08-09 NOTE — Progress Notes (Signed)
OT Cancellation Note  Patient Details Name: Matthew Mayer MRN: [id number] DOB: 12-Apr-1961   Cancelled Treatment:    Reason Eval/Treat Not Completed: Patient not medically ready. Evaluation attempted for third consecutive date. Pt continues to be unable to follow commands, only opens eyes briefly, and unable to meaningfully participate in therapy. Pt inappropriate for OT evaluation at this time. Will complete initial order; please re-consult as alertness and ability to actively participate improves.  Of note, wife/family considering goals of care, deciding whether to proceed with PEG placement or transition to comfort care.  Richrd PrimeJamie Stiller, MPH, MS, OTR/L ascom [phone] 08/09/18, 11:18 AM

## 2018-08-09 NOTE — Progress Notes (Signed)
PT Cancellation Note  Patient Details Name: Matthew OharaDonald W Mayer MRN: [id number] DOB: 07-May-1961   Cancelled Treatment:    Reason Eval/Treat Not Completed: Patient's level of consciousness  (Evaluation attempted for third consecutive date.  Patient opens eyes briefly, but does not acknowledege, attend to or track therapist during session attempts.  Does not follow commands or make attempts to participate with session in any capacity.  Unable to participate with formal PT at this time.  As such, will complete initial order; please re-consult as alertness and ability to actively participate improves.  Of note, wife/family considering goals of care, deciding whether to proceed with PEG placement or transition to comfort care.)   Matthew Mayer, PT, DPT, NCS 08/09/18, 9:53 AM [phone]

## 2018-08-09 NOTE — Progress Notes (Signed)
Spoke with wife, [place] Lake[place]osa via telephone for 20 minutes regarding plan of care. Matthew Mayer is struggling with decision for feeding tube versus comfort measures. She reviewed Matthew Mayer's living will document yesterday and shares that he would "not want to be here in a vegetative state" or if he "wasn't aware of his surroundings." His living will has left the decision regarding artificial nutrition/hydration up to her, his HCPOA. She understands the "decision is looming" but again struggles with the thought of losing him. Also struggles with the thought of putting him through "another procedure." Rosa asks questions regarding comfort care/hospice. Matthew Mayer is very tearful throughout the conversation. "Known him 40 of my 55 years." Emotional/spiritual support provided. Matthew Mayer is hopeful to further talk with care team this weekend, including neurology before she makes decision to pursue PEG tube placement. PMT will f/u Monday, 11/18 and continue to support patient/family through hospitalization.    25 minutes   Vennie HomansMegan Bowen Kia, FNP-C Palliative Medicine Team  Phone: [phone] Fax: [phone]

## 2018-08-09 NOTE — Progress Notes (Signed)
[place], [place] 08/09/18  Subjective:  Patient seen at bedside. Creatinine up a bit to 3.3. Urine output was 430 cc over the preceding 24 hours.    Objective:  Vital signs in last 24 hours:  Temp:  [97.3 F (36.3 C)-98.2 F (36.8 C)] 98.2 F (36.8 C) (11/16 0800) Pulse Rate:  [58-75] 58 (11/16 0800) Resp:  [12-23] 19 (11/16 0800) BP: (97-160)/(35-106) 112/55 (11/16 0800) SpO2:  [94 %-100 %] 97 % (11/16 0800) FiO2 (%):  [35 %] 35 % (11/15 1936) Weight:  [409 kg] 113 kg (11/16 0415)  Weight change: -0.6 kg Filed Weights   08/07/18 0342 08/08/18 0403 08/09/18 0415  Weight: 111.9 kg 113.6 kg 113 kg    Intake/Output:    Intake/Output Summary (Last 24 hours) at 08/09/2018 0820 Last data filed at 08/09/2018 0800 Gross per 24 hour  Intake 1061.65 ml  Output 430 ml  Net 631.65 ml     Physical Exam: General:  Critically ill-appearing, laying in the bed  HEENT  NG in place  Neck  tracheostomy in place  Pulm/lungs  Scattered rhonchi  CVS/Heart  regular, no rub  Abdomen:   Soft, obese  Extremities:  No peripheral edema  Neurologic:  Did not respond to verbal or tactile stimuli  Skin:  Warm, dry  Access:  Left upper extremity developing new  AV fistula       Basic Metabolic Panel:  Recent Labs  Lab 08/02/18 1145 08/02/18 1728  08/03/18 0504 08/04/18 0729 08/05/18 0538 08/06/18 0436 08/07/18 0414 08/07/18 1101 08/08/18 0354 08/09/18 0412  NA  --   --    < >  --  145 149* 150* 148*  --  145 141  K  --   --    < >  --  3.4* 3.9 3.5 3.4* 3.5 3.5 3.7  CL  --   --    < >  --  111 116* 114* 114*  --  112* 106  CO2  --   --    < >  --  _0 --  27 25  GLUCOSE  --   --    < >  --  283* 306* 261* 242*  --  240* 237*  BUN  --   --    < >  --  63* 78* 90* 88*  --  90* 105*  CREATININE  --   --    < >  --  3.44* 3.30* 3.08* 3.00*  --  3.13* 3.36*  CALCIUM  --   --    < >  --  7.9* 8.3* 8.4* 8.4*  --  8.2* 8.2*  MG 2.5* 2.1  --   2.2 2.0  --  2.5* 2.2  --   --   --   PHOS 5.5* 5.5*  --  3.5 3.7  --   --  3.0  --   --   --    < > = values in this interval not displayed.     CBC: Recent Labs  Lab 08/03/18 0504 08/05/18 0538 08/06/18 0436 08/07/18 0414 08/08/18 0354  WBC 11.4* 8.3 8.9 11.4* 11.3*  NEUTROABS  --   --  5.9 7.7  --   HGB 8.7* 9.2* 9.2* 9.3* 8.9*  HCT 28.2* 31.0* 31.2* 31.6* 29.9*  MCV 84.4 89.3 89.1 90.0 89.8  PLT 286 267 257 263 216     No results found for: HEPBSAG, HEPBSAB, HEPBIGM  Microbiology:  Recent Results (from the past 240 hour(s))  Urine Culture     Status: None   Collection Time: 07/31/2018  5:32 PM  Result Value Ref Range Status   Specimen Description   Final    URINE, RANDOM Performed at [facility], [address] [place], [place] [address]    Special Requests   Final    NONE Performed at [facility], [address] [place], [place] [address]    Culture   Final    NO GROWTH Performed at [facility], [place] [address] [place], [place] [address]    Report Status 08/02/2018 FINAL  Final  Blood culture (routine x 2)     Status: None   Collection Time: 08/22/2018  7:10 PM  Result Value Ref Range Status   Specimen Description BLOOD RIGHT ANTECUBITAL  Final   Special Requests   Final    BOTTLES DRAWN AEROBIC AND ANAEROBIC Blood Culture adequate volume   Culture   Final    NO GROWTH 5 DAYS Performed at [facility], [address], [place], [place] [address]    Report Status 08/06/2018 FINAL  Final  Blood culture (routine x 2)     Status: None   Collection Time: 08/18/2018  7:10 PM  Result Value Ref Range Status   Specimen Description BLOOD BLOOD RIGHT FOREARM  Final   Special Requests   Final    BOTTLES DRAWN AEROBIC AND ANAEROBIC Blood Culture adequate volume   Culture   Final    NO GROWTH 5 DAYS Performed at [facility], [address], [place], [place] [address]    Report Status 08/06/2018 FINAL  Final   MRSA PCR Screening     Status: None   Collection Time: 08/19/2018  9:05 PM  Result Value Ref Range Status   MRSA by PCR NEGATIVE NEGATIVE Final    Comment:        The GeneXpert MRSA Assay (FDA approved for NASAL specimens only), is one component of a comprehensive MRSA colonization surveillance program. It is not intended to diagnose MRSA infection nor to guide or monitor treatment for MRSA infections. Performed at [facility], [address] [place], [place] [address]   Culture, respiratory (non-expectorated)     Status: None   Collection Time: 08/02/18  5:04 AM  Result Value Ref Range Status   Specimen Description   Final    TRACHEAL ASPIRATE Performed at [facility], [address], [place], [place] [address]    Special Requests   Final    NONE Performed at [facility], [place], [place] [address]    Gram Stain   Final    MODERATE SQUAMOUS EPITHELIAL CELLS PRESENT ABUNDANT WBC PRESENT, PREDOMINANTLY PMN FEW GRAM POSITIVE RODS RARE GRAM POSITIVE COCCI    Culture   Final    FEW Consistent with normal respiratory flora. Performed at [facility], [place] [address], [place], [place] [address]    Report Status 08/04/2018 FINAL  Final  CULTURE, BLOOD (ROUTINE X 2) w Reflex to ID Panel     Status: None (Preliminary result)   Collection Time: 08/06/18 10:34 AM  Result Value Ref Range Status   Specimen Description BLOOD BLOOD RIGHT HAND  Final   Special Requests   Final    BOTTLES DRAWN AEROBIC AND ANAEROBIC Blood Culture adequate volume   Culture   Final    NO GROWTH 2 DAYS Performed at  [facility], [address] [place], [place] [address]    Report Status PENDING  Incomplete  CULTURE, BLOOD (ROUTINE X 2) w Reflex to ID Panel     Status: None (Preliminary result)   Collection Time: 08/06/18 10:39 AM  Result Value Ref Range Status   Specimen Description BLOOD BLOOD RIGHT WRIST  Final   Special Requests    Final    BOTTLES DRAWN AEROBIC AND ANAEROBIC Blood Culture adequate volume   Culture   Final    NO GROWTH 2 DAYS Performed at [facility], [address], [place],  [place] [address]    Report Status PENDING  Incomplete  Culture, respiratory     Status: None (Preliminary result)   Collection Time: 08/06/18 11:23 AM  Result Value Ref Range Status   Specimen Description   Final    TRACHEAL ASPIRATE Performed at [facility], [address], [place], [place] [address]    Special Requests   Final    NONE Performed at [facility], [address] [place], [place] [address]    Gram Stain   Final    RARE WBC PRESENT, PREDOMINANTLY PMN RARE SQUAMOUS EPITHELIAL CELLS PRESENT ABUNDANT GRAM NEGATIVE RODS    Culture   Final    ABUNDANT STENOTROPHOMONAS MALTOPHILIA SUSCEPTIBILITIES TO FOLLOW Performed at [facility], [place] [address] [place], [place] [address]    Report Status PENDING  Incomplete    Coagulation Studies: No results for input(s): LABPROT, INR in the last 72 hours.  Urinalysis: No results for input(s): COLORURINE, LABSPEC, PHURINE, GLUCOSEU, HGBUR, BILIRUBINUR, KETONESUR, PROTEINUR, UROBILINOGEN, NITRITE, LEUKOCYTESUR in the last 72 hours.  Invalid input(s): APPERANCEUR    Imaging: Dg Chest Port 1 View  Result Date: 08/08/2018 CLINICAL DATA:  Acute respiratory failure. EXAM: PORTABLE CHEST 1 VIEW COMPARISON:  08/07/2018.  08/05/2018. FINDINGS: Tracheostomy tube and NG tube in stable position. Cardiomegaly with mild pulmonary venous congestion. Mild bibasilar atelectasis. Stable pleural thickening. No pneumothorax. No acute bony abnormality. Chest is unchanged from prior exam. IMPRESSION: 1.  Lines and tubes stable position. 2. Cardiomegaly with mild pulmonary venous congestion. Mild bibasilar atelectasis. Similar findings to prior exam. Electronically Signed   By: Dinosaur   On: 08/08/2018 06:16     Medications:   .  dextrose 30 mL/hr (08/09/18 0755)  . feeding supplement (VITAL HIGH PROTEIN) 1,000 mL (08/08/18 0824)   . amLODipine  10 mg Per NG tube Daily  . carvedilol  37.5 mg Per Tube BID WC  . chlorhexidine gluconate (MEDLINE KIT)  15 mL Mouth Rinse BID  . famotidine  20 mg Per Tube Daily  . feeding supplement (PRO-STAT SUGAR FREE 64)  30 mL Per Tube TID  . free water  200 mL Per Tube Q4H  . heparin  5,000 Units Subcutaneous Q8H  . hydrALAZINE  75 mg Per Tube Q8H  . insulin aspart  0-15 Units Subcutaneous Q4H  . insulin aspart  4 Units Subcutaneous Q4H  . insulin glargine  25 Units Subcutaneous BID  . isosorbide dinitrate  30 mg Per Tube BID  . levofloxacin  750 mg Per Tube Q48H  . lisinopril  40 mg Per Tube Daily  . mouth rinse  15 mL Mouth Rinse 10 times per day  . multivitamin  15 mL Per Tube Daily  . sodium chloride flush  3 mL Intravenous Q12H   acetaminophen **OR** acetaminophen, bisacodyl, fentaNYL (SUBLIMAZE) injection, hydrALAZINE, ipratropium-albuterol, [DISCONTINUED] ondansetron **OR** ondansetron (ZOFRAN) IV, sennosides,  sodium chloride flush  Assessment/ Plan:  57 y.o. 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  Acute renal failure on chronic kidney disease stage IV Baseline creatinine 3.13, GFR 21 from July 07, 2018 -Renal function worse over the preceding 24 hours.  BUN up to 105 with a creatinine of 3.3.  Urine output was only 430 cc over the preceding 24 hours.  Stop lisinopril at this time.  No urgent indication for HD at this time.  Pt would not make a good long term dialysis candidate at this time, he would likely not be able to sit up for outpt HD.    2.  Altered mental status Differential includes hypoglycemia, hypoxia, seizure Urine drug screen is  positive for benzodiazepines -Significant change in underlying mental status at the moment.  3.  Diabetes type 2, insulin-dependent Poorly controlled -Continue glycemic control as per critical care.  4. Acute respiratory failure.  Patient off the ventilator but still requiring high flow through his tracheostomy.  5.  Hypernatremia.  Serum sodium improved down to 141.   LOS: 8 Adelaide Pfefferkorn 11/16/20198:20 AM  [place], [place]  Note: This note was prepared with Dragon dictation. Any transcription errors are unintentional

## 2018-08-10 ENCOUNTER — Inpatient Hospital Stay: Payer: BLUE CROSS/BLUE SHIELD

## 2018-08-10 LAB — GLUCOSE, CAPILLARY
GLUCOSE-CAPILLARY: 150 mg/dL — AB (ref 70–99)
GLUCOSE-CAPILLARY: 117 mg/dL — AB (ref 70–99)
Glucose-Capillary: 101 mg/dL — ABNORMAL HIGH (ref 70–99)
Glucose-Capillary: 120 mg/dL — ABNORMAL HIGH (ref 70–99)
Glucose-Capillary: 128 mg/dL — ABNORMAL HIGH (ref 70–99)
Glucose-Capillary: 88 mg/dL (ref 70–99)

## 2018-08-10 LAB — COMPREHENSIVE METABOLIC PANEL
ALBUMIN: 2.4 g/dL — AB (ref 3.5–5.0)
ALT: 46 U/L — ABNORMAL HIGH (ref 0–44)
ANION GAP: 9 (ref 5–15)
AST: 78 U/L — ABNORMAL HIGH (ref 15–41)
Alkaline Phosphatase: 184 U/L — ABNORMAL HIGH (ref 38–126)
BILIRUBIN TOTAL: 0.6 mg/dL (ref 0.3–1.2)
BUN: 115 mg/dL — ABNORMAL HIGH (ref 6–20)
CALCIUM: 8.3 mg/dL — AB (ref 8.9–10.3)
CO2: 24 mmol/L (ref 22–32)
Chloride: 104 mmol/L (ref 98–111)
Creatinine, Ser: 3.88 mg/dL — ABNORMAL HIGH (ref 0.61–1.24)
GFR calc Af Amer: 18 mL/min — ABNORMAL LOW (ref >60)
GFR calc non Af Amer: 16 mL/min — ABNORMAL LOW (ref >60)
Glucose, Bld: 200 mg/dL — ABNORMAL HIGH (ref 70–99)
POTASSIUM: 3.4 mmol/L — AB (ref 3.5–5.1)
Sodium: 137 mmol/L (ref 135–145)
TOTAL PROTEIN: 6.1 g/dL — AB (ref 6.5–8.1)

## 2018-08-10 LAB — CBC
HEMATOCRIT: 26 % — AB (ref 39.0–52.0)
Hemoglobin: 8 g/dL — ABNORMAL LOW (ref 13.0–17.0)
MCH: 27.4 pg (ref 26.0–34.0)
MCHC: 30.8 g/dL (ref 30.0–36.0)
MCV: 89 fL (ref 80.0–100.0)
Platelets: 198 10*3/uL (ref 150–400)
RBC: 2.92 MIL/uL — ABNORMAL LOW (ref 4.22–5.81)
RDW: 16.5 % — ABNORMAL HIGH (ref 11.5–15.5)
WBC: 10.8 10*3/uL — AB (ref 4.0–10.5)
nRBC: 0 % (ref 0.0–0.2)

## 2018-08-10 LAB — PHOSPHORUS: Phosphorus: 4.6 mg/dL (ref 2.5–4.6)

## 2018-08-10 LAB — MAGNESIUM: MAGNESIUM: 2.1 mg/dL (ref 1.7–2.4)

## 2018-08-10 MED ORDER — ORAL CARE MOUTH RINSE
15.0000 mL | Freq: Two times a day (BID) | OROMUCOSAL | Status: DC
  Administered 2018-08-10 (×2): 15 mL via OROMUCOSAL

## 2018-08-10 MED ORDER — POTASSIUM CHLORIDE 20 MEQ PO PACK
60.0000 meq | PACK | Freq: Once | ORAL | Status: AC
  Administered 2018-08-10: 60 meq
  Filled 2018-08-10: qty 3

## 2018-08-10 MED ORDER — CHLORHEXIDINE GLUCONATE 0.12 % MT SOLN
15.0000 mL | Freq: Two times a day (BID) | OROMUCOSAL | Status: DC
  Administered 2018-08-10 – 2018-08-13 (×6): 15 mL via OROMUCOSAL
  Filled 2018-08-10 (×3): qty 15

## 2018-08-10 NOTE — Progress Notes (Signed)
[facility] [place] [facility]   PATIENT NAME: Matthew ReadingDonald Lober     MR#:  [id number]  DATE OF BIRTH:  10-24-1960  SUBJECTIVE:  Patient remains critically ill Encephalopathy persists REVIEW OF SYSTEMS:  Unable to obtain   tolerating  Diet: Tube feeds      DRUG ALLERGIES:   Allergies  Allergen Reactions  . Augmentin [Amoxicillin-Pot Clavulanate] Anaphylaxis  . Penicillins Anaphylaxis    Has patient had a PCN reaction causing immediate rash, facial/tongue/throat swelling, SOB or lightheadedness with hypotension: Yes Has patient had a PCN reaction causing severe rash involving mucus membranes or skin necrosis: No Has patient had a PCN reaction that required hospitalization: Yes Has patient had a PCN reaction occurring within the last 10 years: Yes If all of the above answers are "NO", then may proceed with Cephalosporin use.   . Codeine Itching  . Biaxin [Clarithromycin] Rash    VITALS:  Blood pressure (!) 123/57, pulse 62, temperature 98.1 F (36.7 C), temperature source Oral, resp. rate 11, height 5\' 8"  (1.727 m), weight 118 kg, SpO2 100 %.  PHYSICAL EXAMINATION:  Constitutional: Critically ill-appearing HENT: Normocephalic. . Eyes: Conjunctivae are normal. PERRLA, no scleral icterus.  Neck: Normal ROM. Neck supple. No JVD. No tracheal deviation. Tracheostomy CVS: RRR, S1/S2 +, no murmurs, no gallops, no carotid bruit.  Pulmonary: Effort and breath sounds normal, no stridor, rhonchi, wheezes, rales.  Abdominal: Soft. BS +,  no distension, tenderness, rebound or guarding.  Musculoskeletal: Normal range of motion. No edema and no tenderness.  Neuro: Obtunded skin: Skin is warm and dry. No rash noted.    LABORATORY PANEL:   CBC Recent Labs  Lab 08/10/18 0544  WBC 10.8*  HGB 8.0*  HCT 26.0*  PLT 198   ------------------------------------------------------------------------------------------------------------------  Chemistries  Recent  Labs  Lab 08/10/18 0544  NA 137  K 3.4*  CL 104  CO2 24  GLUCOSE 200*  BUN 115*  CREATININE 3.88*  CALCIUM 8.3*  MG 2.1  AST 78*  ALT 46*  ALKPHOS 184*  BILITOT 0.6   ------------------------------------------------------------------------------------------------------------------  Cardiac Enzymes No results for input(s): TROPONINI in the last 168 hours. ------------------------------------------------------------------------------------------------------------------  RADIOLOGY:  Dg Chest Port 1 View  Result Date: 08/10/2018 CLINICAL DATA:  Acute respiratory failure EXAM: PORTABLE CHEST 1 VIEW COMPARISON:  08/08/2018 FINDINGS: Cardiac shadow is enlarged. Tracheostomy tube and nasogastric catheter are again seen in satisfactory position. Lungs are well aerated with minimal bibasilar atelectatic changes. The vascular congestion has improved. No bony abnormality is seen. IMPRESSION: Improved vascular congestion. Bibasilar atelectasis. Electronically Signed   By: Alcide CleverMark  Lukens M.D.   On: 08/10/2018 07:32     ASSESSMENT AND PLAN:   57 year old male with history of chronic kidney disease stage IV, diabetes and COPD status post tracheostomy who was brought to the ER after family found him unresponsive.  1.  Severe sepsis with bilateral pneumonia and acute on chronic hypoxic respiratory failure due to acute encephalopathy from hypoglycemia Patient is off ventilator and now on oxygen via tracheostomy    2.  Severe encephalopathy with anoxic brain injury: Neurology consultation appreciated EEG shows generalized slowing  3.  Chronic kidney disease stage IV: Nephrology consultation appreciated Discontinue any nephrotoxic medications No indication for urgent dialysis as per nephrology at this time.  4.  Diabetes: Glycemic control as per intensivist  Patient with overall very poor prognosis and has severe brain injury. Wife to speak with neurology. Wife does not want PEG or  long-term facility placement  as per intensivist.  Most likely patient will transition to comfort care measures in the next few days. Palliative care consult appreciated  CODE STATUS: full  TOTAL TIME TAKING CARE OF THIS PATIENT: 18 minutes.     POSSIBLE D/C ??, DEPENDING ON CLINICAL CONDITION.   Melodee Lupe M.D on 08/10/2018 at 11:39 AM  Between 7am to 6pm - Pager - [phone] After 6pm go to [email or web address] - password [email or web address]  [facility]  Office  [phone]  CC: Primary care physician; Marina Goodell, MD  Note: This dictation was prepared with Dragon dictation along with smaller phrase technology. Any transcriptional errors that result from this process are unintentional.

## 2018-08-10 NOTE — Progress Notes (Signed)
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  EYES: Pupils equal, round, reactive to light.  No scleral icterus.  MOUTH: Moist mucosal membrane. NECK: Supple. No thyromegaly. No nodules. No JVD.  PULMONARY: +rhonchi, +wheezing CARDIOVASCULAR: S1 and S2. Regular rate and rhythm. No murmurs, rubs, or gallops.  GASTROINTESTINAL: Soft, nontender, -distended. No masses. Positive bowel sounds. No hepatosplenomegaly.  MUSCULOSKELETAL: No swelling, clubbing, or edema.  NEUROLOGIC: obtunded, GCS<8 SKIN:intact,warm,dry        ASSESSMENT AND PLAN SYNOPSIS  57 yo obese WM with severe encephalopathy and  severe anoxic brain injury With chronic resp failure and chronic trach with multiple medical issues with end stage kidney disease  Very poor prognosis, needs trach and PEG tube to survive  Severe Hypoxic and Hypercapnic Respiratory Failure Chronic trach Wean fio2 Vent support if needed  Renal Failure-most likely due to ATN -follow chem 7 -follow UO -continue Foley  Catheter-assess need daily Follow nephrology recs  NEUROLOGY Severe encephalopathy and severe anoxic brain injury   CARDIAC SD  Monitoring for now  GI/Nutrition GI PROPHYLAXIS as indicated DIET-->TF's as tolerated Constipation protocol as indicated Plan for PEG tube placement if family wishes to pursue aggressive measures  ENDO - ICU hypoglycemic\Hyperglycemia protocol -check FSBS per protocol   ELECTROLYTES -follow labs as needed -replace as needed -pharmacy consultation and following   DVT/GI PRX ordered TRANSFUSIONS AS NEEDED MONITOR FSBS ASSESS the need for LABS as needed   Critical Care Time devoted to patient care services described in this note is 37 minutes.   Overall, patient is critically ill, prognosis is guarded.  Patient with Multiorgan failure and at high risk for cardiac arrest and death.   Recommend DNR and  Recommend comfort care measures    Unable to reach Spouse  Lucie LeatherKurian David Corlette Ciano, M.D.  [facility]  Medical Director [facility] Medical Director [facility]

## 2018-08-10 NOTE — Progress Notes (Signed)
[facility] [place], [place] 08/10/18  Subjective:  Patient seen at bedside. Having secretions from his tracheostomy. Renal function worse with a creatinine of 3.9 today. Urine output was only 405 cc over the preceding 24 hours. Family considering comfort care.   Objective:  Vital signs in last 24 hours:  Temp:  [97.9 F (36.6 C)-98.1 F (36.7 C)] 98.1 F (36.7 C) (11/17 0742) Pulse Rate:  [56-72] 62 (11/17 1100) Resp:  [11-28] 11 (11/17 1100) BP: (78-147)/(36-104) 123/57 (11/17 1100) SpO2:  [88 %-100 %] 100 % (11/17 1100) FiO2 (%):  [35 %] 35 % (11/17 0742) Weight:  [782 kg] 118 kg (11/17 0500)  Weight change: 5 kg Filed Weights   08/08/18 0403 08/09/18 0415 08/10/18 0500  Weight: 113.6 kg 113 kg 118 kg    Intake/Output:    Intake/Output Summary (Last 24 hours) at 08/10/2018 1148 Last data filed at 08/10/2018 1100 Gross per 24 hour  Intake 8296.53 ml  Output 405 ml  Net 7891.53 ml     Physical Exam: General:  Critically ill-appearing, laying in the bed  HEENT  NG in place  Neck  tracheostomy in place  Pulm/lungs  Scattered rhonchi  CVS/Heart  regular, no rub  Abdomen:   Soft, obese  Extremities:  No peripheral edema  Neurologic:  Not following commands, awake  Skin:  Warm, dry  Access:  Left upper extremity developing new  AV fistula       Basic Metabolic Panel:  Recent Labs  Lab 08/04/18 0729  08/06/18 0436 08/07/18 0414 08/07/18 1101 08/08/18 0354 08/09/18 0412 08/10/18 0544  NA 145   < > 150* 148*  --  145 141 137  K 3.4*   < > 3.5 3.4* 3.5 3.5 3.7 3.4*  CL 111   < > 114* 114*  --  112* 106 104  CO2 24   < > 25 28  --  27 25 24   GLUCOSE 283*   < > 261* 242*  --  240* 237* 200*  BUN 63*   < > 90* 88*  --  90* 105* 115*  CREATININE 3.44*   < > 3.08* 3.00*  --  3.13* 3.36* 3.88*  CALCIUM 7.9*   < > 8.4* 8.4*  --  8.2* 8.2* 8.3*  MG 2.0  --  2.5* 2.2  --   --   --  2.1  PHOS 3.7  --   --  3.0  --   --   --  4.6   < > =  values in this interval not displayed.     CBC: Recent Labs  Lab 08/05/18 0538 08/06/18 0436 08/07/18 0414 08/08/18 0354 08/10/18 0544  WBC 8.3 8.9 11.4* 11.3* 10.8*  NEUTROABS  --  5.9 7.7  --   --   HGB 9.2* 9.2* 9.3* 8.9* 8.0*  HCT 31.0* 31.2* 31.6* 29.9* 26.0*  MCV 89.3 89.1 90.0 89.8 89.0  PLT 267 257 263 216 198     No results found for: HEPBSAG, HEPBSAB, HEPBIGM    Microbiology:  Recent Results (from the past 240 hour(s))  Urine Culture     Status: None   Collection Time: 08/18/2018  5:32 PM  Result Value Ref Range Status   Specimen Description   Final    URINE, RANDOM Performed at [facility], [address], [place], [place] [address]    Special Requests   Final    NONE Performed at [facility], [address]  [place], [place] [address]    Culture   Final    NO GROWTH Performed at [facility], [address] [place], [place] [address]    Report Status 08/02/2018 FINAL  Final  Blood culture (routine x 2)     Status: None   Collection Time: 07/30/2018  7:10 PM  Result Value Ref Range Status   Specimen Description BLOOD RIGHT ANTECUBITAL  Final   Special Requests   Final    BOTTLES DRAWN AEROBIC AND ANAEROBIC Blood Culture adequate volume   Culture   Final    NO GROWTH 5 DAYS Performed at [facility], [address] [place], [place] [address]    Report Status 08/06/2018 FINAL  Final  Blood culture (routine x 2)     Status: None   Collection Time: 08/20/2018  7:10 PM  Result Value Ref Range Status   Specimen Description BLOOD BLOOD RIGHT FOREARM  Final   Special Requests   Final    BOTTLES DRAWN AEROBIC AND ANAEROBIC Blood Culture adequate volume   Culture   Final    NO GROWTH 5 DAYS Performed at [facility], [address] [place], [place] [address]    Report Status 08/06/2018 FINAL  Final  MRSA PCR Screening     Status: None   Collection Time: 08/20/2018  9:05 PM  Result Value Ref Range Status    MRSA by PCR NEGATIVE NEGATIVE Final    Comment:        The GeneXpert MRSA Assay (FDA approved for NASAL specimens only), is one component of a comprehensive MRSA colonization surveillance program. It is not intended to diagnose MRSA infection nor to guide or monitor treatment for MRSA infections. Performed at [facility], [address] [place], [place] [address]   Culture, respiratory (non-expectorated)     Status: None   Collection Time: 08/02/18  5:04 AM  Result Value Ref Range Status   Specimen Description   Final    TRACHEAL ASPIRATE Performed at [facility], [address] [place], [place] [address]    Special Requests   Final    NONE Performed at [facility], [address] [place], [place] [address]    Gram Stain   Final    MODERATE SQUAMOUS EPITHELIAL CELLS PRESENT ABUNDANT WBC PRESENT, PREDOMINANTLY PMN FEW GRAM POSITIVE RODS RARE GRAM POSITIVE COCCI    Culture   Final    FEW Consistent with normal respiratory flora. Performed at [facility], [address] [place], [place] [address]    Report Status 08/04/2018 FINAL  Final  CULTURE, BLOOD (ROUTINE X 2) w Reflex to ID Panel     Status: None (Preliminary result)   Collection Time: 08/06/18 10:34 AM  Result Value Ref Range Status   Specimen Description BLOOD BLOOD RIGHT HAND  Final   Special Requests   Final    BOTTLES DRAWN AEROBIC AND ANAEROBIC Blood Culture adequate volume   Culture   Final    NO GROWTH 4 DAYS Performed at [facility], [address] [place], [place] [address]    Report Status PENDING  Incomplete  CULTURE, BLOOD (ROUTINE X 2) w Reflex to ID Panel     Status: None (Preliminary result)   Collection Time: 08/06/18 10:39 AM  Result Value Ref Range Status   Specimen Description BLOOD BLOOD RIGHT WRIST  Final   Special Requests   Final    BOTTLES DRAWN AEROBIC AND ANAEROBIC Blood Culture  adequate volume   Culture   Final    NO GROWTH 4  DAYS Performed at [facility], [address] [place], [place] [address]    Report Status PENDING  Incomplete  Culture, respiratory     Status: None   Collection Time: 08/06/18 11:23 AM  Result Value Ref Range Status   Specimen Description   Final    TRACHEAL ASPIRATE Performed at [facility], [address], [place], [place] [address]    Special Requests   Final    NONE Performed at [facility], [address] [place], [place] [address]    Gram Stain   Final    RARE WBC PRESENT, PREDOMINANTLY PMN RARE SQUAMOUS EPITHELIAL CELLS PRESENT ABUNDANT GRAM NEGATIVE RODS Performed at [facility], [address] [place], [place] [address]    Culture ABUNDANT STENOTROPHOMONAS MALTOPHILIA  Final   Report Status 08/09/2018 FINAL  Final   Organism ID, Bacteria STENOTROPHOMONAS MALTOPHILIA  Final      Susceptibility   Stenotrophomonas maltophilia - MIC*    LEVOFLOXACIN 1 SENSITIVE Sensitive     TRIMETH/SULFA <=20 SENSITIVE Sensitive     * ABUNDANT STENOTROPHOMONAS MALTOPHILIA    Coagulation Studies: No results for input(s): LABPROT, INR in the last 72 hours.  Urinalysis: No results for input(s): COLORURINE, LABSPEC, PHURINE, GLUCOSEU, HGBUR, BILIRUBINUR, KETONESUR, PROTEINUR, UROBILINOGEN, NITRITE, LEUKOCYTESUR in the last 72 hours.  Invalid input(s): APPERANCEUR    Imaging: Dg Chest Port 1 View  Result Date: 08/10/2018 CLINICAL DATA:  Acute respiratory failure EXAM: PORTABLE CHEST 1 VIEW COMPARISON:  08/08/2018 FINDINGS: Cardiac shadow is enlarged. Tracheostomy tube and nasogastric catheter are again seen in satisfactory position. Lungs are well aerated with minimal bibasilar atelectatic changes. The vascular congestion has improved. No bony abnormality is seen. IMPRESSION: Improved vascular congestion. Bibasilar atelectasis. Electronically Signed   By: Alcide Clever M.D.   On: 08/10/2018 07:32     Medications:   . dextrose 30 mL/hr at  08/10/18 0814  . feeding supplement (VITAL HIGH PROTEIN) 1,000 mL (08/10/18 0546)   . amLODipine  10 mg Per NG tube Daily  . carvedilol  37.5 mg Per Tube BID WC  . chlorhexidine  15 mL Mouth Rinse BID  . famotidine  20 mg Per Tube Daily  . feeding supplement (PRO-STAT SUGAR FREE 64)  30 mL Per Tube TID  . free water  200 mL Per Tube Q4H  . heparin  5,000 Units Subcutaneous Q8H  . hydrALAZINE  75 mg Per Tube Q8H  . insulin aspart  0-15 Units Subcutaneous Q4H  . insulin aspart  4 Units Subcutaneous Q4H  . insulin glargine  25 Units Subcutaneous BID  . isosorbide dinitrate  30 mg Per Tube BID  . mouth rinse  15 mL Mouth Rinse q12n4p  . multivitamin  15 mL Per Tube Daily  . sodium chloride flush  3 mL Intravenous Q12H   acetaminophen **OR** acetaminophen, bisacodyl, fentaNYL (SUBLIMAZE) injection, hydrALAZINE, ipratropium-albuterol, [DISCONTINUED] ondansetron **OR** ondansetron (ZOFRAN) IV, sennosides, sodium chloride flush  Assessment/ Plan:  57 y.o. 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  Acute renal failure on chronic kidney disease stage IV Baseline creatinine 3.13, GFR 21 from July 07, 2018 -Renal function continues to deteriorate.  Creatinine down to 3.18 with a BUN of 115 and urine output  was only 405 cc over the preceding 24 hours.  However in the patient's current state he would not make a good long-term hemodialysis candidate as he likely would not be able to sit up to perform outpatient dialysis.  They currently considering comfort care.  2.  Altered mental status Differential includes hypoglycemia, hypoxia, seizure Urine drug screen is positive for benzodiazepines - No significant change in mental status at this time.   3.  Diabetes type 2,  insulin-dependent Poorly controlled -Blood glucose remains high, currently BG 200, management per critical care.   4. Acute respiratory failure.  Appears to have increasing secretions.    5.  Hypernatremia.  Sodium down to 137 with free water flushes.    LOS: 9 Kathlean Cinco 11/17/201911:48 AM  [facility] [place], [place] [phone]  Note: This note was prepared with Dragon dictation. Any transcription errors are unintentional

## 2018-08-10 NOTE — Progress Notes (Signed)
I have talked with Wife Rosa I have explained to her that patient seems to have severe brain injury and damage He is non-verbal unresponsive, increased WOB, increased secretions.  He will most liklely need long term placement and facility  I have explained that he will need PEG tube for long term survival  At this time, she DOES NOT want PEG tube placement and does NOT want long term facility or placement  I discussed and explained to the  that patient will need palliative care services and comfort care measures  She was tearful and understands her husbands grave condition. I believe that she will likely pursue comfort care measures in next several days after she she has discussion with Neurologist.      Lucie LeatherKurian David Camp Gopal, M.D.  [facility]  Medical Director [facility] Medical Director [facility] Cardio-Pulmonary Department

## 2018-08-11 ENCOUNTER — Inpatient Hospital Stay: Payer: BLUE CROSS/BLUE SHIELD

## 2018-08-11 ENCOUNTER — Encounter: Admission: EM | Disposition: E | Payer: Self-pay | Source: Home / Self Care | Attending: Internal Medicine

## 2018-08-11 DIAGNOSIS — E162 Hypoglycemia, unspecified: Secondary | ICD-10-CM

## 2018-08-11 DIAGNOSIS — I10 Essential (primary) hypertension: Secondary | ICD-10-CM

## 2018-08-11 DIAGNOSIS — A419 Sepsis, unspecified organism: Principal | ICD-10-CM

## 2018-08-11 DIAGNOSIS — Z515 Encounter for palliative care: Secondary | ICD-10-CM

## 2018-08-11 DIAGNOSIS — Z66 Do not resuscitate: Secondary | ICD-10-CM

## 2018-08-11 DIAGNOSIS — R601 Generalized edema: Secondary | ICD-10-CM

## 2018-08-11 DIAGNOSIS — Z7189 Other specified counseling: Secondary | ICD-10-CM

## 2018-08-11 LAB — COMPREHENSIVE METABOLIC PANEL WITH GFR
ALT: 43 U/L (ref 0–44)
AST: 51 U/L — ABNORMAL HIGH (ref 15–41)
Albumin: 2.5 g/dL — ABNORMAL LOW (ref 3.5–5.0)
Alkaline Phosphatase: 197 U/L — ABNORMAL HIGH (ref 38–126)
Anion gap: 11 (ref 5–15)
BUN: 135 mg/dL — ABNORMAL HIGH (ref 6–20)
CO2: 23 mmol/L (ref 22–32)
Calcium: 8.3 mg/dL — ABNORMAL LOW (ref 8.9–10.3)
Chloride: 102 mmol/L (ref 98–111)
Creatinine, Ser: 4.15 mg/dL — ABNORMAL HIGH (ref 0.61–1.24)
GFR calc Af Amer: 17 mL/min — ABNORMAL LOW
GFR calc non Af Amer: 15 mL/min — ABNORMAL LOW
Glucose, Bld: 143 mg/dL — ABNORMAL HIGH (ref 70–99)
Potassium: 4.4 mmol/L (ref 3.5–5.1)
Sodium: 136 mmol/L (ref 135–145)
Total Bilirubin: 0.7 mg/dL (ref 0.3–1.2)
Total Protein: 6.5 g/dL (ref 6.5–8.1)

## 2018-08-11 LAB — CULTURE, BLOOD (ROUTINE X 2)
CULTURE: NO GROWTH
CULTURE: NO GROWTH
SPECIAL REQUESTS: ADEQUATE
Special Requests: ADEQUATE

## 2018-08-11 LAB — CBC
HCT: 27.2 % — ABNORMAL LOW (ref 39.0–52.0)
Hemoglobin: 8.5 g/dL — ABNORMAL LOW (ref 13.0–17.0)
MCH: 27.2 pg (ref 26.0–34.0)
MCHC: 31.3 g/dL (ref 30.0–36.0)
MCV: 86.9 fL (ref 80.0–100.0)
NRBC: 0 % (ref 0.0–0.2)
PLATELETS: 226 10*3/uL (ref 150–400)
RBC: 3.13 MIL/uL — ABNORMAL LOW (ref 4.22–5.81)
RDW: 16.6 % — AB (ref 11.5–15.5)
WBC: 10.6 10*3/uL — AB (ref 4.0–10.5)

## 2018-08-11 LAB — GLUCOSE, CAPILLARY
Glucose-Capillary: 90 mg/dL (ref 70–99)
Glucose-Capillary: 148 mg/dL — ABNORMAL HIGH (ref 70–99)
Glucose-Capillary: 197 mg/dL — ABNORMAL HIGH (ref 70–99)

## 2018-08-11 LAB — MAGNESIUM: Magnesium: 2.1 mg/dL (ref 1.7–2.4)

## 2018-08-11 LAB — PHOSPHORUS: PHOSPHORUS: 5.3 mg/dL — AB (ref 2.5–4.6)

## 2018-08-11 SURGERY — INSERTION, PEG TUBE
Anesthesia: General

## 2018-08-11 MED ORDER — LORAZEPAM 2 MG/ML IJ SOLN
1.0000 mg | INTRAMUSCULAR | Status: DC | PRN
  Administered 2018-08-11: 20:00:00 1 mg via INTRAVENOUS
  Administered 2018-08-12 – 2018-08-13 (×6): 2 mg via INTRAVENOUS
  Filled 2018-08-11 (×7): qty 1

## 2018-08-11 MED ORDER — BIOTENE DRY MOUTH MT LIQD: 15.0000 mL | OROMUCOSAL | Status: DC | PRN

## 2018-08-11 MED ORDER — POLYVINYL ALCOHOL 1.4 % OP SOLN
1.0000 [drp] | Freq: Four times a day (QID) | OPHTHALMIC | Status: DC | PRN
  Filled 2018-08-11: qty 15

## 2018-08-11 MED ORDER — FENTANYL CITRATE (PF) 100 MCG/2ML IJ SOLN
50.0000 ug | INTRAMUSCULAR | Status: DC | PRN
  Administered 2018-08-11 – 2018-08-12 (×3): 50 ug via INTRAVENOUS
  Filled 2018-08-11 (×4): qty 2

## 2018-08-11 MED ORDER — GLYCOPYRROLATE 0.2 MG/ML IJ SOLN
0.2000 mg | INTRAMUSCULAR | Status: DC | PRN
  Administered 2018-08-11 – 2018-08-12 (×3): 0.2 mg via INTRAVENOUS
  Filled 2018-08-11 (×4): qty 1

## 2018-08-11 MED ORDER — LORAZEPAM 2 MG/ML PO CONC
1.0000 mg | ORAL | Status: DC | PRN
  Filled 2018-08-11: qty 1

## 2018-08-11 NOTE — Progress Notes (Signed)
[employer] [facility] [place] [facility]   PATIENT NAME: Matthew Mayer     MR#:  [id number]  DATE OF BIRTH:  1961/06/01  SUBJECTIVE:  Family at bedside.  Encephalopathy persists.  Family is discussing possible comfort care measures.   REVIEW OF SYSTEMS:  Unable to obtain    Diet: Tube feeds      DRUG ALLERGIES:   Allergies  Allergen Reactions  . Augmentin [Amoxicillin-Pot Clavulanate] Anaphylaxis  . Penicillins Anaphylaxis    Has patient had a PCN reaction causing immediate rash, facial/tongue/throat swelling, SOB or lightheadedness with hypotension: Yes Has patient had a PCN reaction causing severe rash involving mucus membranes or skin necrosis: No Has patient had a PCN reaction that required hospitalization: Yes Has patient had a PCN reaction occurring within the last 10 years: Yes If all of the above answers are "NO", then may proceed with Cephalosporin use.   . Codeine Itching  . Biaxin [Clarithromycin] Rash    VITALS:  Blood pressure (!) 114/98, pulse 62, temperature 98.3 F (36.8 C), temperature source Axillary, resp. rate 20, height 5\' 8"  (1.727 m), weight 123.1 kg, SpO2 99 %.  PHYSICAL EXAMINATION:  Constitutional: Critically ill-appearing HENT: Normocephalic. . Eyes: Conjunctivae are normal. no scleral icterus.  Neck: Neck supple. No JVD. No tracheal deviation. Tracheostomy CVS: RRR, S1/S2 +, no murmurs, no gallops, no carotid bruit.  Pulmonary: Effort and breath sounds normal, no stridor, rhonchi, wheezes, rales.  Abdominal: Soft. BS +,  no distension, tenderness, rebound or guarding.  Musculoskeletal: 1+ lower extremity edema and no tenderness.  Neuro: Obtunded skin: Skin is warm and dry. No rash noted.    LABORATORY PANEL:   CBC Recent Labs  Lab 08-24-2018 0558  WBC 10.6*  HGB 8.5*  HCT 27.2*  PLT 226    ------------------------------------------------------------------------------------------------------------------  Chemistries  Recent Labs  Lab 08-24-2018 0558  NA 136  K 4.4  CL 102  CO2 23  GLUCOSE 143*  BUN 135*  CREATININE 4.15*  CALCIUM 8.3*  MG 2.1  AST 51*  ALT 43  ALKPHOS 197*  BILITOT 0.7   ------------------------------------------------------------------------------------------------------------------  Cardiac Enzymes No results for input(s): TROPONINI in the last 168 hours. ------------------------------------------------------------------------------------------------------------------  RADIOLOGY:  Dg Chest Port 1 View  Result Date: 08/24/18 CLINICAL DATA:  Acute respiratory failure, COPD, diabetes mellitus, hypertension EXAM: PORTABLE CHEST 1 VIEW COMPARISON:  Portable exam 0514 hours compared to 08/10/2018 FINDINGS: Tracheostomy tube and nasogastric tube unchanged. Enlargement of cardiac silhouette with pulmonary vascular congestion. Bibasilar atelectasis. Persistent accentuation of interstitial markings especially in the LEFT lung. No segmental consolidation, pleural effusion or pneumothorax. IMPRESSION: Enlargement of cardiac silhouette with pulmonary vascular congestion. Bibasilar atelectasis and chronic accentuation of interstitial markings. No interval change. Electronically Signed   By: Ulyses Southward M.D.   On: 08-24-18 08:06   Dg Chest Port 1 View  Result Date: 08/10/2018 CLINICAL DATA:  Acute respiratory failure EXAM: PORTABLE CHEST 1 VIEW COMPARISON:  08/08/2018 FINDINGS: Cardiac shadow is enlarged. Tracheostomy tube and nasogastric catheter are again seen in satisfactory position. Lungs are well aerated with minimal bibasilar atelectatic changes. The vascular congestion has improved. No bony abnormality is seen. IMPRESSION: Improved vascular congestion. Bibasilar atelectasis. Electronically Signed   By: Alcide Clever M.D.   On: 08/10/2018 07:32      ASSESSMENT AND PLAN:   57 year old male with history of chronic kidney disease stage IV, diabetes and COPD status post tracheostomy who was brought to the ER after family found him unresponsive.  1.  Severe sepsis with bilateral pneumonia and acute on chronic hypoxic respiratory failure due to acute encephalopathy from hypoglycemia Patient is off ventilator and now on oxygen via tracheostomy    2.  Severe encephalopathy with anoxic brain injury: Neurology consultation appreciated EEG shows generalized slowing  3.  Chronic kidney disease stage IV: Nephrology consultation appreciated Discontinue any nephrotoxic medications No indication for urgent dialysis as per nephrology at this time.  4.  Diabetes: Glycemic control as per intensivist  Patient with overall very poor prognosis and has severe brain injury. Wife does not want PEG or long-term facility placement as per intensivist.  Family leaning towards possibility of transitioning to comfort care measures after discussing with palliative care  Palliative care consult appreciated  CODE STATUS: full  TOTAL TIME TAKING CARE OF THIS PATIENT: 21 minutes.     POSSIBLE D/C ??, DEPENDING ON CLINICAL CONDITION.   Matthew Mayer M.D on 07/25/2018 at 11:41 AM  Between 7am to 6pm - Pager - [phone] After 6pm go to [email or web address] - password [email or web address]  [facility]  Office  [phone]  CC: Primary care physician; Matthew Mayer, Dale E, MD  Note: This dictation was prepared with Dragon dictation along with smaller phrase technology. Any transcriptional errors that result from this process are unintentional.

## 2018-08-11 NOTE — Progress Notes (Signed)
Chaplain responded to OR for EOL. Chaplain offered support and Pastoral presence. Chaplain practiced active listening and reflective questioning. Wife talked about their 4640 years of marriage with no human children. They have animals that they care for. Chaplain prayed with wife and anointed her hands to hold the hands of her husband. Chaplain asked permission to offer a hug and was granted.    08/03/2018 1300  Clinical Encounter Type  Visited With Patient and family together  Visit Type Initial;Spiritual support  Referral From Family  Spiritual Encounters  Spiritual Needs Prayer;Emotional

## 2018-08-11 NOTE — Progress Notes (Signed)
Daily Progress Note   Patient Name: Matthew Mayer       Date: 22-Aug-2018 DOB: 07-Oct-1960  Age: 57 y.o. MRN#: [id number] Attending Physician: Adrian Saran, MD Primary Care Physician: Marina Goodell, MD Admit Date: 08/14/2018  Reason for Consultation/Follow-up: Establishing goals of care  Subjective: Patient remains critically ill with severe anoxic brain injury. Trach collar in place. He continues to show signs of accessory muscle use with respirations. Patient is obtunded, no response to verbal stimuli, minimum response to sternal rub with movement only. He opens eyes and stares at ceiling then closes.   Wife, Clotilde Dieter is at the bedside along with her cousin. She is tearful and speaks on their 58 year marriage and friendship. Support and comfort given. We spoke in details regarding his current condition and future outlook. Wife expresses that she does not want to proceed with PEG tube placement. She states that is the one thing he has always said he did not want and she wants to honor his wishes. I educated wife and her cousin on trajectory and expectations without proper nourishment. Wife was tearful yet verbalized understanding. She also expressed he would never want to live in a nursing facility and she would not want him at one either but she knows she cannot care for him. I encouraged wife to express patient's expressed goals and wishes for himself in the event of his situation. She reports he has dealt with several health concerns and they have always focused on quality. She is tearful and expresses that his quality of life is pretty much nonexistent in his current state and he would not be happy with it if he was able to let her know. She is tearful and states she must honor his wishes and  although she doesn't want to be selfish she knows she wouldn't want him to continue in his current state.   I discussed in detail his current full code status. At this time wife has requested for patient to be a DNR/DNI. We discussed continuing with aggressive medical interventions versus comfort care. Wife stated she would like to proceed with comfort care measures and allow the patient to pass away with respect and comfort. She is tearful and states she is fearful he will suffer, with emphasis on his breathing and respiratory status/trach. Support and comfort  was given. I educated wife on what comfort measures would look like. She is aware that aggressive interventions such as frequent vitals, labwork, radiology testing, tube feeding, and telemetry/continous screen monitoring would discontinue and the medical staff will focus on keeping patient comfortable and managing symptoms as they occur. She is aware that symptoms will be managed medically and the nursing staff does an awesome job assessing patient frequently and paying close attention to nonverbal actions that show signs of discomfort, such as brow furring, moaning, fidgeting, increased respirations, accessory muscle usage, increased heart rate etc. Wife and cousin verbalized understanding and appreciation. Wife verbalizes she feels more comfortable knowing the medical team will continue to provide the same care with a shift in focus.   I briefly discussed expectations during comfort care and the dying process with wife and cousin. I also introduced residential hospice in the event patient is stable for transport and has not passed away in several days. Wife is aware to anticipate hospital death, but with no definitive way to project life expectancy he could potentially remain in his state and be transferred to hospice home. She and her family verbalized understanding. She expressed that she resides in [place] and if he does not pass away and  requires transport she would like somewhere closer to home. Support and comfort given. She understands we will continue to assess daily and decide at a later date depending on his status.   Offered chaplain services, which wife verbalized she would like and appreciate for spiritual support. She confirms DNR/DNI and to proceed with transitioning patient to full comfort care for end-of-life care.   Length of Stay: 10  Current Medications: Scheduled Meds:  . amLODipine  10 mg Per NG tube Daily  . carvedilol  37.5 mg Per Tube BID WC  . chlorhexidine  15 mL Mouth Rinse BID  . famotidine  20 mg Per Tube Daily  . feeding supplement (PRO-STAT SUGAR FREE 64)  30 mL Per Tube TID  . free water  200 mL Per Tube Q4H  . heparin  5,000 Units Subcutaneous Q8H  . hydrALAZINE  75 mg Per Tube Q8H  . insulin aspart  0-15 Units Subcutaneous Q4H  . insulin aspart  4 Units Subcutaneous Q4H  . insulin glargine  25 Units Subcutaneous BID  . isosorbide dinitrate  30 mg Per Tube BID  . mouth rinse  15 mL Mouth Rinse q12n4p  . multivitamin  15 mL Per Tube Daily  . sodium chloride flush  3 mL Intravenous Q12H    Continuous Infusions: . feeding supplement (VITAL HIGH PROTEIN) 1,000 mL (07/25/2018 0548)    PRN Meds: acetaminophen **OR** acetaminophen, bisacodyl, fentaNYL (SUBLIMAZE) injection, hydrALAZINE, ipratropium-albuterol, [DISCONTINUED] ondansetron **OR** ondansetron (ZOFRAN) IV, sennosides, sodium chloride flush  Physical Exam  Constitutional: Vital signs are normal. He appears ill.  Trach collar, does not follow commands, chronically ill appearing  Neck:  Trach midline with secretions   Cardiovascular: Normal rate, regular rhythm and normal heart sounds. Exam reveals decreased pulses.  Generalized pitting edema upper/lower extremity  Pulmonary/Chest: He has decreased breath sounds.  Tracheostomy with 8L/trach collar, secretions noted  Abdominal: Soft. Normal appearance.  Obese, NG tube with tube  feedings   Neurological: He is unresponsive.  Will not follow commands, open eyes but will not track or respond  Skin: Skin is warm and dry. Bruising noted.  Pitting edema, scattered bruising and redness   Psychiatric: Cognition and memory are impaired. He expresses inappropriate judgment. He is noncommunicative.  Nursing note and vitals reviewed.           Vital Signs: BP (!) 89/66   Pulse 61   Temp 98.3 F (36.8 C) (Axillary)   Resp (!) 22   Ht 5\' 8"  (1.727 m)   Wt 123.1 kg   SpO2 99%   BMI 41.26 kg/m  SpO2: SpO2: 99 % O2 Device: O2 Device: Tracheostomy Collar O2 Flow Rate: O2 Flow Rate (L/min): 8 L/min  Intake/output summary:   Intake/Output Summary (Last 24 hours) at 2018/08/31 1256 Last data filed at 08/31/2018 0600 Gross per 24 hour  Intake 1868.13 ml  Output 100 ml  Net 1768.13 ml   LBM: Last BM Date: 08/10/18 Baseline Weight: Weight: 117 kg Most recent weight: Weight: 123.1 kg       Palliative Assessment/Data: PPS 10%    Flowsheet Rows     Most Recent Value  Intake Tab  Referral Department  Critical care  Unit at Time of Referral  ICU  Palliative Care Primary Diagnosis  Neurology  Palliative Care Type  New Palliative care  Reason for referral  Clarify Goals of Care  Date first seen by Palliative Care  08/05/18  Clinical Assessment  Palliative Performance Scale Score  20%  Psychosocial & Spiritual Assessment  Palliative Care Outcomes  Patient/Family meeting held?  Yes  Who was at the meeting?  wife, aunt  Palliative Care Outcomes  Clarified goals of care, ACP counseling assistance, Provided psychosocial or spiritual support      Patient Active Problem List   Diagnosis Date Noted  . 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 Palliative care by specialist   . Goals of care, counseling/discussion   . Hypoglycemia   . Acute respiratory failure (HCC)   . Altered mental status   . Hypertensive urgency   . Acute encephalopathy   .  Sepsis (HCC) 07/25/2018  . Iron deficiency anemia 07/24/2018  . Hypertriglyceridemia 01/27/2017  . Acute renal failure superimposed on stage 4 chronic kidney disease (HCC) 07/07/2016  . Chronic respiratory failure with hypoxia and hypercapnia (HCC) 07/07/2016  . Acute pancreatitis 06/23/2016  . Stage 4 chronic kidney disease (HCC) 04/06/2015  . Chronic diastolic CHF (congestive heart failure), NYHA class 3 (HCC) 02/14/2015  . Benign essential hypertension 02/07/2015  . Depression 10/27/2014  . Type 2 diabetes mellitus with diabetic chronic kidney disease (HCC) 07/29/2014  . Chronic low back pain 07/23/2014  . Exertional chest pain 07/23/2014  . Insulin resistance 05/13/2014  . Long-term insulin use (HCC) 05/13/2014  . Bilateral edema of lower extremity 04/05/2014  . 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 GERD (gastroesophageal reflux disease) 11/27/2013  . Obesity, unspecified 11/27/2013  . Tracheostomy in place [facility]) 11/27/2013  . Vocal cord paralysis 08/12/2013  . COPD (chronic obstructive pulmonary disease) (HCC) 07/28/2013  . Tracheostomy status (HCC) 07/28/2013  . Nocturnal hypoxemia 07/28/2013  . HCAP (healthcare-associated pneumonia) 07/28/2013    Palliative Care Assessment & Plan   Patient Profile: 57 y.o. male  with past medical history of OSA, COPD, chronic trach, HTN, HLD, GERD, DM, CKD stage IV, depression, anemia, CHF, recent AV fistula placement admitted on 08/04/2018 with altered mental status. Patient was found unresponsive at home by wife. When EMS arrived, blood sugar was 45 and spouse reported seizure-like activity. Posturing noted by EMS. Oxygen was found to be 70's with trach collar off. In ED, CT head negative for acute findings. Chest xray revealed bilateral pulmonary infiltrates concern for pneumonia versus pulmonary edema. Janina Mayo  exchanged with cuff and patient placed on full ventilator support. Continues to be unresponsive. Neurology following. Repeat CT negative for  acute findings. EEG abnormal secondary to background slowing, possible metabolic encephalopathy. Palliative medicine consultation for goals of care.   Recommendations/Plan:  DNR/DNI-as requested by wife  Full Comfort Measures  D/C NG/tube feedings   Comfort Cart for family   Will continue with Fentanyl PRN with frequency of every hour for pain/shortness of breath  Robinul PRN for excessive secretions  Ativan PRN for agitation/anxiety  Zofran PRN for nausea   Liquifilm tears PRN for dry eyes  Continue with foley for EOL care  RN may pronounce  Chaplain for spiritual support  PMT will continue to support and follow.   Goals of Care and Additional Recommendations:  Limitations on Scope of Treatment: Full Comfort Care  Code Status:    Code Status Orders  (From admission, onward)         Start     Ordered   08/10/2018 1247  Do not attempt resuscitation (DNR)  Continuous    Question Answer Comment  In the event of cardiac or respiratory ARREST Do not call a "code blue"   In the event of cardiac or respiratory ARREST Do not perform Intubation, CPR, defibrillation or ACLS   In the event of cardiac or respiratory ARREST Use medication by any route, position, wound care, and other measures to relive pain and suffering. May use oxygen, suction and manual treatment of airway obstruction as needed for comfort.      08/07/2018 1246        Code Status History    Date Active Date Inactive Code Status Order ID Comments User Context   08/02/2018 1918 08/09/2018 1246 Full Code [id number]  Milagros Loll, MD ED    Advance Directive Documentation     Most Recent Value  Type of Advance Directive  Healthcare Power of Attorney, Living will  Pre-existing out of facility DNR order (yellow form or pink MOST form)  -  "MOST" Form in Place?  -       Prognosis:   Hours - Days  Discharge Planning:  Anticipated Hospital Death versus residential hospice.   Care plan was discussed  with patient's wife, CSW, and bedside RN.   Thank you for allowing the Palliative Medicine Team to assist in the care of this patient.   Time In: 1200 Time Out: 1330 Total Time 90 min.  Prolonged Time Billed YES        Greater than 50%  of this time was spent counseling and coordinating care related to the above assessment and plan.  Willette Alma, AGPCNP-BC Palliative Medicine Team  Phone: [phone] Fax: [phone] Pager: [phone] Amion: Thea Alken   Please contact Palliative Medicine Team phone at [phone] for questions and concerns.

## 2018-08-11 NOTE — Progress Notes (Signed)
Pt now comfort care. DNR armband placed on pt, NG tube discontinued with no complications, and new orders placed. Will carry out orders and continue to monitor.

## 2018-08-11 NOTE — Progress Notes (Signed)
Noted family does not want a peg tube   I will sign off.  Please call me if any further GI concerns or questions.  We would like to thank you for the opportunity to participate in the care of Matthew Mayer.

## 2018-08-12 MED ORDER — GLYCOPYRROLATE 0.2 MG/ML IJ SOLN
0.3000 mg | INTRAMUSCULAR | Status: DC
  Administered 2018-08-12 – 2018-08-13 (×7): 0.3 mg via INTRAVENOUS
  Filled 2018-08-12 (×12): qty 1.5

## 2018-08-12 MED ORDER — SCOPOLAMINE 1 MG/3DAYS TD PT72
1.0000 | MEDICATED_PATCH | TRANSDERMAL | Status: DC
  Administered 2018-08-12: 13:00:00 1.5 mg via TRANSDERMAL
  Filled 2018-08-12: qty 1

## 2018-08-12 NOTE — Progress Notes (Signed)
Daily Progress Note   Patient Name: Matthew OharaDonald W Mayer       Date: 08/12/2018 DOB: 09-11-1961  Age: 57 y.o. MRN#: [id number] Attending Physician: Ramonita LabGouru, Aruna, MD Primary Care Physician: Marina GoodellFeldpausch, Dale E, MD Admit Date: 08/02/2018  Reason for Consultation/Follow-up: Establishing goals of care, Non pain symptom management, Pain control and Psychosocial/spiritual support  Subjective: Patient remains unresponsive. Trach collar intact with secretions noted and audible secretions. Usage of accessory muscles with respirations. Does not appear in pain. Wife at bedside and expresses concerns of respiratory distress and continuous secretions needing suctioning. Bedside RN suctioned earlier. Staff notified for trach care and support due to secretions. Support and comfort given to wife. Discussed signs of air hunger and if she felt he is having increased work of breathing to notify RN. Wife verbalized understanding and appreciation.   Bedside RN aware of patient's needs.   Chart reviewed and report received from bedside, RN Wanette.  Length of Stay: 11  Current Medications: Scheduled Meds:  . chlorhexidine  15 mL Mouth Rinse BID  . sodium chloride flush  3 mL Intravenous Q12H    Continuous Infusions:  PRN Meds: antiseptic oral rinse, bisacodyl, fentaNYL (SUBLIMAZE) injection, glycopyrrolate, ipratropium-albuterol, LORazepam **OR** LORazepam, [DISCONTINUED] ondansetron **OR** ondansetron (ZOFRAN) IV, polyvinyl alcohol, sodium chloride flush  Physical Exam  Constitutional: He appears ill.  Actively dying   Cardiovascular: Normal rate, regular rhythm and normal heart sounds. Exam reveals decreased pulses.  Pulmonary/Chest: Accessory muscle usage present. He has decreased breath sounds. He has  wheezes.  Trach collar 6L/[place], secretions noted   Abdominal: Soft. Normal appearance. Bowel sounds are decreased.  obese  Neurological: He is unresponsive.  Skin: Skin is warm. Bruising noted.  Nursing note reviewed.           Vital Signs: BP (!) 110/45 (BP Location: Right Leg)   Pulse (!) 57   Temp 97.6 F (36.4 C) (Oral)   Resp 18   Ht 5\' 8"  (1.727 m)   Wt 118.6 kg   SpO2 94%   BMI 39.75 kg/m  SpO2: SpO2: 94 % O2 Device: O2 Device: Tracheostomy Collar O2 Flow Rate: O2 Flow Rate (L/min): 6 L/min  Intake/output summary:   Intake/Output Summary (Last 24 hours) at 08/12/2018 1141 Last data filed at 08/12/2018 0508 Gross per 24 hour  Intake -  Output 900 ml  Net -900 ml   LBM: Last BM Date: 08/30/18 Baseline Weight: Weight: 117 kg Most recent weight: Weight: 118.6 kg       Palliative Assessment/Data: PPS 10%     Flowsheet Rows     Most Recent Value  Intake Tab  Referral Department  Critical care  Unit at Time of Referral  ICU  Palliative Care Primary Diagnosis  Neurology  Palliative Care Type  New Palliative care  Reason for referral  Clarify Goals of Care  Date first seen by Palliative Care  08/05/18  Clinical Assessment  Palliative Performance Scale Score  20%  Psychosocial & Spiritual Assessment  Palliative Care Outcomes  Patient/Family meeting held?  Yes  Who was at the meeting?  wife, aunt  Palliative Care Outcomes  Clarified goals of care, ACP counseling assistance, Provided psychosocial or spiritual support      Patient Active Problem List   Diagnosis Date Noted  . 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 Palliative care by specialist   . Goals of care, counseling/discussion   . Hypoglycemia   . Acute respiratory failure (HCC)   . Altered mental status   . Hypertensive urgency   . Acute encephalopathy   . Sepsis (HCC) 08/12/2018  . Iron deficiency anemia 07/24/2018  . Hypertriglyceridemia 01/27/2017  . Acute renal failure  superimposed on stage 4 chronic kidney disease (HCC) 07/07/2016  . Chronic respiratory failure with hypoxia and hypercapnia (HCC) 07/07/2016  . Acute pancreatitis 06/23/2016  . Stage 4 chronic kidney disease (HCC) 04/06/2015  . Chronic diastolic CHF (congestive heart failure), NYHA class 3 (HCC) 02/14/2015  . Benign essential hypertension 02/07/2015  . Depression 10/27/2014  . Type 2 diabetes mellitus with diabetic chronic kidney disease (HCC) 07/29/2014  . Chronic low back pain 07/23/2014  . Exertional chest pain 07/23/2014  . Insulin resistance 05/13/2014  . Long-term insulin use (HCC) 05/13/2014  . Bilateral edema of lower extremity 04/05/2014  . 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 GERD (gastroesophageal reflux disease) 11/27/2013  . Obesity, unspecified 11/27/2013  . Tracheostomy in place [facility]) 11/27/2013  . Vocal cord paralysis 08/12/2013  . COPD (chronic obstructive pulmonary disease) (HCC) 07/28/2013  . Tracheostomy status (HCC) 07/28/2013  . Nocturnal hypoxemia 07/28/2013  . HCAP (healthcare-associated pneumonia) 07/28/2013    Palliative Care Assessment & Plan   Patient Profile: 57 y.o. male  with past medical history of OSA, COPD, chronic trach, HTN, HLD, GERD, DM, CKD stage IV, depression, anemia, CHF, recent AV fistula placement admitted on 08/05/2018 with altered mental status. Patient was found unresponsive at home by wife. When EMS arrived, blood sugar was 45 and spouse reported seizure-like activity. Posturing noted by EMS. Oxygen was found to be 70's with trach collar off. In ED, CT head negative for acute findings. Chest xray revealed bilateral pulmonary infiltrates concern for pneumonia versus pulmonary edema. Trach exchanged with cuff and patient placed on full ventilator support. Continues to be unresponsive. Neurology following. Repeat CT negative for acute findings. EEG abnormal secondary to background slowing, possible metabolic encephalopathy. Palliative medicine  consultation for goals of care.   Recommendations/Plan:  Continue with full comfort measures  Robinul scheduled every 4hrs for excessive secretions  Scopolamine patch for secretions  Patient unable to transfer to residential hospice at this time. Will continue to monitor daily for stability.   PMT will continue to support and follow.   Goals of Care and Additional Recommendations:  Limitations on Scope of Treatment: Full Comfort Care  Code Status:    Code Status Orders  (From admission, onward)         Start     Ordered   08/19/2018 1316  Do not attempt resuscitation (DNR)  Continuous    Question Answer Comment  In the event of cardiac or respiratory ARREST Do not call a "code blue"   In the event of cardiac or respiratory ARREST Do not perform Intubation, CPR, defibrillation or ACLS   In the event of cardiac or respiratory ARREST Use medication by any route, position, wound care, and other measures to relive pain and suffering. May use oxygen, suction and manual treatment of airway obstruction as needed for comfort.      08/12/2018 1318        Code Status History    Date Active Date Inactive Code Status Order ID Comments User Context   08/07/2018 1246 08/14/2018 1318 DNR [id number]  Glee Arvin, NP Inpatient   08/12/2018 1918 08/23/2018 1246 Full Code [id number]  Milagros Loll, MD ED    Advance Directive Documentation     Most Recent Value  Type of Advance Directive  Healthcare Power of Attorney, Living will  Pre-existing out of facility DNR order (yellow form or pink MOST form)  -  "MOST" Form in Place?  -      Prognosis:   < 2 weeks  Discharge Planning:  Anticipated Hospital Death versus residential hospice   Care plan was discussed with patient's wife, CSW, and bedside RN.   Thank you for allowing the Palliative Medicine Team to assist in the care of this patient.   Total Time 35 min.  Prolonged Time Billed  NO        Greater than 50%   of this time was spent counseling and coordinating care related to the above assessment and plan.  Willette Alma, AGPCNP-BC Palliative Medicine Team  Pager: [phone] Amion: Thea Alken   Please contact Palliative Medicine Team phone at [phone] for questions and concerns.

## 2018-08-12 NOTE — Progress Notes (Signed)
[employer] - [place] at [facility]   PATIENT NAME: Matthew Mayer     MR#:  [id number]  DATE OF BIRTH:  [birth year]  SUBJECTIVE:  Patient is under comfort care  wife  at bedside.  Encephalopathy persists.   REVIEW OF SYSTEMS:  Unable to obtain         DRUG ALLERGIES:   Allergies  Allergen Reactions  . Augmentin [Amoxicillin-Pot Clavulanate] Anaphylaxis  . Penicillins Anaphylaxis    Has patient had a PCN reaction causing immediate rash, facial/tongue/throat swelling, SOB or lightheadedness with hypotension: Yes Has patient had a PCN reaction causing severe rash involving mucus membranes or skin necrosis: No Has patient had a PCN reaction that required hospitalization: Yes Has patient had a PCN reaction occurring within the last 10 years: Yes If all of the above answers are "NO", then may proceed with Cephalosporin use.   . Codeine Itching  . Biaxin [Clarithromycin] Rash    VITALS:  Blood pressure (!) 110/45, pulse (!) 57, temperature 97.6 F (36.4 C), temperature source Oral, resp. rate 18, height 5\' 8"  (1.727 m), weight 118.6 kg, SpO2 94 %.  PHYSICAL EXAMINATION:  Constitutional: Critically ill-appearing HENT: Normocephalic. . Eyes: Conjunctivae are normal. no scleral icterus.  Neck: Neck supple. No JVD. No tracheal deviation. Tracheostomy with trach collar CVS: RRR, S1/S2 +, no murmurs, no gallops, no carotid bruit.  Pulmonary: Effort and breath sounds normal, no stridor, rhonchi, wheezes, rales.  Abdominal: Soft. BS +,  no distension, tenderness, rebound or guarding.  Musculoskeletal: 1+ lower extremity edema and no tenderness.  Neuro: Obtunded skin: Skin is warm and dry. No rash noted.    LABORATORY PANEL:   CBC Recent Labs  Lab 07/30/2018 0558  WBC 10.6*  HGB 8.5*  HCT 27.2*  PLT 226   ------------------------------------------------------------------------------------------------------------------  Chemistries  Recent Labs  Lab  08/15/2018 0558  NA 136  K 4.4  CL 102  CO2 23  GLUCOSE 143*  BUN 135*  CREATININE 4.15*  CALCIUM 8.3*  MG 2.1  AST 51*  ALT 43  ALKPHOS 197*  BILITOT 0.7   ------------------------------------------------------------------------------------------------------------------  Cardiac Enzymes No results for input(s): TROPONINI in the last 168 hours. ------------------------------------------------------------------------------------------------------------------  RADIOLOGY:  Dg Chest Port 1 View  Result Date: 08/10/2018 CLINICAL DATA:  Acute respiratory failure, COPD, diabetes mellitus, hypertension EXAM: PORTABLE CHEST 1 VIEW COMPARISON:  Portable exam 0514 hours compared to 08/10/2018 FINDINGS: Tracheostomy tube and nasogastric tube unchanged. Enlargement of cardiac silhouette with pulmonary vascular congestion. Bibasilar atelectasis. Persistent accentuation of interstitial markings especially in the LEFT lung. No segmental consolidation, pleural effusion or pneumothorax. IMPRESSION: Enlargement of cardiac silhouette with pulmonary vascular congestion. Bibasilar atelectasis and chronic accentuation of interstitial markings. No interval change. Electronically Signed   By: Ulyses SouthwardMark  Boles M.D.   On: 07/31/2018 08:06     ASSESSMENT AND PLAN:   57 year old male with history of chronic kidney disease stage IV, diabetes and COPD status post tracheostomy who was brought to the ER after family found him unresponsive.  1.  Severe sepsis with bilateral pneumonia and acute on chronic hypoxic respiratory failure due to acute encephalopathy from hypoglycemia Patient is off ventilator and now on comfort care measures  2.  Severe encephalopathy with anoxic brain injury: Neurology consultation appreciated  3.  Chronic kidney disease stage IV: .  4.  Diabetes:   5.  Failure to thrive with severe anoxic brain injury  on strict comfort care measures Palliative care is following.  Patient is  unstable to  be transferred to inpatient hospice      Palliative care consult appreciated  CODE STATUS: full  TOTAL TIME TAKING CARE OF THIS PATIENT: 25 minutes.     POSSIBLE D/C ??, DEPENDING ON CLINICAL CONDITION.   Ramonita Lab M.D on 08/12/2018 at 3:51 PM  Between 7am to 6pm - Pager - [phone] After 6pm go to [email or web address] - password [email or web address]  [facility]  Office  [phone]  CC: Primary care physician; Marina Goodell, MD  Note: This dictation was prepared with Dragon dictation along with smaller phrase technology. Any transcriptional errors that result from this process are unintentional.

## 2018-08-12 NOTE — Progress Notes (Signed)
Nutrition Brief Follow-Up Note  Chart reviewed. Patient now transitioning to comfort care.   No further nutrition interventions warranted at this time.  Please re-consult RD as needed.   Tramell Piechota, MS, RD, LDN Office: [phone] Pager: [phone] After Hours/Weekend Pager: [phone]    

## 2018-08-13 MED ORDER — MORPHINE SULFATE (PF) 2 MG/ML IV SOLN
2.0000 mg | INTRAVENOUS | Status: DC | PRN
  Filled 2018-08-13: qty 1

## 2018-08-13 MED ORDER — MORPHINE SULFATE (PF) 2 MG/ML IV SOLN
2.0000 mg | INTRAVENOUS | Status: DC | PRN
  Administered 2018-08-13 (×3): 2 mg via INTRAVENOUS
  Filled 2018-08-13 (×2): qty 1

## 2018-08-13 MED ORDER — DIPHENHYDRAMINE HCL 50 MG/ML IJ SOLN
25.0000 mg | Freq: Four times a day (QID) | INTRAMUSCULAR | Status: DC | PRN
  Filled 2018-08-13: qty 0.5

## 2018-08-14 NOTE — Progress Notes (Signed)
Patient expired at 2150, noted no respirations, no heartbeat, and no pulse. Death verified by Leroy SeaKimberly Debty, RN. Family at bedside, MD notified, [facility] aware and [facility] notified.

## 2018-08-19 NOTE — Discharge Summary (Signed)
Date of admission 08/21/2018  Date of death 08/23/2018   57 year old male with history of chronic kidney disease stage IV, diabetes and COPD status post tracheostomy who was brought to the ER after family found him unresponsive.  1.  Severe sepsis with bilateral pneumonia and acute on chronic hypoxic respiratory failure due to acute encephalopathy from hypoglycemia Patient is off ventilator and comfort care measures morphine as needed for respiratory distress and anxiety after evaluated by palliative care  2.  Severe encephalopathy with anoxic brain injury: Neurology consultation appreciated  3.  Chronic kidney disease stage IV: .  4.  Diabetes:   5.  Failure to thrive with severe anoxic brain injury  on strict comfort care measures Palliative care is following.  Patient is unstable to be transferred to inpatient hospice anticipating hospital death Discussed with family.  Wife was agreeable  Patient was deceased under comfort care measures on 08/22/2018  at  21 hours 50 minutes time according to the nurses note

## 2018-08-24 NOTE — Progress Notes (Signed)
   08/19/18 2147  Clinical Encounter Type  Visited With Family  Visit Type Follow-up;Spiritual support;Death  Referral From Nurse  Consult/Referral To Chaplain  Spiritual Encounters  Spiritual Needs Prayer;Emotional;Grief support   CH received a page from unit clerk that patient had died. I reported to the patient's room and met with Mr. Sarr wife and other family members. The family was griefing but also very appreciative of my visit and also spoke well of [place] and [place]. I prayed with the family and offered my support if needed. I left the family to allow them some privacy.

## 2018-08-24 NOTE — Progress Notes (Signed)
[employer] - [place] at [facility]   PATIENT NAME: Matthew Mayer     MR#:  [id number]  DATE OF BIRTH:  02/15/1961  SUBJECTIVE:  Patient is under comfort care  wife  at bedside.  Encephalopathy persists.  Working hard to breath today  REVIEW OF SYSTEMS:  Unable to obtain         DRUG ALLERGIES:   Allergies  Allergen Reactions  . Augmentin [Amoxicillin-Pot Clavulanate] Anaphylaxis  . Penicillins Anaphylaxis    Has patient had a PCN reaction causing immediate rash, facial/tongue/throat swelling, SOB or lightheadedness with hypotension: Yes Has patient had a PCN reaction causing severe rash involving mucus membranes or skin necrosis: No Has patient had a PCN reaction that required hospitalization: Yes Has patient had a PCN reaction occurring within the last 10 years: Yes If all of the above answers are "NO", then may proceed with Cephalosporin use.   . Codeine Itching  . Biaxin [Clarithromycin] Rash    VITALS:  Blood pressure (!) 122/43, pulse 72, temperature (!) 97.4 F (36.3 C), temperature source Oral, resp. rate 19, height 5\' 8"  (1.727 m), weight 118.6 kg, SpO2 93 %.  PHYSICAL EXAMINATION:  Constitutional: Critically ill-appearing HENT: Normocephalic. . Eyes: Conjunctivae are normal. no scleral icterus.  Neck: Neck supple. No JVD. No tracheal deviation. Tracheostomy with trach collar CVS: RRR, S1/S2 +, no murmurs, no gallops, no carotid bruit.  Pulmonary: Accessory muscle use is present, no stridor, has rhonchi , rales.  Abdominal: Soft. BS +,  no distension, tenderness, rebound or guarding.  Musculoskeletal: 1+ lower extremity edema and no tenderness.  Neuro: Obtunded skin: Skin is warm and dry. No rash noted.    LABORATORY PANEL:   CBC Recent Labs  Lab 2018-02-13 0558  WBC 10.6*  HGB 8.5*  HCT 27.2*  PLT 226    ------------------------------------------------------------------------------------------------------------------  Chemistries  Recent Labs  Lab 2018-02-13 0558  NA 136  K 4.4  CL 102  CO2 23  GLUCOSE 143*  BUN 135*  CREATININE 4.15*  CALCIUM 8.3*  MG 2.1  AST 51*  ALT 43  ALKPHOS 197*  BILITOT 0.7   ------------------------------------------------------------------------------------------------------------------  Cardiac Enzymes No results for input(s): TROPONINI in the last 168 hours. ------------------------------------------------------------------------------------------------------------------  RADIOLOGY:  No results found.   ASSESSMENT AND PLAN:   57 year old male with history of chronic kidney disease stage IV, diabetes and COPD status post tracheostomy who was brought to the ER after family found him unresponsive.  1.  Severe sepsis with bilateral pneumonia and acute on chronic hypoxic respiratory failure due to acute encephalopathy from hypoglycemia Patient is off ventilator and now on comfort care measures morphine as needed for respiratory distress and anxiety  2.  Severe encephalopathy with anoxic brain injury: Neurology consultation appreciated  3.  Chronic kidney disease stage IV: .  4.  Diabetes:   5.  Failure to thrive with severe anoxic brain injury  on strict comfort care measures Palliative care is following.  Patient is unstable to be transferred to inpatient hospice anticipating hospital death Discussed with family.  Wife at bedside.  Aware of the plan      Palliative care consult appreciated  CODE STATUS: full  TOTAL TIME TAKING CARE OF THIS PATIENT: 25 minutes.     POSSIBLE D/C ??, DEPENDING ON CLINICAL CONDITION.   Ramonita LabAruna Kamala Kolton M.D on 08/08/2018 at 1:23 PM  Between 7am to 6pm - Pager - [phone] After 6pm go to [email or web address] - password [email or web address]  [employer]  Office  [phone]  CC: Primary care  physician; Marina Goodell, MD  Note: This dictation was prepared with Dragon dictation along with smaller phrase technology. Any transcriptional errors that result from this process are unintentional.

## 2018-08-24 NOTE — Progress Notes (Signed)
Patient's wife would like this information is be used when patient expires.    Matthew Mayer [id number]  [facility] [place] [phone]  Patient's pastor works for [employer] and they will be transporting patient to the funeral home and will need [facility].

## 2018-08-24 NOTE — Progress Notes (Signed)
Daily Progress Note   Patient Name: Matthew Mayer       Date: 09/07/18 DOB: May 09, 1961  Age: 57 y.o. MRN#: [id number] Attending Physician: Ramonita Lab, MD Primary Care Physician: Marina Goodell, MD Admit Date: 08/02/2018  Reason for Consultation/Follow-up: Establishing goals of care, Non pain symptom management, Pain control and Psychosocial/spiritual support  Subjective: Patient actively dying. Unresponsive. Trach collar in place. Patient showing signs of air hunger with use of accessory muscles. Some coughing noted. Secretions sound much more controlled and less congested compared to yesterday. Some mottling noted to bilateral lower extremity. Wife and her aunt is at the bedside. Dr. Amado Coe also present.   We discussed with wife patient's instability to transfer to hospice home due to increased respiratory distress. Also discussed changes in breathing pattern and patient is showing increasing signs of near death. Wife tearful but verbalized understanding. She expresses she has felt all day that he was going to pass away soon. Support and comfort provided.   I discussed with wife the need to initiate patient on morphine as needed for pain and air hunger. She is aware that benadryl will also be available if patient has any reactions such as itching. Educated patient most likely will tolerate given his unresponsive state. I also educated wife if patient continues to show signs of respiratory distress it may be of more support to initiate patient on a morphine drip versus PRN dosing. Wife verbalized understanding. She would like to attempt to manage his shortness of breath with as needed dosing for now with awareness we may need to initiate drip if patient continues to show signs of air hunger.  She verbalized understanding. Wife very appreciative of all the care and support she and her husband is receiving.   Chart Reviewed.    Length of Stay: 12  Current Medications: Scheduled Meds:  . chlorhexidine  15 mL Mouth Rinse BID  . glycopyrrolate  0.3 mg Intravenous Q4H  . scopolamine  1 patch Transdermal Q72H  . sodium chloride flush  3 mL Intravenous Q12H    Continuous Infusions:  PRN Meds: antiseptic oral rinse, bisacodyl, diphenhydrAMINE, ipratropium-albuterol, LORazepam **OR** LORazepam, morphine injection, [DISCONTINUED] ondansetron **OR** ondansetron (ZOFRAN) IV, polyvinyl alcohol, sodium chloride flush  Physical Exam  Constitutional: He appears ill.  Actively dying   Cardiovascular: Normal rate, regular  rhythm and normal heart sounds. Exam reveals decreased pulses.  Pulmonary/Chest: Accessory muscle usage present. He has decreased breath sounds. He has wheezes.  Trach collar   Abdominal: Soft. Normal appearance. Bowel sounds are decreased.  obese  Neurological: He is unresponsive.  Skin: Skin is warm. Bruising noted.  Psychiatric: Cognition and memory are impaired. He expresses inappropriate judgment.  Nursing note reviewed.           Vital Signs: BP (!) 122/43 (BP Location: Right Leg)   Pulse 72   Temp (!) 97.4 F (36.3 C) (Oral)   Resp 19   Ht 5\' 8"  (1.727 m)   Wt 118.6 kg   SpO2 93%   BMI 39.75 kg/m  SpO2: SpO2: 93 % O2 Device: O2 Device: Tracheostomy Collar O2 Flow Rate: O2 Flow Rate (L/min): 9 L/min  Intake/output summary:   Intake/Output Summary (Last 24 hours) at 08/09/2018 1331 Last data filed at 08/06/2018 0631 Gross per 24 hour  Intake -  Output 2000 ml  Net -2000 ml   LBM: Last BM Date: 08/12/18 Baseline Weight: Weight: 117 kg Most recent weight: Weight: 118.6 kg      Palliative Assessment/Data: PPS 10%     Flowsheet Rows     Most Recent Value  Intake Tab  Referral Department  Critical care  Unit at Time of Referral  ICU    Palliative Care Primary Diagnosis  Neurology  Date Notified  08/04/18  Palliative Care Type  New Palliative care  Reason for referral  Clarify Goals of Care  Date of Admission  08/09/2018  Date first seen by Palliative Care  08/05/18  # of days Palliative referral response time  1 Day(s)  # of days IP prior to Palliative referral  3  Clinical Assessment  Palliative Performance Scale Score  20%  Psychosocial & Spiritual Assessment  Palliative Care Outcomes  Patient/Family meeting held?  Yes  Who was at the meeting?  wife, aunt  Palliative Care Outcomes  Clarified goals of care, ACP counseling assistance, Provided psychosocial or spiritual support      Patient Active Problem List   Diagnosis Date Noted  . 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 Palliative care by specialist   . Goals of care, counseling/discussion   . Hypoglycemia   . Acute respiratory failure (HCC)   . Altered mental status   . Hypertensive urgency   . Acute encephalopathy   . Sepsis (HCC) 08/14/2018  . Iron deficiency anemia 07/24/2018  . Hypertriglyceridemia 01/27/2017  . Acute renal failure superimposed on stage 4 chronic kidney disease (HCC) 07/07/2016  . Chronic respiratory failure with hypoxia and hypercapnia (HCC) 07/07/2016  . Acute pancreatitis 06/23/2016  . Stage 4 chronic kidney disease (HCC) 04/06/2015  . Chronic diastolic CHF (congestive heart failure), NYHA class 3 (HCC) 02/14/2015  . Benign essential hypertension 02/07/2015  . Depression 10/27/2014  . Type 2 diabetes mellitus with diabetic chronic kidney disease (HCC) 07/29/2014  . Chronic low back pain 07/23/2014  . Exertional chest pain 07/23/2014  . Insulin resistance 05/13/2014  . Long-term insulin use (HCC) 05/13/2014  . Bilateral edema of lower extremity 04/05/2014  . 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 GERD (gastroesophageal reflux disease) 11/27/2013  . Obesity, unspecified 11/27/2013  . Tracheostomy in place [facility])  11/27/2013  . Vocal cord paralysis 08/12/2013  . COPD (chronic obstructive pulmonary disease) (HCC) 07/28/2013  . Tracheostomy status (HCC) 07/28/2013  . Nocturnal hypoxemia 07/28/2013  . HCAP (healthcare-associated pneumonia) 07/28/2013  Palliative Care Assessment & Plan   Patient Profile: 57 y.o. male  with past medical history of OSA, COPD, chronic trach, HTN, HLD, GERD, DM, CKD stage IV, depression, anemia, CHF, recent AV fistula placement admitted on 08/20/2018 with altered mental status. Patient was found unresponsive at home by wife. When EMS arrived, blood sugar was 45 and spouse reported seizure-like activity. Posturing noted by EMS. Oxygen was found to be 70's with trach collar off. In ED, CT head negative for acute findings. Chest xray revealed bilateral pulmonary infiltrates concern for pneumonia versus pulmonary edema. Trach exchanged with cuff and patient placed on full ventilator support. Continues to be unresponsive. Neurology following. Repeat CT negative for acute findings. EEG abnormal secondary to background slowing, possible metabolic encephalopathy. Palliative medicine consultation for goals of care.   Recommendations/Plan:  Continue with comfort measures  Morphine every hour prn for pain/air hunger. Wife aware we may have to initiate a morphine drip if patient continues to show signs of air hunger or requires frequent prn dosing for symptoms.   Patient unable to transfer to residential hospice at this time due to respiratory distress. Will continue to monitor daily for stability.   PMT will continue to support and follow.   Goals of Care and Additional Recommendations:  Limitations on Scope of Treatment: Full Comfort Care  Code Status:    Code Status Orders  (From admission, onward)         Start     Ordered   07/30/2018 1316  Do not attempt resuscitation (DNR)  Continuous    Question Answer Comment  In the event of cardiac or respiratory ARREST Do not  call a "code blue"   In the event of cardiac or respiratory ARREST Do not perform Intubation, CPR, defibrillation or ACLS   In the event of cardiac or respiratory ARREST Use medication by any route, position, wound care, and other measures to relive pain and suffering. May use oxygen, suction and manual treatment of airway obstruction as needed for comfort.      08/06/2018 1318        Code Status History    Date Active Date Inactive Code Status Order ID Comments User Context   08/16/2018 1246 08/12/2018 1318 DNR [id number]  Glee ArvinPickenpack-Cousar, Eldon Zietlow N, NP Inpatient   07/29/2018 1918 08/08/2018 1246 Full Code [id number]  Milagros LollSudini, Srikar, MD ED    Advance Directive Documentation     Most Recent Value  Type of Advance Directive  Healthcare Power of Attorney, Living will  Pre-existing out of facility DNR order (yellow form or pink MOST form)  -  "MOST" Form in Place?  -      Prognosis:   < 2 weeks  Discharge Planning:  Anticipated Hospital Death versus residential hospice   Care plan was discussed with patient's wife, CSW, and bedside RN.   Thank you for allowing the Palliative Medicine Team to assist in the care of this patient.   Total Time 35 min.  Prolonged Time Billed  NO       Greater than 50%  of this time was spent counseling and coordinating care related to the above assessment and plan.  Willette AlmaNikki Pickenpack-Cousar, AGPCNP-BC Palliative Medicine Team  Pager: [phone] Amion: Thea AlkenN. Cousar   Please contact Palliative Medicine Team phone at [phone] for questions and concerns.

## 2018-08-24 DEATH — deceased

## 2018-09-01 ENCOUNTER — Ambulatory Visit (INDEPENDENT_AMBULATORY_CARE_PROVIDER_SITE_OTHER): Payer: BLUE CROSS/BLUE SHIELD | Admitting: Vascular Surgery

## 2018-09-01 ENCOUNTER — Encounter (INDEPENDENT_AMBULATORY_CARE_PROVIDER_SITE_OTHER): Payer: BLUE CROSS/BLUE SHIELD

## 2018-09-29 ENCOUNTER — Ambulatory Visit: Payer: BLUE CROSS/BLUE SHIELD

## 2018-09-29 ENCOUNTER — Ambulatory Visit: Payer: BLUE CROSS/BLUE SHIELD | Admitting: Oncology

## 2018-09-29 ENCOUNTER — Other Ambulatory Visit: Payer: BLUE CROSS/BLUE SHIELD

## 2018-11-26 IMAGING — CT CT HEAD W/O CM
3 series · 15 of 47 positions shown, 18 images · non-contrast
Comparison: CT head 08/01/2018

CLINICAL DATA: Altered level of consciousness.

EXAM:
CT HEAD WITHOUT CONTRAST
TECHNIQUE: Contiguous axial images were obtained from the base of the skull
through the vertex without intravenous contrast.

[Series 2: head wo · axial · 0.47mm/px · z∈[+539,+664]mm · 9 of 30 slices shown, 12 images]
[im 3/30  brain]
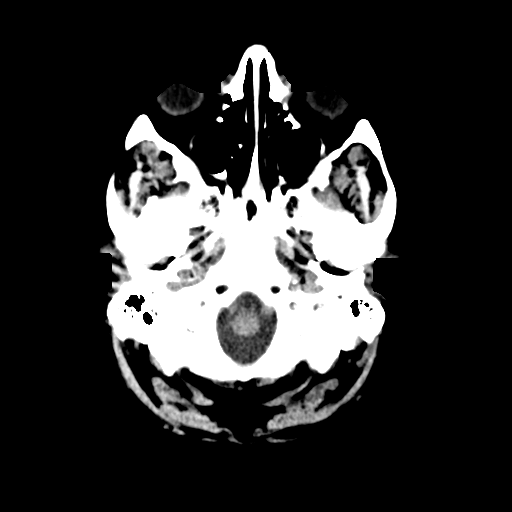
[im 3/30  bone]
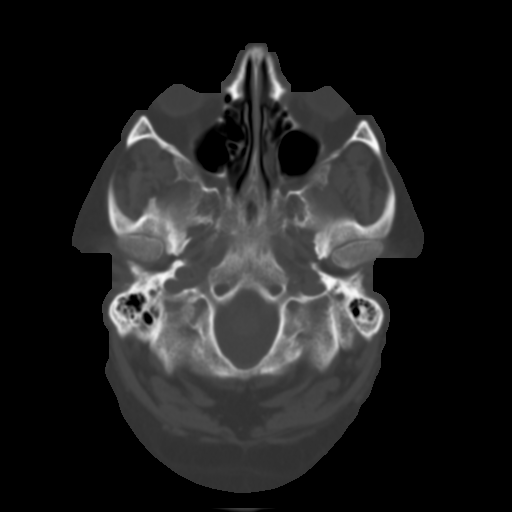
[im 6/30  brain]
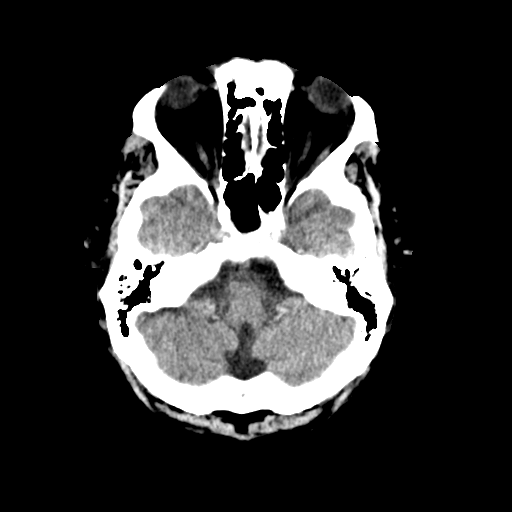
[im 9/30  brain]
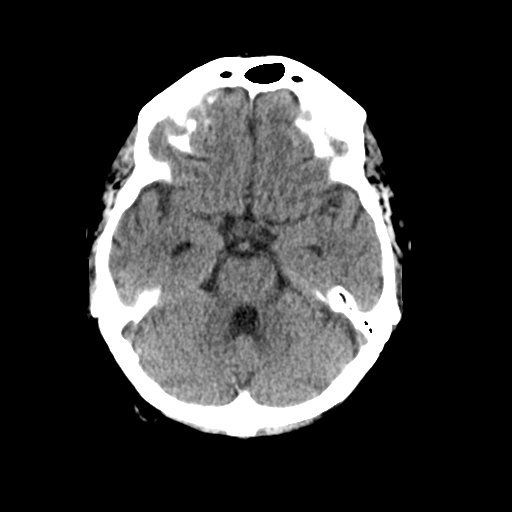
[im 12/30  brain]
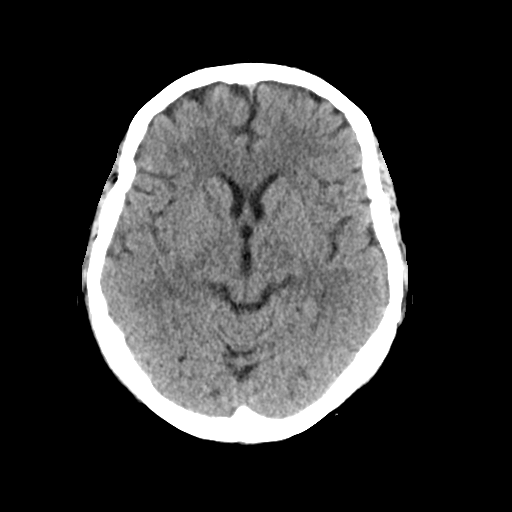
[im 16/30  brain]
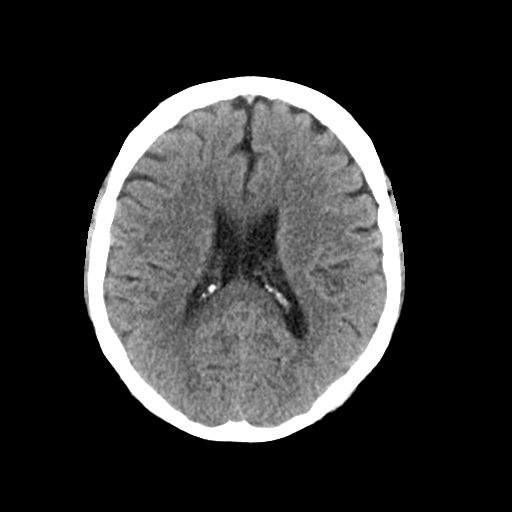
[im 16/30  bone]
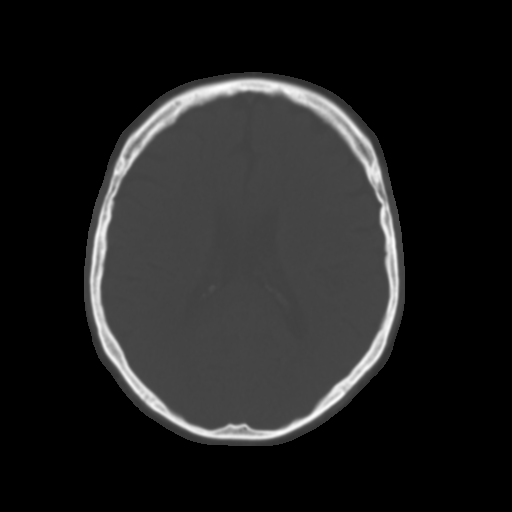
[im 19/30  brain]
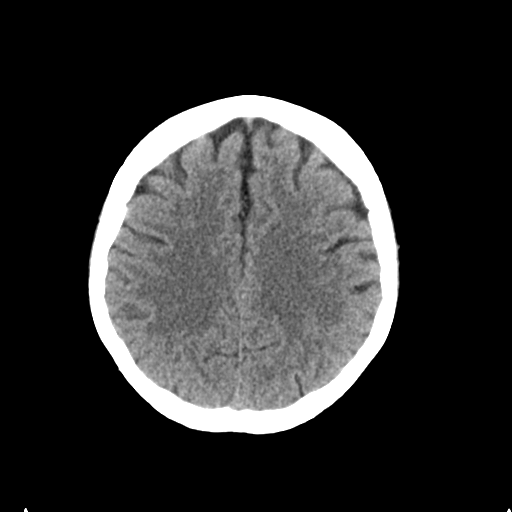
[im 22/30  brain]
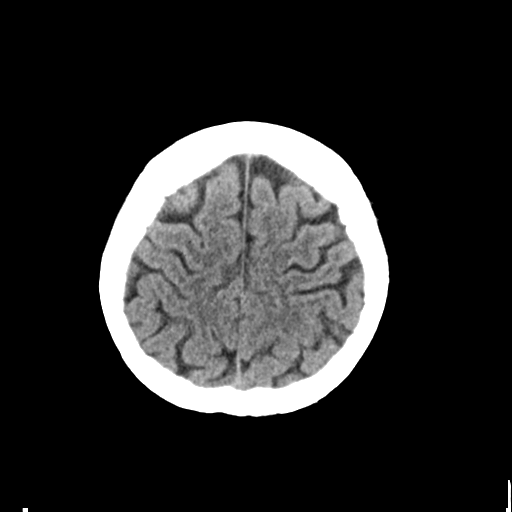
[im 25/30  brain]
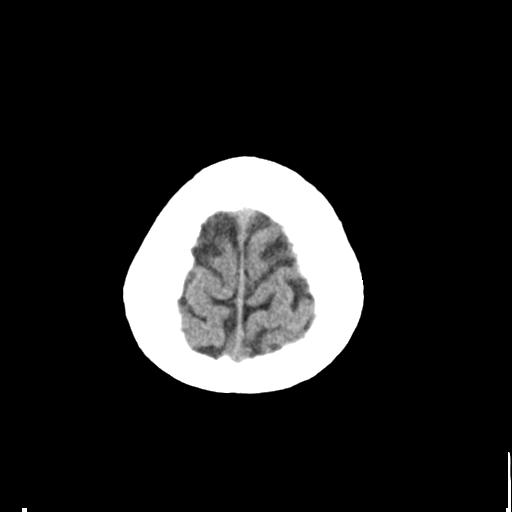
[im 28/30  brain]
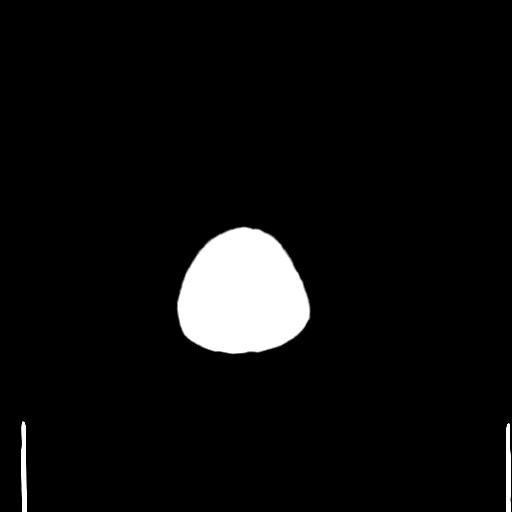
[im 28/30  bone]
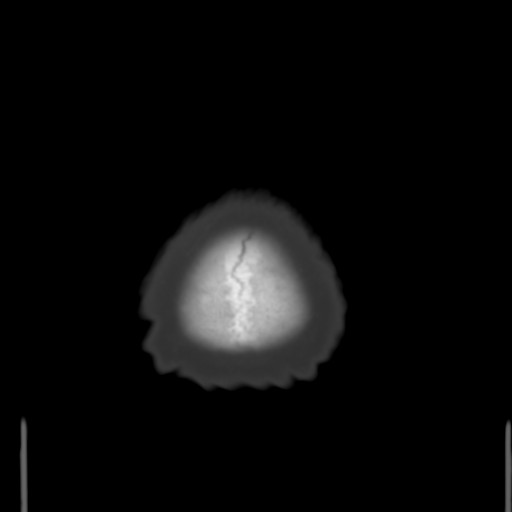

[Series 4: coronal soft tissue · coronal · 0.29mm/px · 3 of 61 slices shown]
[im 21/61  brain]
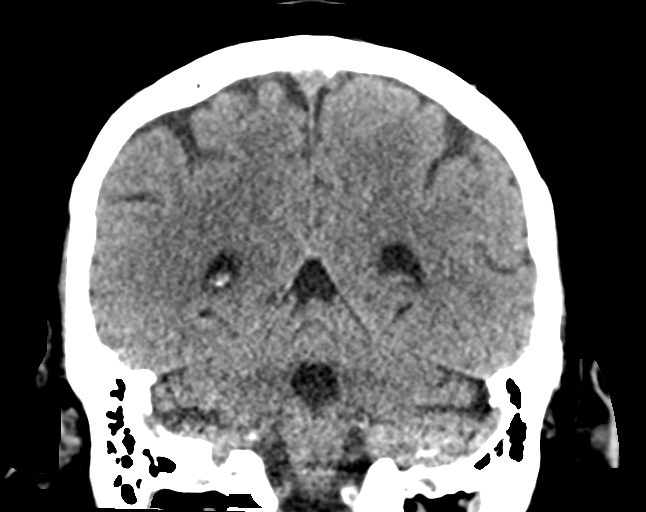
[im 27/61  brain]
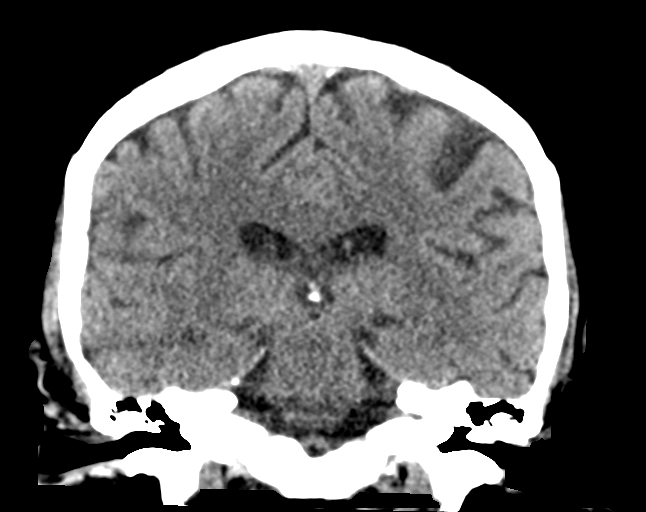
[im 34/61  brain]
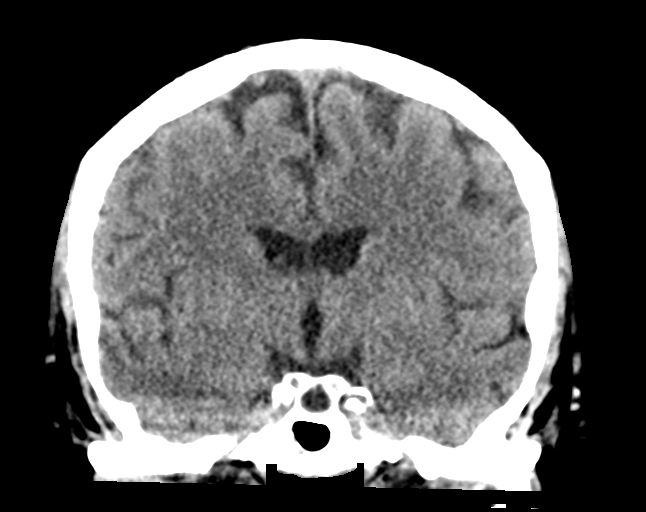

[Series 5: sagittal soft tissue · sagittal · 0.30mm/px · 3 of 56 slices shown]
[im 19/56  brain]
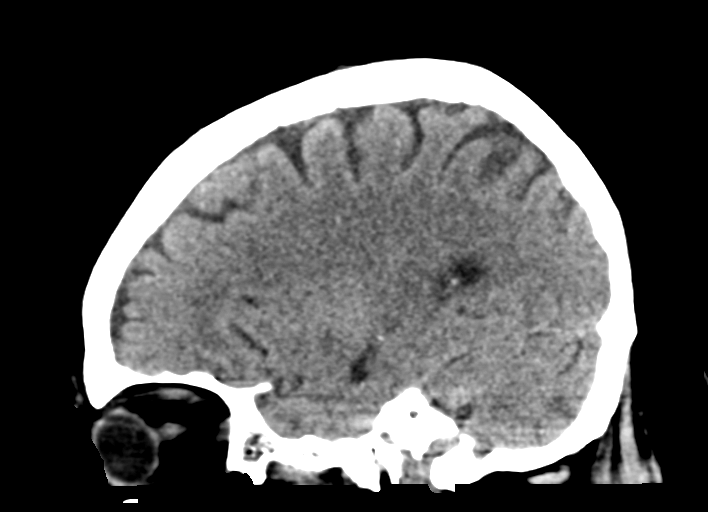
[im 28/56  brain]
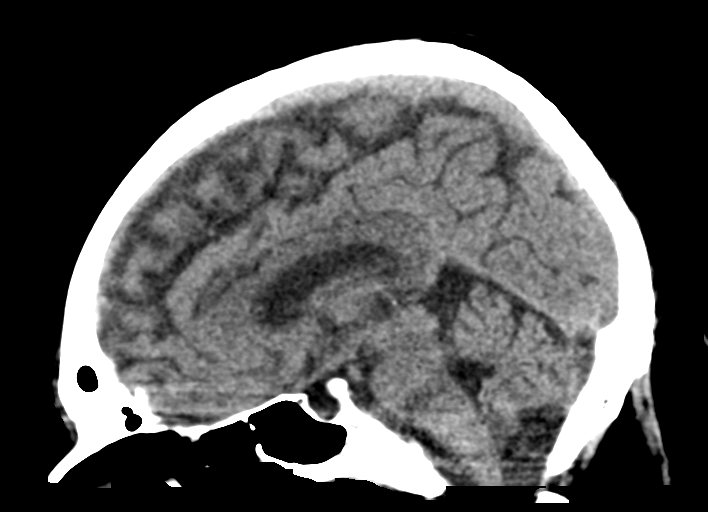
[im 37/56  brain]
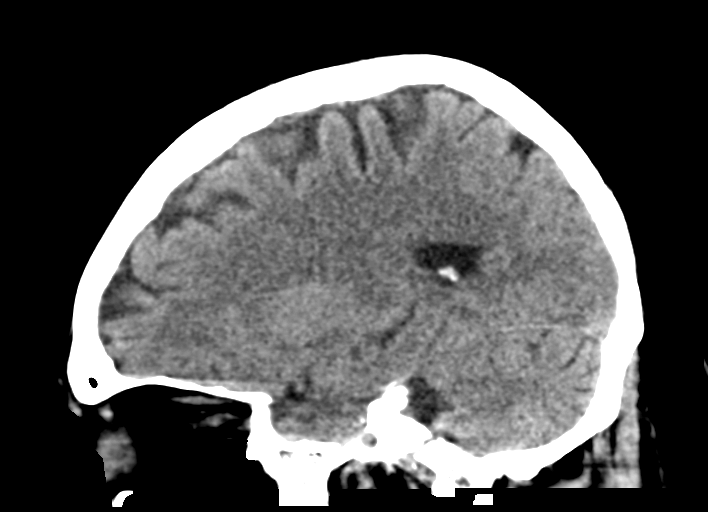

[15 of 47 positions shown; findings below may reference images not displayed]

FINDINGS: Brain: No evidence of acute infarction, hemorrhage, hydrocephalus,
extra-axial collection or mass lesion/mass effect.

Vascular: Negative for hyperdense vessel. Atherosclerotic
calcification cavernous carotid bilaterally.

Skull: Negative

Sinuses/Orbits: Negative

Other: None
IMPRESSION: Negative CT head

## 2018-11-27 IMAGING — DX DG CHEST 1V PORT
1 series · 1 of 1 positions shown · non-contrast
Comparison: 08/04/2018

CLINICAL DATA: Acute respiratory failure

EXAM:
PORTABLE CHEST 1 VIEW

[chest ap]
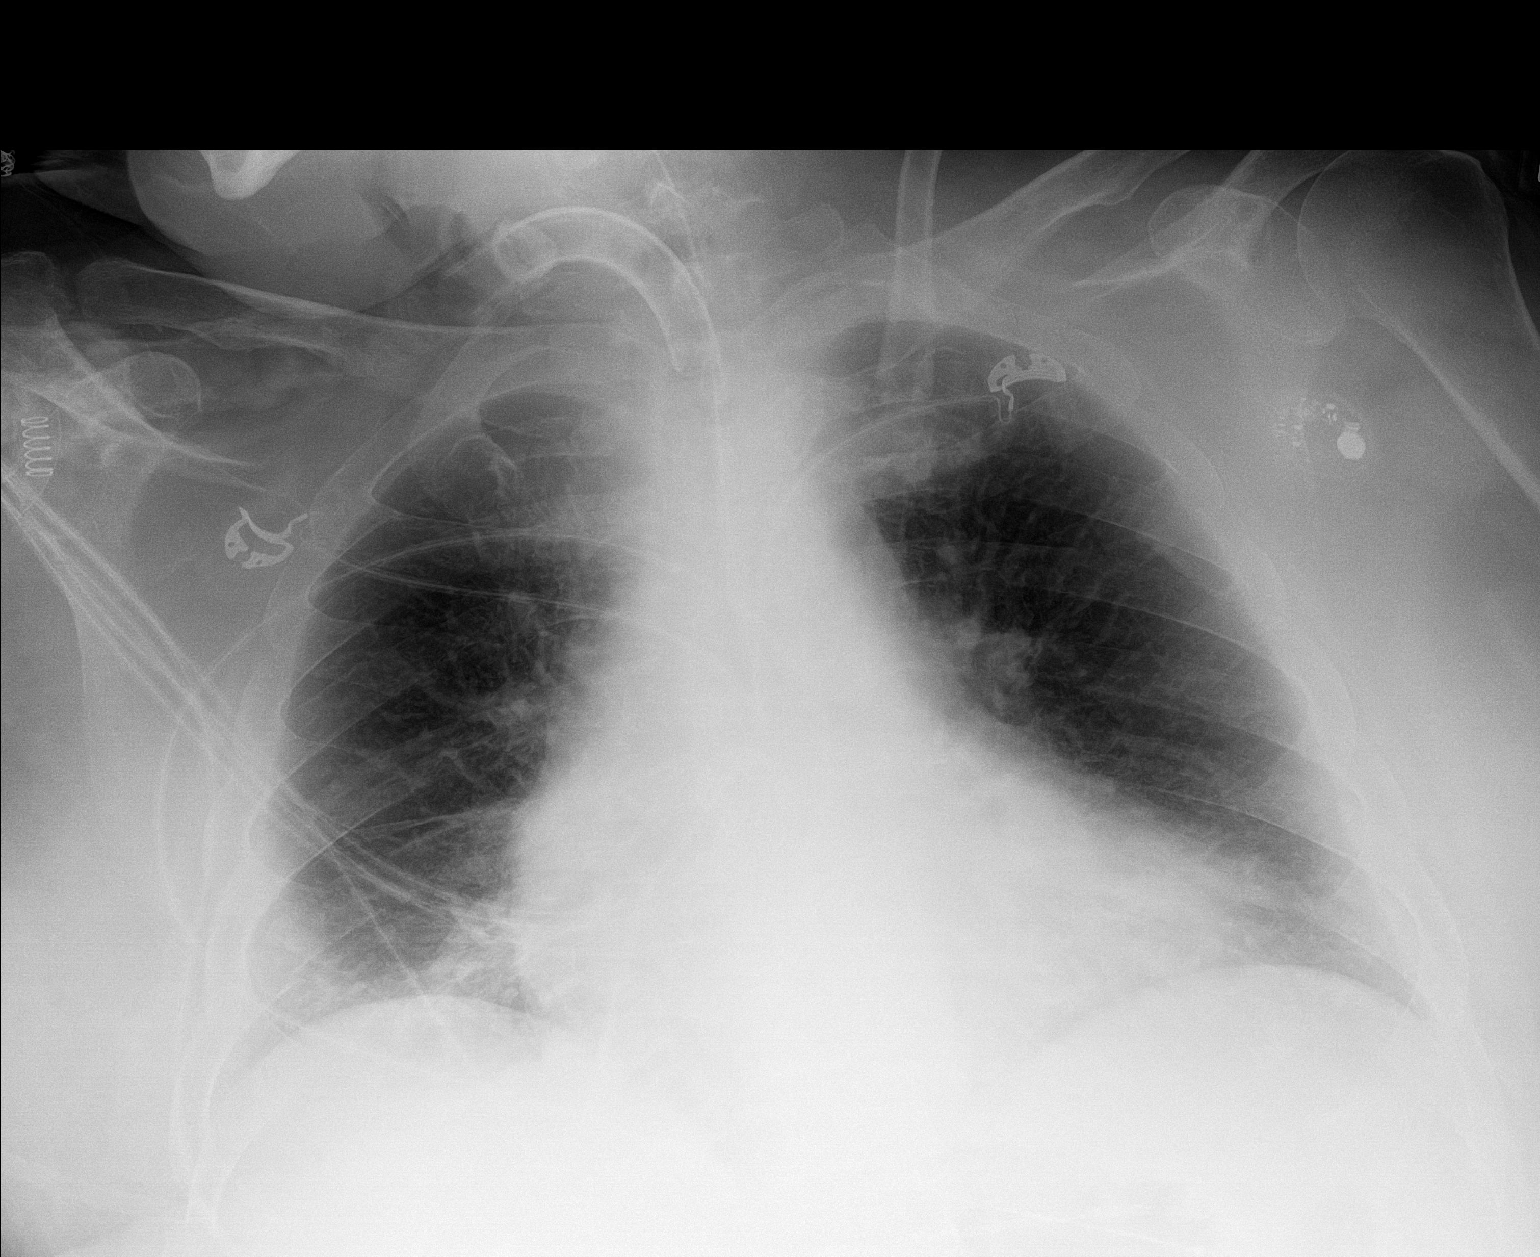

[1 of 1 positions shown; findings below may reference images not displayed]

FINDINGS: Tracheostomy and NG tube are unchanged. Cardiomegaly. Mild vascular
congestion and bibasilar opacities, likely atelectasis. No overt
edema or effusions. No acute bony abnormality.
IMPRESSION: Cardiomegaly with vascular congestion.  Mild bibasilar atelectasis.

## 2018-11-28 IMAGING — US US EXTREM LOW VENOUS BILAT
1 series · 13 of 24 positions shown · non-contrast
Comparison: None.

CLINICAL DATA: Bilateral lower extremity edema for 1 week.



[Series 1: us extrem low venous bilat · 13 of 64 slices shown]
[im 1/64]
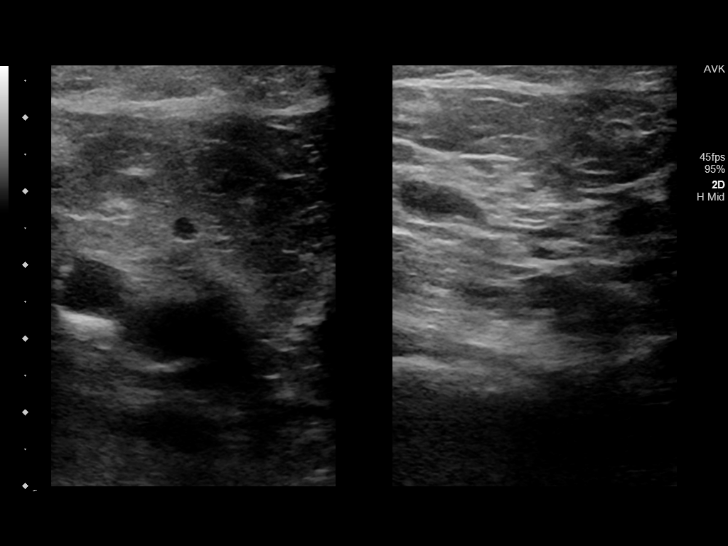
[im 6/64]
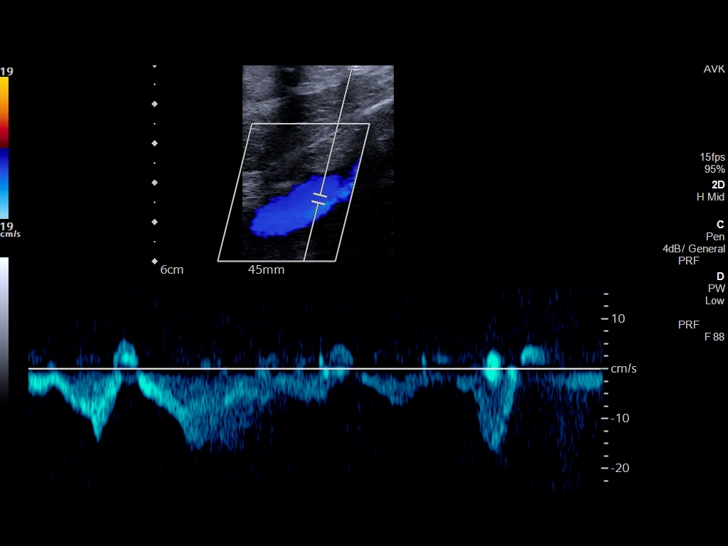
[im 11/64]
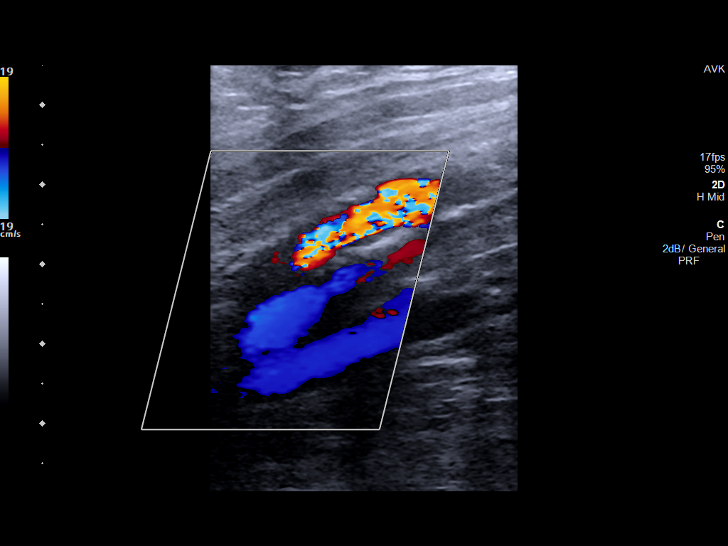
[im 17/64]
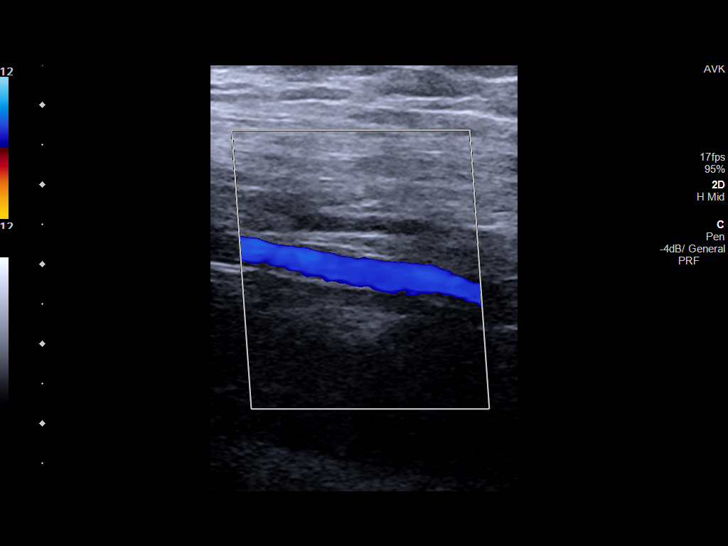
[im 22/64]
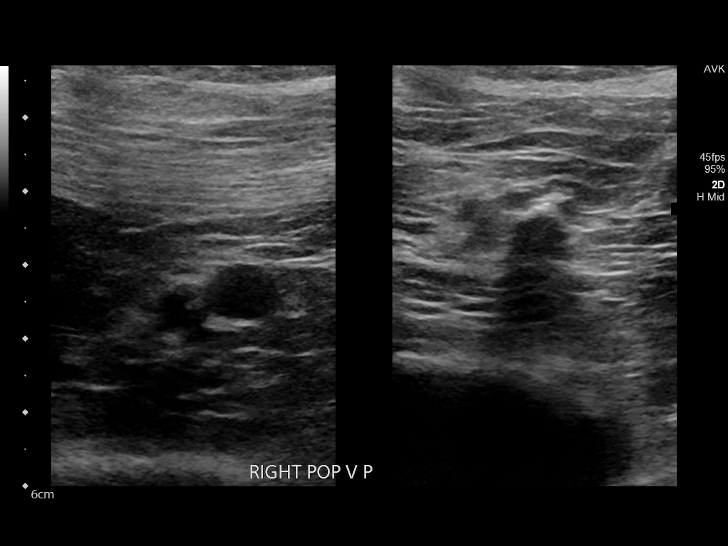
[im 28/64]
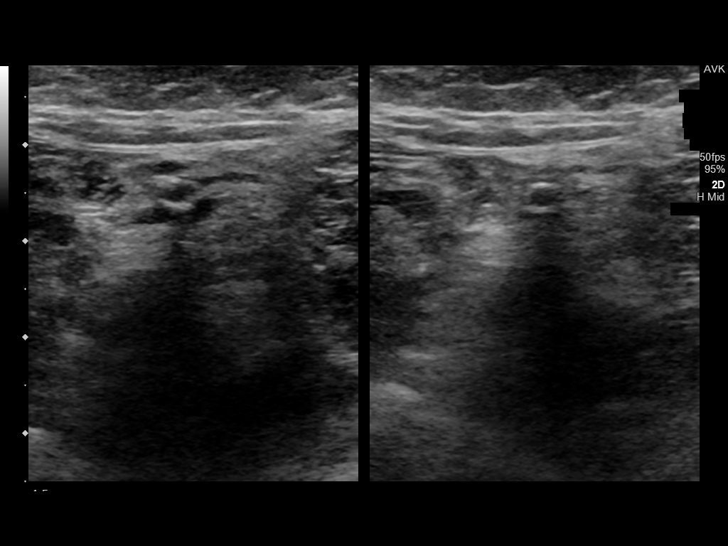
[im 33/64]
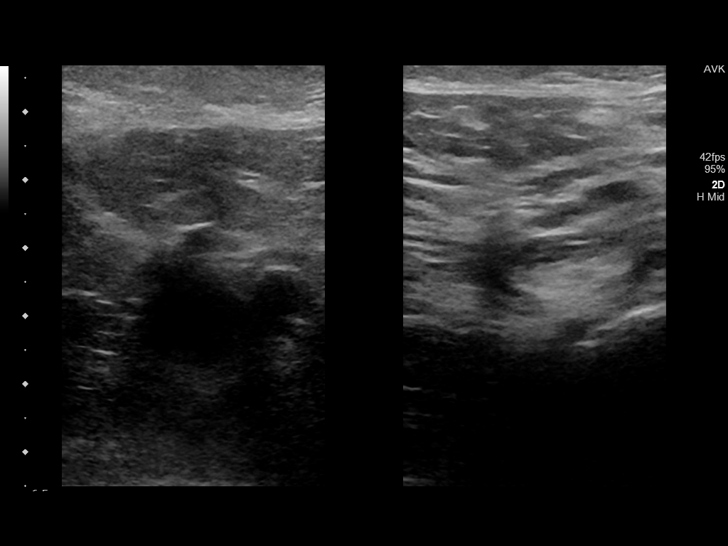
[im 36/64]
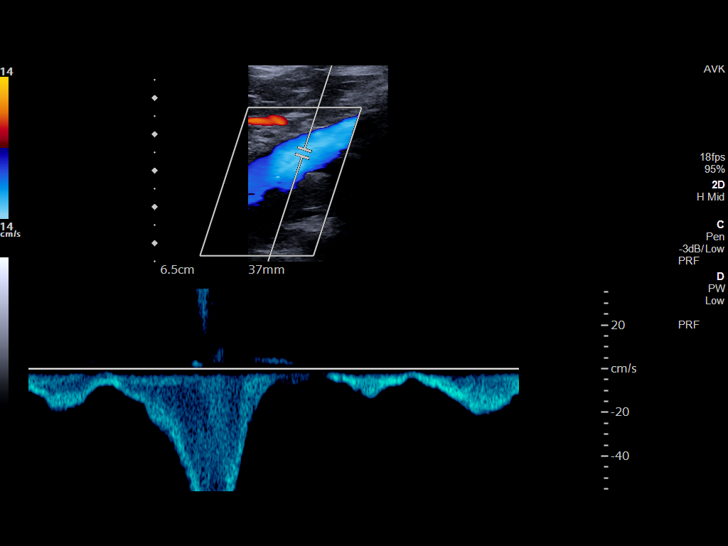
[im 42/64]
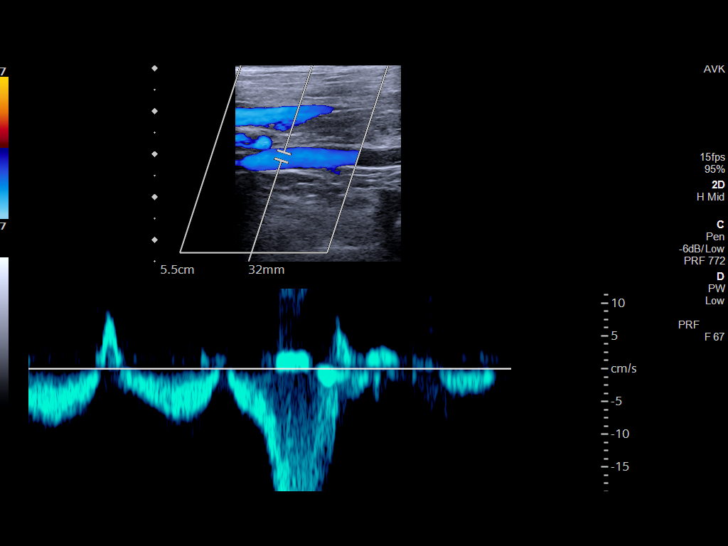
[im 47/64]
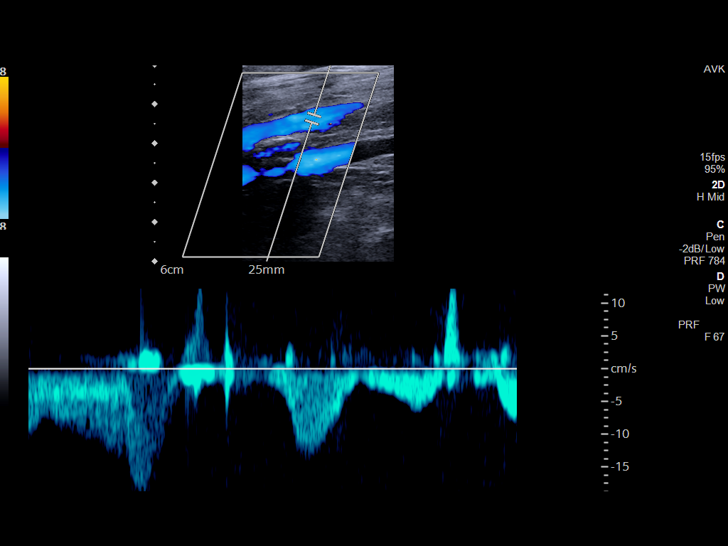
[im 53/64]
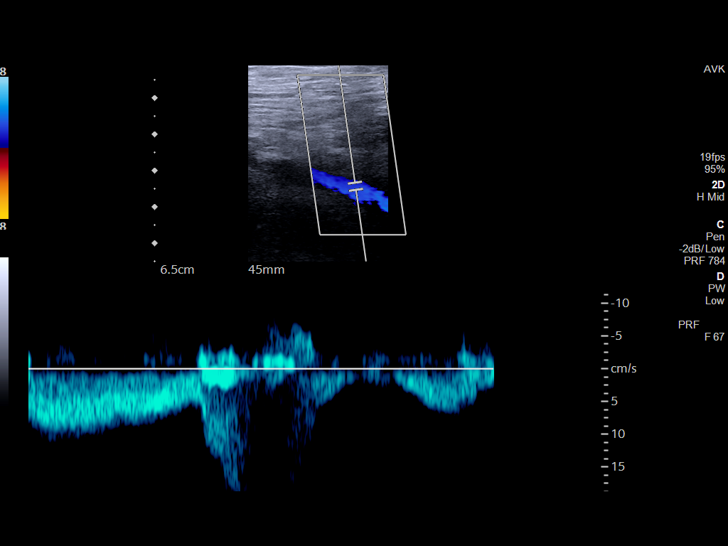
[im 58/64]
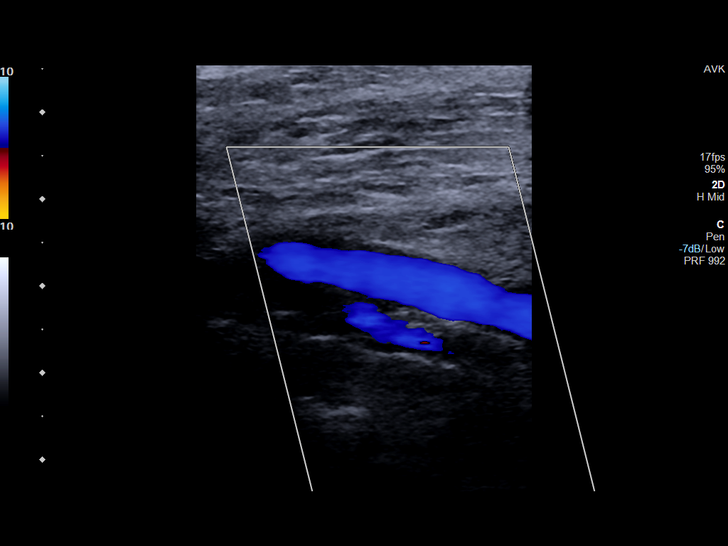
[im 64/64]
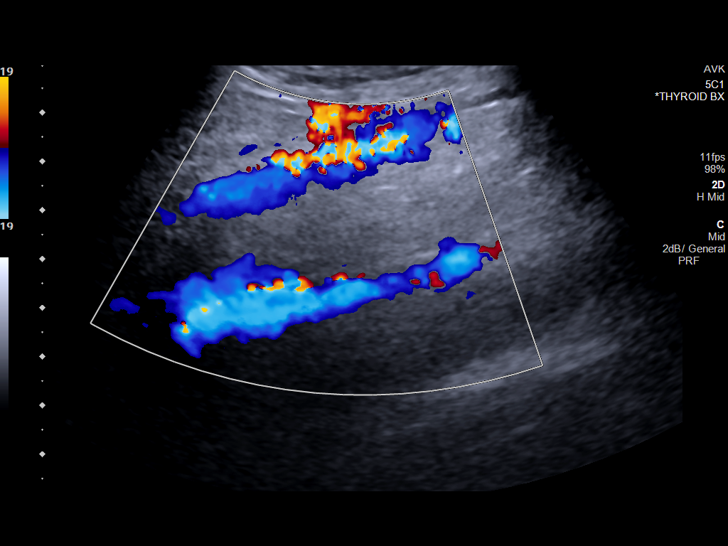

[13 of 24 positions shown; findings below may reference images not displayed]

FINDINGS: RIGHT LOWER EXTREMITY

Common Femoral Vein: No evidence of thrombus. Normal
compressibility, respiratory phasicity and response to augmentation.

Saphenofemoral Junction: No evidence of thrombus. Normal
compressibility and flow on color Doppler imaging.

Profunda Femoral Vein: No evidence of thrombus. Normal
compressibility and flow on color Doppler imaging.

Femoral Vein: No evidence of thrombus. Normal compressibility,
respiratory phasicity and response to augmentation.

Popliteal Vein: No evidence of thrombus. Normal compressibility,
respiratory phasicity and response to augmentation.

Calf Veins: No evidence of thrombus. Normal compressibility and flow
on color Doppler imaging.

Superficial Great Saphenous Vein: No evidence of thrombus. Normal
compressibility.

Venous Reflux:  None.

Other Findings: No evidence of superficial thrombophlebitis or
abnormal fluid collection.

LEFT LOWER EXTREMITY

Common Femoral Vein: No evidence of thrombus. Normal
compressibility, respiratory phasicity and response to augmentation.

Saphenofemoral Junction: No evidence of thrombus. Normal
compressibility and flow on color Doppler imaging.

Profunda Femoral Vein: No evidence of thrombus. Normal
compressibility and flow on color Doppler imaging.

Femoral Vein: No evidence of thrombus. Normal compressibility,
respiratory phasicity and response to augmentation.

Popliteal Vein: No evidence of thrombus. Normal compressibility,
respiratory phasicity and response to augmentation.

Calf Veins: No evidence of thrombus. Normal compressibility and flow
on color Doppler imaging.

Superficial Great Saphenous Vein: No evidence of thrombus. Normal
compressibility.

Venous Reflux:  None.

Other Findings: No evidence of superficial thrombophlebitis or
abnormal fluid collection.
IMPRESSION: No evidence of bilateral lower extremity deep venous thrombosis.

## 2018-11-29 IMAGING — DX DG CHEST 1V PORT
1 series · 1 of 1 positions shown · non-contrast
Comparison: 08/06/2018

CLINICAL DATA: Shortness of breath

EXAM:
PORTABLE CHEST 1 VIEW

[chest ap]
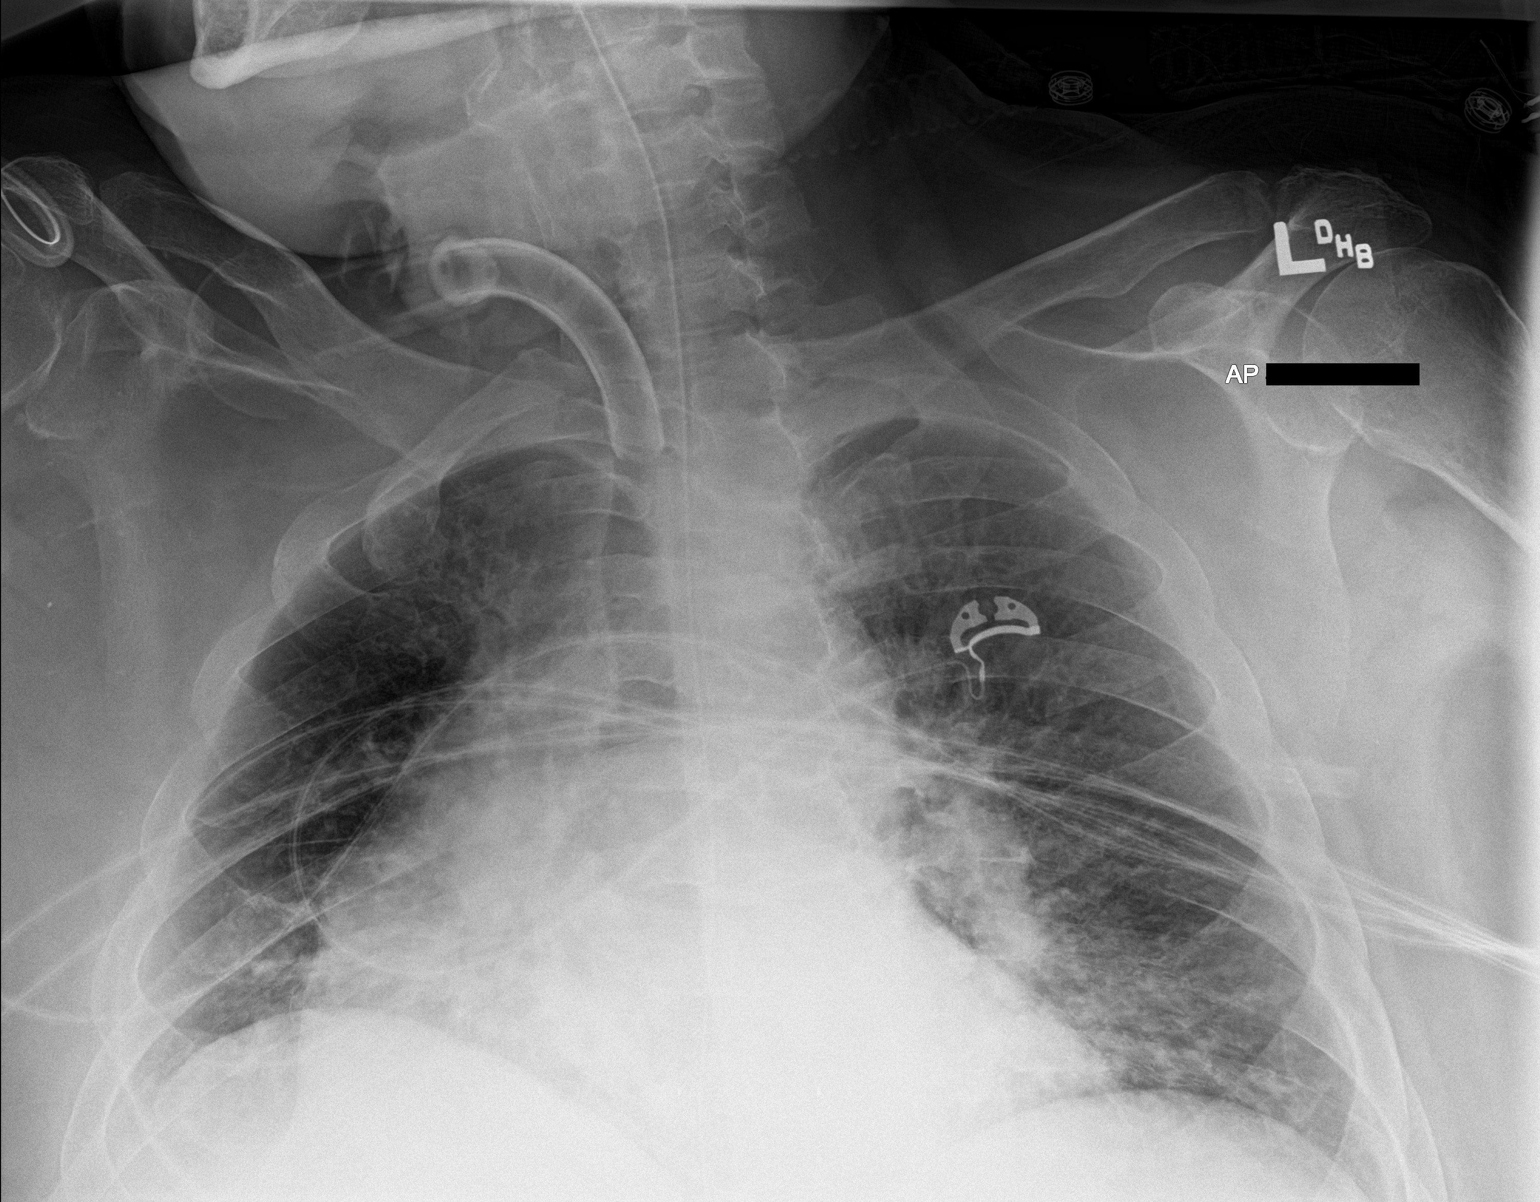

[1 of 1 positions shown; findings below may reference images not displayed]

FINDINGS: Cardiomegaly with vascular congestion and bibasilar atelectasis. Low
lung volumes. Tracheostomy and NG tube are unchanged.
IMPRESSION: Low lung volumes with cardiomegaly, vascular congestion and
bibasilar atelectasis. Findings are similar prior study.

## 2018-12-02 IMAGING — DX DG CHEST 1V PORT
1 series · 1 of 1 positions shown · non-contrast
Comparison: 08/08/2018

CLINICAL DATA: Acute respiratory failure

EXAM:
PORTABLE CHEST 1 VIEW

[chest ap]
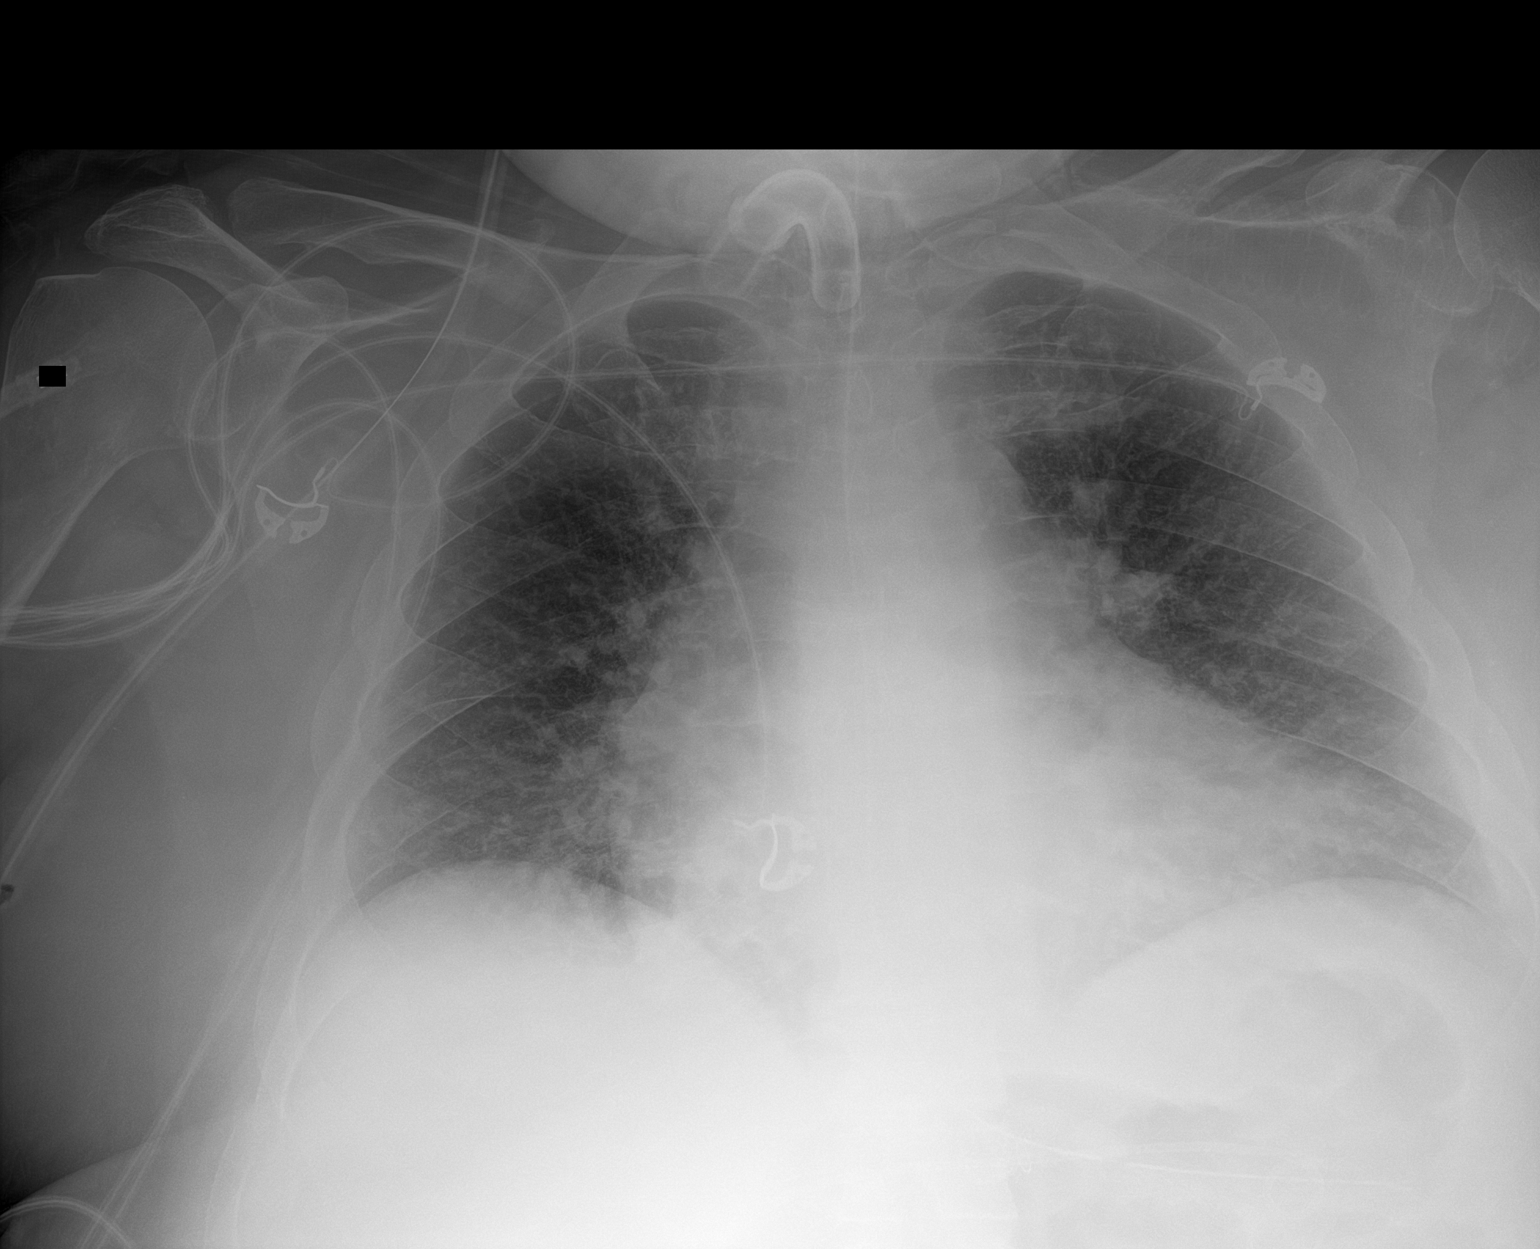

[1 of 1 positions shown; findings below may reference images not displayed]

FINDINGS: Cardiac shadow is enlarged. Tracheostomy tube and nasogastric
catheter are again seen in satisfactory position. Lungs are well
aerated with minimal bibasilar atelectatic changes. The vascular
congestion has improved. No bony abnormality is seen.
IMPRESSION: Improved vascular congestion.

Bibasilar atelectasis.
